# Patient Record
Sex: Male | Born: 1980
Health system: Southern US, Community
[De-identification: ages and names within clinical notes are randomized; demographics above are authoritative.]

## PROBLEM LIST (undated history)

## (undated) DIAGNOSIS — K922 Gastrointestinal hemorrhage, unspecified: Secondary | ICD-10-CM

## (undated) DIAGNOSIS — Z9289 Personal history of other medical treatment: Secondary | ICD-10-CM

## (undated) DIAGNOSIS — I219 Acute myocardial infarction, unspecified: Secondary | ICD-10-CM

## (undated) DIAGNOSIS — S065X9A Traumatic subdural hemorrhage with loss of consciousness of unspecified duration, initial encounter: Secondary | ICD-10-CM

## (undated) DIAGNOSIS — I2510 Atherosclerotic heart disease of native coronary artery without angina pectoris: Secondary | ICD-10-CM

## (undated) DIAGNOSIS — R519 Headache, unspecified: Secondary | ICD-10-CM

## (undated) DIAGNOSIS — E785 Hyperlipidemia, unspecified: Secondary | ICD-10-CM

## (undated) DIAGNOSIS — I10 Essential (primary) hypertension: Secondary | ICD-10-CM

## (undated) DIAGNOSIS — Z95810 Presence of automatic (implantable) cardiac defibrillator: Secondary | ICD-10-CM

## (undated) DIAGNOSIS — N200 Calculus of kidney: Secondary | ICD-10-CM

## (undated) DIAGNOSIS — I509 Heart failure, unspecified: Secondary | ICD-10-CM

## (undated) HISTORY — DX: Hyperlipidemia, unspecified: E78.5

## (undated) HISTORY — DX: Traumatic subdural hemorrhage with loss of consciousness of unspecified duration, initial encounter: S06.5X9A

## (undated) HISTORY — PX: CARDIAC CATHETERIZATION: SHX172

## (undated) HISTORY — DX: Gastrointestinal hemorrhage, unspecified: K92.2

## (undated) HISTORY — DX: Essential (primary) hypertension: I10

## (undated) HISTORY — PX: CARDIAC SURGERY: SHX584

## (undated) HISTORY — DX: Calculus of kidney: N20.0

## (undated) HISTORY — DX: Atherosclerotic heart disease of native coronary artery without angina pectoris: I25.10

---

## 2001-03-10 ENCOUNTER — Emergency Department (HOSPITAL_COMMUNITY): Admission: EM | Admit: 2001-03-10 | Discharge: 2001-03-10 | Payer: Self-pay | Admitting: Emergency Medicine

## 2004-07-05 ENCOUNTER — Emergency Department (HOSPITAL_COMMUNITY): Admission: EM | Admit: 2004-07-05 | Discharge: 2004-07-05 | Payer: Self-pay | Admitting: Emergency Medicine

## 2004-08-25 ENCOUNTER — Ambulatory Visit: Payer: Self-pay | Admitting: Family Medicine

## 2005-05-09 ENCOUNTER — Ambulatory Visit: Payer: Self-pay | Admitting: Family Medicine

## 2006-01-10 ENCOUNTER — Emergency Department (HOSPITAL_COMMUNITY): Admission: EM | Admit: 2006-01-10 | Discharge: 2006-01-11 | Payer: Self-pay | Admitting: Emergency Medicine

## 2009-09-09 ENCOUNTER — Emergency Department (HOSPITAL_COMMUNITY): Admission: EM | Admit: 2009-09-09 | Discharge: 2009-09-09 | Payer: Self-pay | Admitting: Family Medicine

## 2010-03-30 ENCOUNTER — Inpatient Hospital Stay (HOSPITAL_COMMUNITY): Admission: EM | Admit: 2010-03-30 | Discharge: 2010-03-31 | Payer: Self-pay | Admitting: Emergency Medicine

## 2010-03-30 ENCOUNTER — Encounter: Payer: Self-pay | Admitting: Cardiology

## 2010-03-30 ENCOUNTER — Ambulatory Visit: Payer: Self-pay | Admitting: Cardiology

## 2010-03-30 HISTORY — PX: OTHER SURGICAL HISTORY: SHX169

## 2010-04-07 ENCOUNTER — Ambulatory Visit: Payer: Self-pay | Admitting: Cardiology

## 2010-04-07 DIAGNOSIS — I2510 Atherosclerotic heart disease of native coronary artery without angina pectoris: Secondary | ICD-10-CM | POA: Insufficient documentation

## 2010-04-07 LAB — CONVERTED CEMR LAB
BUN: 10 mg/dL (ref 6–23)
Calcium: 9.7 mg/dL (ref 8.4–10.5)
Chloride: 105 meq/L (ref 96–112)

## 2010-04-10 ENCOUNTER — Ambulatory Visit: Payer: Self-pay | Admitting: Cardiology

## 2010-04-10 ENCOUNTER — Telehealth (INDEPENDENT_AMBULATORY_CARE_PROVIDER_SITE_OTHER): Payer: Self-pay | Admitting: *Deleted

## 2010-04-11 LAB — CONVERTED CEMR LAB
BUN: 12 mg/dL (ref 6–23)
CO2: 29 meq/L (ref 19–32)
Calcium: 9.6 mg/dL (ref 8.4–10.5)
Creatinine, Ser: 0.8 mg/dL (ref 0.4–1.5)
Glucose, Bld: 91 mg/dL (ref 70–99)

## 2010-04-13 ENCOUNTER — Ambulatory Visit: Payer: Self-pay | Admitting: Cardiology

## 2010-04-14 ENCOUNTER — Telehealth: Payer: Self-pay | Admitting: Cardiology

## 2010-04-19 ENCOUNTER — Telehealth: Payer: Self-pay | Admitting: Cardiology

## 2010-04-28 ENCOUNTER — Telehealth: Payer: Self-pay | Admitting: Cardiology

## 2010-05-02 ENCOUNTER — Telehealth (INDEPENDENT_AMBULATORY_CARE_PROVIDER_SITE_OTHER): Payer: Self-pay | Admitting: *Deleted

## 2010-05-09 ENCOUNTER — Telehealth: Payer: Self-pay | Admitting: Cardiology

## 2010-05-11 ENCOUNTER — Telehealth: Payer: Self-pay | Admitting: Cardiology

## 2010-05-15 DIAGNOSIS — S065X9A Traumatic subdural hemorrhage with loss of consciousness of unspecified duration, initial encounter: Secondary | ICD-10-CM

## 2010-05-15 DIAGNOSIS — S065XAA Traumatic subdural hemorrhage with loss of consciousness status unknown, initial encounter: Secondary | ICD-10-CM

## 2010-05-15 HISTORY — DX: Traumatic subdural hemorrhage with loss of consciousness status unknown, initial encounter: S06.5XAA

## 2010-05-15 HISTORY — DX: Traumatic subdural hemorrhage with loss of consciousness of unspecified duration, initial encounter: S06.5X9A

## 2010-05-22 ENCOUNTER — Ambulatory Visit: Payer: Self-pay | Admitting: Cardiology

## 2010-05-24 LAB — CONVERTED CEMR LAB
ALT: 34 units/L (ref 0–53)
AST: 21 units/L (ref 0–37)
Alkaline Phosphatase: 59 units/L (ref 39–117)
Bilirubin, Direct: 0.1 mg/dL (ref 0.0–0.3)
Total Bilirubin: 0.3 mg/dL (ref 0.3–1.2)

## 2010-06-15 DIAGNOSIS — K922 Gastrointestinal hemorrhage, unspecified: Secondary | ICD-10-CM

## 2010-06-15 HISTORY — DX: Gastrointestinal hemorrhage, unspecified: K92.2

## 2010-06-26 ENCOUNTER — Ambulatory Visit: Payer: Self-pay | Admitting: Cardiovascular Disease

## 2010-06-26 ENCOUNTER — Encounter: Payer: Self-pay | Admitting: Cardiology

## 2010-06-26 ENCOUNTER — Ambulatory Visit (HOSPITAL_COMMUNITY): Admission: RE | Admit: 2010-06-26 | Discharge: 2010-06-26 | Payer: Self-pay | Admitting: Cardiovascular Disease

## 2010-06-26 ENCOUNTER — Ambulatory Visit: Payer: Self-pay | Admitting: Cardiology

## 2010-06-29 ENCOUNTER — Telehealth: Payer: Self-pay | Admitting: Cardiology

## 2010-07-08 ENCOUNTER — Encounter: Payer: Self-pay | Admitting: Cardiology

## 2010-07-13 ENCOUNTER — Ambulatory Visit: Payer: Self-pay | Admitting: Cardiology

## 2010-07-13 LAB — CONVERTED CEMR LAB
Basophils Absolute: 0.1 10*3/uL (ref 0.0–0.1)
Eosinophils Absolute: 0.4 10*3/uL (ref 0.0–0.7)
Eosinophils Relative: 4.6 % (ref 0.0–5.0)
HCT: 39.5 % (ref 39.0–52.0)
Hemoglobin: 13.7 g/dL (ref 13.0–17.0)
MCHC: 34.6 g/dL (ref 30.0–36.0)
Neutro Abs: 5 10*3/uL (ref 1.4–7.7)
Platelets: 399 10*3/uL (ref 150.0–400.0)
RBC: 4.45 M/uL (ref 4.22–5.81)
RDW: 15 % — ABNORMAL HIGH (ref 11.5–14.6)
WBC: 8.4 10*3/uL (ref 4.5–10.5)

## 2010-08-14 ENCOUNTER — Encounter: Payer: Self-pay | Admitting: Cardiology

## 2010-08-17 ENCOUNTER — Telehealth: Payer: Self-pay | Admitting: Cardiology

## 2010-08-23 ENCOUNTER — Telehealth: Payer: Self-pay | Admitting: Cardiology

## 2010-08-24 ENCOUNTER — Encounter: Payer: Self-pay | Admitting: Cardiology

## 2010-09-14 ENCOUNTER — Telehealth: Payer: Self-pay | Admitting: Cardiology

## 2010-11-14 NOTE — Progress Notes (Signed)
Summary: Effient assistance program  Phone Note Outgoing Call   Call placed by: Katina Dung, RN, BSN,  April 19, 2010 4:51 PM Call placed to: Patient Summary of Call: Effient pt assistance program  Follow-up for Phone Call        I received income information on pt faxed from [employer] (pt states his mother works at [employer] [place] and he thinks she faxed the information to our office instead of [employer])--I called [employer] to check the status of the pt's application for Effient--I was told by Jillyn Hidden at [employer] that they still needed pt's income information--I was told to fax the income  information to [phone] attn: Issac-I talked with pt by telephone and got his permission to fax the income information to [employer]--I have faxed the pt's income information to [phone] attn:Issac
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
## 2010-11-14 NOTE — Letter (Signed)
Summary: [place] Medicaid & Health Choice  [place] Medicaid & Health Choice   Imported By: Marylou Mccoy 09/12/2010 14:55:54  _____________________________________________________________________  External Attachment:    Type:   Image     Comment:   External Document

## 2010-11-14 NOTE — Progress Notes (Signed)
Summary: patient assistance form  Phone Note From Other Clinic   Caller: Marcello Moores Summary of Call: Per Marcello Moores from [facility] patient assistance concering pt effient [phone] refer # [phone] Initial call taken by: Edman Circle,  April 14, 2010 12:16 PM  Follow-up for Phone Call        Spoke with a rep. from [facility] patient assistance program for Effient. Pt. needs to fax a pruff of income to them. I called pt . Patient states he spoke the  [place] rep. a few minutes earlier. Information needed was faxed to them  by  pt. Follow-up by: Ollen Gross, RN, BSN,  April 14, 2010 12:46 PM

## 2010-11-14 NOTE — Letter (Signed)
Summary: Cardiac Rehab Program  Cardiac Rehab Program   Imported By: Marylou Mccoy 09/15/2010 12:47:38  _____________________________________________________________________  External Attachment:    Type:   Image     Comment:   External Document

## 2010-11-14 NOTE — Progress Notes (Signed)
Summary: Effient 10mg  #30 x4  Phone Note Outgoing Call   Call placed by: Katina Dung, RN, BSN,  September 14, 2010 2:14 PM Call placed to: Patient Summary of Call: Effient   Follow-up for Phone Call        recieved Effient from [facility]--Effient 10mg  #30 tabs x 4 bottles--LMTCB for wife --medication left at front desk for pt pickup--I talked with pt --he is aware Effient at front desk for him to pickup [employer]

## 2010-11-14 NOTE — Miscellaneous (Signed)
Summary: [employer] Release Form   Imported By: Roderic Ovens 05/25/2010 15:44:34  _____________________________________________________________________  External Attachment:    Type:   Image     Comment:   External Document
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
## 2010-11-14 NOTE — Progress Notes (Signed)
  Pt's wife Larita Fife) brought in paper from [employer], I have sent to [facility] for processing. Cala Bradford Mesiemore  May 02, 2010 10:32 AM     Appended Document:  Luster Landsberg Optometrist) is going to complete the [employer] papers and send back with Almira Coaster, I called Mr.Krog and let him know the paper may not be ready until tomorrow. He ok'd will call and let hom know when ready.  Appended Document:  Called Pt @ 9:45 am today to let him know Paperwork is ready for Pick-up.( [employer] papers,[employer]) he ok'd   Appended Document:  Pt picked up paperwork..Km  Appended Document:  Pt wife called back with Master card # to pay fee for [employer].sending paper over to [facility])

## 2010-11-14 NOTE — Progress Notes (Signed)
Summary: pt reurning call  Phone Note Call from Patient Call back at [phone]   Caller: Spouse/Lynn Summary of Call: Pt returning call Initial call taken by: Judie Grieve,  June 29, 2010 2:30 PM  Follow-up for Phone Call        talked with wife Larita Fife about echo results

## 2010-11-14 NOTE — Progress Notes (Signed)
Summary: rtn to work letter  Phone Note Call from Patient   Caller: Spouse Reason for Call: Talk to Nurse Summary of Call: pt needs letter from [place] stating pt was released back to work on 05-01-10 with no retrictions-att:dr [employer] [employer]  fax [phone] Initial call taken by: Glynda Jaeger,  May 11, 2010 9:38 AM  Follow-up for Phone Call        Faxed note as requested. Follow-up by: J REISS RN     Appended Document: rtn to work letter Let's get this to him.

## 2010-11-14 NOTE — Letter (Signed)
Summary: Generic Letter  Architectural technologist, Main Office  [address]   [place], [place] [address]   Phone: [phone]  Fax: [phone]        April 13, 2010 MRN: [id number]    [name] [address] [place], [place]  [address]    To Whom It May Concern:  [name] can return to work July 18,2011 without restrictions.         Sincerely,     Nel Stoneking,MD  This letter has been electronically signed by your physician.  Appended Document: Generic Letter Faxed note to [employer] to  [phone].

## 2010-11-14 NOTE — Progress Notes (Signed)
Summary: SHort term disability form  Phone Note Other Incoming      Patient completed walk in patient form. Received from front desk. Forwarded to [employer] for processing.

## 2010-11-14 NOTE — Letter (Signed)
Summary: [facility] Patient Asisstance Program  [facility] Patient Asisstance [facility]   Imported By: Marylou Mccoy 09/12/2010 14:59:39  _____________________________________________________________________  External Attachment:    Type:   Image     Comment:   External Document

## 2010-11-14 NOTE — Progress Notes (Signed)
Summary: effient   Phone Note Outgoing Call   Call placed by: Meredith Staggers, RN,  April 28, 2010 4:22 PM Call placed to: Patient Summary of Call: received pts Effient from [employer], called pt and left mess on ID VM that it had arrived and would be at front desk for pt to p/u

## 2010-11-14 NOTE — Progress Notes (Signed)
Summary: CHECKING ON PAPER WORK  Phone Note Call from Patient Call back at Home Phone [phone] Call back at [phone]   Caller: Spouse/ lYNN Summary of Call: PT WIFE CHECKING PAPER WORK Initial call taken by: Judie Grieve,  May 09, 2010 9:33 AM  Follow-up for Phone Call        talked with [facility] signed by Dr Shirlee Latch 05/03/10--forwarded wife to medical records to discuss picking up a copy of the paperwork

## 2010-11-14 NOTE — Letter (Signed)
Summary: [facility]   [facility]   Imported By: Roderic Ovens 07/25/2010 09:07:53  _____________________________________________________________________  External Attachment:    Type:   Image     Comment:   External Document

## 2010-11-14 NOTE — Assessment & Plan Note (Signed)
Summary: eph/jss   CC:  eph/pt had MI.  Pt and spouse report that his energy levels have been very low lately and he gives out easily.  Marland Kitchen  History of Present Illness: 30 yo with history of recent inferior MI presents for followup.  Patient went to the ER in 6/11 with chest pain.  He was noted to have inferior MI and underwent cath showing totally occluded RCA with long 70% LAD stenosis.  Patient had PCI with 2 PROMUS DES, one to the LAD and the other to the RCA.  Since discharge, he has been doing reasonably well with no recurrent chest pain.  He is walking for exercise.  He does get short of breath after walking about 10-15 minutes.  He does not want to do cardiac rehab.  No orthopnea/PND.  He was bradycardic in the hospital and was not started on a beta blocker.  HR today, however, is in the 70s.  He plans to get married in a week and wants to go back to work in about 2 weeks after his honeymoon.  He is still smoking about 1 ppd but is cutting back (2 ppd prior).   ECG: NSR, old inferior MI  Labs (6/11): K 5.2, creatinine 0.8, LDL 98, HDL 32  Current Medications (verified): 1)  Nitrostat 0.4 Mg Subl (Nitroglycerin) .... Uad 2)  Buffered Aspirin 325 Mg Tabs (Aspirin Buf(Cacarb-Mgcarb-Mgo)) .... Take One Tablet Once Daily 3)  Effient 10 Mg Tabs (Prasugrel Hcl) .... Take One Tablet Once Daily 4)  Lisinopril 5 Mg Tabs (Lisinopril) .... Take 1/2 Tablet Once A Day 5)  Crestor 40 Mg Tabs (Rosuvastatin Calcium) .... Take One Tablet Once Daily  Allergies (verified): 1)  ! Pcn  Past History:  Past Medical History: 1. 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 LHC  with EF 55% and basal to mid inferior hypokinesis.  Totally occluded RCA with weak left to right collaterals.  50% ostial OM1.  Long 70% proximal LAD.  Patient had PROMUS DES to LAD and RCA.  2. Active smoker.   Family History: Noncontributory for early coronary artery disease or myocardial infarction.  His father does have a pacemaker.   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 Now about 1 ppd.  Lives in [place].  Alcohol Use - yes-occasional No illegal drugs.   Review of Systems       All systems reviewed and negative except as per HPI.   Vital Signs:  Patient profile:   30 year old male Height:      69 inches Weight:      231 pounds BMI:     34.24 Pulse rate:   74 / minute Pulse rhythm:   regular BP sitting:   128 / 74  (left arm) Cuff size:   large  Vitals Entered By: Judithe Modest CMA (April 13, 2010 1:43 PM)  Physical Exam  General:  Well developed, well nourished, in no acute distress. Head:  normocephalic and atraumatic Nose:  no deformity, discharge, inflammation, or lesions Mouth:  Teeth, gums and palate normal. Oral mucosa normal. Neck:  Neck supple, no JVD. No masses, thyromegaly or abnormal cervical nodes. Lungs:  Clear bilaterally to auscultation and percussion. Heart:  Non-displaced PMI, chest non-tender; regular rate and rhythm, S1, S2 without murmurs, rubs or gallops. Carotid upstroke normal, no bruit. Pedals normal pulses. No edema, no varicosities. Abdomen:  Bowel sounds positive; abdomen soft and non-tender without masses, organomegaly, or hernias noted. No  hepatosplenomegaly. Msk:  Back normal, normal gait. Muscle strength and tone normal. Extremities:  No clubbing or cyanosis. Neurologic:  Alert and oriented x 3. Skin:  Intact without lesions or rashes. Psych:  Normal affect.   Impression & Recommendations:  Problem # 1:  CORONARY ATHEROSCLEROSIS NATIVE CORONARY ARTERY (ICD-414.01) Stable s/p inferior MI.  Still with some exertional dyspnea but no chest pain.  His early age with MI is difficult to explain.  He does smoke but does not have a premature family history of CAD and cholesterol was not high. Drug screen was negative.  Continue ASA, Effient, Crestor, lisinopril.  - Continue exercise.  - OK to return to work in  2 weeks.  - WIth HR in 70s, will start low dose Coreg (3.125 mg two times a day).  - Lipids/LFTs in 6 weeks.  - Echo in 2 months (EF was normal on LV-gram).   Problem # 2:  SMOKING Patient was strongly encouraged to quit.  He is cutting back and wants to do it on his own without pharmacological aid.    Other Orders: EKG w/ Interpretation (93000) Echocardiogram (Echo)  Patient Instructions: 1)  Your physician has recommended you make the following change in your medication:  2)  Start Coreg(carvedilol) 3.125mg  twice a  day 3)  Your physician recommends that you return for a FASTING lipid profile/liver profile in 6 weeks--414.01 4)  Your physician has requested that you have an echocardiogram.  Echocardiography is a painless test that uses sound waves to create images of your heart. It provides your doctor with information about the size and shape of your heart and how well your heart's chambers and valves are working.  This procedure takes approximately one hour. There are no restrictions for this procedure. IN 2 MONTHS 5)  Your physician recommends that you schedule a follow-up appointment in: 2 months with  Dr Shirlee Latch after the echo has been done in 2 months. Prescriptions: COREG 3.125 MG TABS (CARVEDILOL) one twice a day  #60 x 11   Entered by:   Katina Dung, RN, BSN   Authorized by:   Marca Ancona, MD   Signed by:   Katina Dung, RN, BSN on 04/13/2010   Method used:   Electronically to        [facility]  [phone]* (retail)       [address]       [place], [place]  [address]       Ph: [phone] or [phone]       Fax: [phone]   RxID:   [id number]

## 2010-11-14 NOTE — Progress Notes (Signed)
Summary: restricition in details for work**rtn call w/fax**  Phone Note Call from Patient Call back at Home Phone [phone]   Caller: Norma Fredrickson [id number] Reason for Call: Talk to Nurse Summary of Call: pt need a letter stating his restriction in details  for work. pt went to work on 7/18.  Initial call taken by: Lorne Skeens,  August 17, 2010 9:58 AM  Follow-up for Phone Call        spouse to get fax number for pt work and will refax previous letter.   letter faxed. Whitney Maeola Sarah RN  August 17, 2010 3:21 PM  Follow-up by: Claris Gladden RN,  August 17, 2010 10:05 AM  Additional Follow-up for Phone Call Additional follow up Details #1::        rtn call fax#[phone] Attn: [facility] & Guinevere Scarlet  August 17, 2010 2:28 PM

## 2010-11-30 ENCOUNTER — Inpatient Hospital Stay (HOSPITAL_COMMUNITY)
Admission: EM | Admit: 2010-11-30 | Discharge: 2010-12-03 | DRG: 551 | Disposition: A | Discharge status: Home / Self Care | Payer: BC Managed Care – PPO | Attending: Internal Medicine | Admitting: Internal Medicine

## 2010-11-30 ENCOUNTER — Encounter: Payer: Self-pay | Admitting: Cardiology

## 2010-11-30 DIAGNOSIS — K228 Other specified diseases of esophagus: Secondary | ICD-10-CM | POA: Diagnosis present

## 2010-11-30 DIAGNOSIS — K2289 Other specified disease of esophagus: Secondary | ICD-10-CM | POA: Diagnosis present

## 2010-11-30 DIAGNOSIS — K208 Other esophagitis without bleeding: Principal | ICD-10-CM | POA: Diagnosis present

## 2010-11-30 DIAGNOSIS — I252 Old myocardial infarction: Secondary | ICD-10-CM

## 2010-11-30 DIAGNOSIS — Z7982 Long term (current) use of aspirin: Secondary | ICD-10-CM

## 2010-11-30 DIAGNOSIS — F17200 Nicotine dependence, unspecified, uncomplicated: Secondary | ICD-10-CM | POA: Diagnosis present

## 2010-11-30 DIAGNOSIS — D62 Acute posthemorrhagic anemia: Secondary | ICD-10-CM | POA: Diagnosis present

## 2010-11-30 DIAGNOSIS — I2510 Atherosclerotic heart disease of native coronary artery without angina pectoris: Secondary | ICD-10-CM | POA: Diagnosis present

## 2010-11-30 DIAGNOSIS — E875 Hyperkalemia: Secondary | ICD-10-CM | POA: Diagnosis present

## 2010-11-30 DIAGNOSIS — Z7902 Long term (current) use of antithrombotics/antiplatelets: Secondary | ICD-10-CM

## 2010-11-30 DIAGNOSIS — K219 Gastro-esophageal reflux disease without esophagitis: Secondary | ICD-10-CM | POA: Diagnosis present

## 2010-11-30 DIAGNOSIS — Z87820 Personal history of traumatic brain injury: Secondary | ICD-10-CM

## 2010-11-30 DIAGNOSIS — Z880 Allergy status to penicillin: Secondary | ICD-10-CM

## 2010-11-30 DIAGNOSIS — Z9861 Coronary angioplasty status: Secondary | ICD-10-CM

## 2010-11-30 LAB — COMPREHENSIVE METABOLIC PANEL
ALT: 21 U/L (ref 0–53)
Albumin: 3.8 g/dL (ref 3.5–5.2)
Alkaline Phosphatase: 47 U/L (ref 39–117)
CO2: 17 mEq/L — ABNORMAL LOW (ref 19–32)
Calcium: 8.8 mg/dL (ref 8.4–10.5)
GFR calc Af Amer: >60 mL/min (ref >60)
GFR calc non Af Amer: >60 mL/min (ref >60)
Total Protein: 6.8 g/dL (ref 6.0–8.3)

## 2010-11-30 LAB — CBC
HCT: 39.5 % (ref 39.0–52.0)
HCT: 35.2 % — ABNORMAL LOW (ref 39.0–52.0)
Hemoglobin: 14 g/dL (ref 13.0–17.0)
MCH: 30.4 pg (ref 26.0–34.0)
MCH: 29.8 pg (ref 26.0–34.0)
MCHC: 35.4 g/dL (ref 30.0–36.0)
MCHC: 34.7 g/dL (ref 30.0–36.0)
MCV: 86.1 fL (ref 78.0–100.0)
Platelets: 408 10*3/uL — ABNORMAL HIGH (ref 150–400)
Platelets: 336 10*3/uL (ref 150–400)
RDW: 13.2 % (ref 11.5–15.5)

## 2010-11-30 LAB — TROPONIN I: Troponin I: 0.05 ng/mL (ref 0.00–0.06)

## 2010-11-30 LAB — BASIC METABOLIC PANEL
CO2: 23 mEq/L (ref 19–32)
Calcium: 8.5 mg/dL (ref 8.4–10.5)
Chloride: 106 mEq/L (ref 96–112)
GFR calc Af Amer: >60 mL/min (ref >60)
Potassium: 4.9 mEq/L (ref 3.5–5.1)
Sodium: 136 mEq/L (ref 135–145)

## 2010-11-30 LAB — OCCULT BLOOD, POC DEVICE: Fecal Occult Bld: POSITIVE

## 2010-11-30 LAB — DIFFERENTIAL
Eosinophils Relative: 1 % (ref 0–5)
Monocytes Absolute: 0.9 10*3/uL (ref 0.1–1.0)
Monocytes Relative: 6 % (ref 3–12)
Neutro Abs: 11 10*3/uL — ABNORMAL HIGH (ref 1.7–7.7)

## 2010-11-30 LAB — POCT I-STAT, CHEM 8
BUN: 48 mg/dL — ABNORMAL HIGH (ref 6–23)
Chloride: 110 mEq/L (ref 96–112)

## 2010-11-30 LAB — CK TOTAL AND CKMB (NOT AT ARMC): Relative Index: 1.1 (ref 0.0–2.5)

## 2010-11-30 LAB — ABO/RH: ABO/RH(D): O POS

## 2010-12-01 LAB — CARDIAC PANEL(CRET KIN+CKTOT+MB+TROPI)
CK, MB: 1.7 ng/mL (ref 0.3–4.0)
CK, MB: 1.5 ng/mL (ref 0.3–4.0)
Relative Index: 0.9 (ref 0.0–2.5)
Relative Index: 1 (ref 0.0–2.5)
Total CK: 153 U/L (ref 7–232)

## 2010-12-01 LAB — BASIC METABOLIC PANEL
BUN: 20 mg/dL (ref 6–23)
CO2: 25 mEq/L (ref 19–32)
GFR calc Af Amer: >60 mL/min (ref >60)
Glucose, Bld: 93 mg/dL (ref 70–99)
Potassium: 4.4 mEq/L (ref 3.5–5.1)
Sodium: 138 mEq/L (ref 135–145)

## 2010-12-01 LAB — CBC
MCH: 29.4 pg (ref 26.0–34.0)
RDW: 13.4 % (ref 11.5–15.5)
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
## 2010-12-02 LAB — CBC
MCH: 30.9 pg (ref 26.0–34.0)
Platelets: 268 10*3/uL (ref 150–400)
RBC: 2.75 MIL/uL — ABNORMAL LOW (ref 4.22–5.81)
RDW: 13.2 % (ref 11.5–15.5)

## 2010-12-03 LAB — CROSSMATCH
Unit division: 0
Unit division: 0

## 2010-12-03 LAB — CBC
HCT: 29.2 % — ABNORMAL LOW (ref 39.0–52.0)
MCHC: 34.2 g/dL (ref 30.0–36.0)
Platelets: 242 10*3/uL (ref 150–400)
RDW: 13.7 % (ref 11.5–15.5)
WBC: 8 10*3/uL (ref 4.0–10.5)
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
## 2010-12-25 ENCOUNTER — Ambulatory Visit: Payer: BC Managed Care – PPO | Admitting: Cardiology

## 2010-12-26 NOTE — Consult Note (Signed)
Summary: [facility] Consultation Report  [facility] Consultation Report   Imported By: Earl Many 12/18/2010 17:17:49  _____________________________________________________________________  External Attachment:    Type:   Image     Comment:   External Document

## 2010-12-31 LAB — RAPID URINE DRUG SCREEN, HOSP PERFORMED
Amphetamines: NOT DETECTED
Cocaine: NOT DETECTED
Opiates: POSITIVE — AB
Tetrahydrocannabinol: NOT DETECTED

## 2010-12-31 LAB — CARDIAC PANEL(CRET KIN+CKTOT+MB+TROPI)
CK, MB: 14.4 ng/mL (ref 0.3–4.0)
CK, MB: 22.4 ng/mL (ref 0.3–4.0)
CK, MB: 30.8 ng/mL (ref 0.3–4.0)
Relative Index: 6.9 — ABNORMAL HIGH (ref 0.0–2.5)
Relative Index: 7.9 — ABNORMAL HIGH (ref 0.0–2.5)
Total CK: 243 U/L — ABNORMAL HIGH (ref 7–232)
Total CK: 324 U/L — ABNORMAL HIGH (ref 7–232)
Troponin I: 3.46 ng/mL (ref 0.00–0.06)

## 2010-12-31 LAB — POCT CARDIAC MARKERS: Myoglobin, poc: 287 ng/mL (ref 12–200)

## 2010-12-31 LAB — DIFFERENTIAL
Eosinophils Absolute: 0.3 10*3/uL (ref 0.0–0.7)
Eosinophils Relative: 3 % (ref 0–5)
Lymphocytes Relative: 28 % (ref 12–46)
Lymphs Abs: 2.7 10*3/uL (ref 0.7–4.0)
Monocytes Relative: 8 % (ref 3–12)

## 2010-12-31 LAB — CBC
HCT: 38.3 % — ABNORMAL LOW (ref 39.0–52.0)
HCT: 42.5 % (ref 39.0–52.0)
Hemoglobin: 13.2 g/dL (ref 13.0–17.0)
Hemoglobin: 14.6 g/dL (ref 13.0–17.0)
MCV: 90.1 fL (ref 78.0–100.0)
Platelets: 254 10*3/uL (ref 150–400)
RBC: 4.26 MIL/uL (ref 4.22–5.81)
RDW: 13.3 % (ref 11.5–15.5)
WBC: 13.9 10*3/uL — ABNORMAL HIGH (ref 4.0–10.5)
WBC: 9.7 10*3/uL (ref 4.0–10.5)

## 2010-12-31 LAB — BASIC METABOLIC PANEL
Calcium: 8.3 mg/dL — ABNORMAL LOW (ref 8.4–10.5)
Calcium: 8.5 mg/dL (ref 8.4–10.5)
GFR calc Af Amer: >60 mL/min (ref >60)
GFR calc non Af Amer: >60 mL/min (ref >60)
GFR calc non Af Amer: >60 mL/min (ref >60)
Glucose, Bld: 121 mg/dL — ABNORMAL HIGH (ref 70–99)
Potassium: 3.4 mEq/L — ABNORMAL LOW (ref 3.5–5.1)
Potassium: 3.8 mEq/L (ref 3.5–5.1)
Sodium: 136 mEq/L (ref 135–145)
Sodium: 137 mEq/L (ref 135–145)

## 2010-12-31 LAB — LIPID PANEL
HDL: 32 mg/dL — ABNORMAL LOW (ref >39)
LDL Cholesterol: 98 mg/dL (ref 0–99)
Total CHOL/HDL Ratio: 5.1 RATIO
VLDL: 33 mg/dL (ref 0–40)

## 2010-12-31 LAB — CK TOTAL AND CKMB (NOT AT ARMC)
CK, MB: 15.4 ng/mL (ref 0.3–4.0)
Relative Index: 6.6 — ABNORMAL HIGH (ref 0.0–2.5)

## 2010-12-31 LAB — TROPONIN I: Troponin I: 0.56 ng/mL (ref 0.00–0.06)

## 2010-12-31 LAB — C-REACTIVE PROTEIN: CRP: 0.4 mg/dL — ABNORMAL LOW (ref <0.6)
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
## 2011-01-30 ENCOUNTER — Encounter: Payer: Self-pay | Admitting: Cardiology

## 2011-03-30 ENCOUNTER — Telehealth: Payer: Self-pay | Admitting: Cardiology

## 2011-03-30 NOTE — Telephone Encounter (Signed)
Per pt wife calling would like to be set up for stress test.

## 2011-03-30 NOTE — Telephone Encounter (Signed)
Have him come to office.  We will see if he needs a stress test and arrange.
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
## 2011-04-02 NOTE — Telephone Encounter (Signed)
LMTCB for pt's wife. 

## 2011-04-05 NOTE — Telephone Encounter (Signed)
I talked with pt. I have given pt an appt with Dr Shirlee Latch 05/01/11. Pt states his DOT physical is scheduled for 05/11/11.

## 2011-04-26 ENCOUNTER — Encounter: Payer: Self-pay | Admitting: Cardiology

## 2011-05-01 ENCOUNTER — Ambulatory Visit (INDEPENDENT_AMBULATORY_CARE_PROVIDER_SITE_OTHER): Payer: Self-pay | Admitting: Cardiology

## 2011-05-01 ENCOUNTER — Encounter: Payer: Self-pay | Admitting: Cardiology

## 2011-05-01 VITALS — BP 131/91 | HR 75 | Resp 16 | Ht 68.0 in | Wt 249.0 lb

## 2011-05-01 DIAGNOSIS — E785 Hyperlipidemia, unspecified: Secondary | ICD-10-CM

## 2011-05-01 DIAGNOSIS — K209 Esophagitis, unspecified without bleeding: Secondary | ICD-10-CM

## 2011-05-01 DIAGNOSIS — I2510 Atherosclerotic heart disease of native coronary artery without angina pectoris: Secondary | ICD-10-CM

## 2011-05-01 MED ORDER — CARVEDILOL 3.125 MG PO TABS: 3.1250 mg | ORAL_TABLET | Freq: Two times a day (BID) | ORAL | Status: DC

## 2011-05-01 MED ORDER — ATORVASTATIN CALCIUM 80 MG PO TABS: 80.0000 mg | ORAL_TABLET | Freq: Every day | ORAL | Status: DC
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
## 2011-05-01 NOTE — Patient Instructions (Signed)
Schedule an appointment for a treadmill stress test this week.  You do not need to refill Effient when you run out of your current prescription.  Start Aspirin 162mg  daily--this will be two 81mg  Aspirin daily.  Start Atorvastatin 80mg  daily.  Start Omeprazole 20mg  daily--you do not need a prescription for this.  Start Coreg(carvedilol) 3.125mg  twice a day.  Schedule an appointment for fasting lab in 2 months--lipid profile/liver profile/BMP 414.01  Schedule an appointment with a primary care doctor at [facility].  Your physician wants you to follow-up in: 6 months with Dr Shirlee Latch. (January 2013). You will receive a reminder letter in the mail two months in advance. If you don't receive a letter, please call our office to schedule the follow-up appointment.

## 2011-05-02 ENCOUNTER — Ambulatory Visit (INDEPENDENT_AMBULATORY_CARE_PROVIDER_SITE_OTHER): Payer: Self-pay | Admitting: Physician Assistant

## 2011-05-02 ENCOUNTER — Encounter (INDEPENDENT_AMBULATORY_CARE_PROVIDER_SITE_OTHER): Payer: Self-pay | Admitting: Physician Assistant

## 2011-05-02 DIAGNOSIS — E785 Hyperlipidemia, unspecified: Secondary | ICD-10-CM | POA: Insufficient documentation

## 2011-05-02 DIAGNOSIS — K209 Esophagitis, unspecified without bleeding: Secondary | ICD-10-CM | POA: Insufficient documentation

## 2011-05-02 DIAGNOSIS — I2510 Atherosclerotic heart disease of native coronary artery without angina pectoris: Secondary | ICD-10-CM

## 2011-05-02 NOTE — Assessment & Plan Note (Signed)
No recurrent GI bleeding since 2/12.  Esophagitis on EGD at that time.   As I am going to put him back on ASA at this point, I recommended that he start over the counter omeprazole.

## 2011-05-02 NOTE — Progress Notes (Signed)
Exercise Treadmill Test  Pre-Exercise Testing Evaluation Rhythm: normal sinus  Rate: 89   PR:  .16 QRS:  .09  QT:  .35 QTc: .43           Test  Exercise Tolerance Test Ordering MD: Marca Ancona, MD  Interpreting MD:  Tereso Newcomer, PA-C  Unique Test No: 1  Treadmill:  1  Indication for ETT: chest pain - rule out ischemia  Contraindication to ETT: No   Stress Modality: exercise - treadmill  Cardiac Imaging Performed: non   Protocol: standard Bruce - submaximal  Max BP:  165/42  Max MPHR (bpm):  190 85% MPR (bpm):  161  MPHR obtained (bpm):  179 % MPHR obtained:  94  Reached 85% MPHR (min:sec):  5:54 Total Exercise Time (min-sec):  6:56  Workload in METS:  11.3 Borg Scale: 17  Reason ETT Terminated:  patient's desire to stop    ST Segment Analysis At Rest: non-specific ST segment slurring With Exercise: no evidence of significant ST depression  Other Information Arrhythmia:  No Angina during ETT:  absent (0) Quality of ETT:  diagnostic  ETT Interpretation:  normal - no evidence of ischemia by ST analysis  Comments: Fair exercise tolerance. Normal BP response to exercise. No chest pain. No ST-T changes to suggest ischemia.  Recommendations: Follow up with Dr. Shirlee Latch as directed.

## 2011-05-02 NOTE — Assessment & Plan Note (Signed)
As above, starting atorvastatin 80 mg daily with lipids/LFTs in 2 months.

## 2011-05-02 NOTE — Assessment & Plan Note (Signed)
No ischemic symptoms, ECG normal.  Cost is a barrier to getting his cardiac meds.  He has been on Effient for over a year now.  I will let him stop Effient after he has run out of his current bottle.  He will start on ASA 162 mg daily after that (enteric-coated).  He will continue lisinopril and restart Coreg 3.125 mg bid. He needs to restart on a statin.  I will use atorvastatin 80 mg daily as Crestor was too expensive.  Finally, patient needs an ETT for DOT physical, which I will arrange today.

## 2011-05-07 ENCOUNTER — Telehealth: Payer: Self-pay | Admitting: Cardiology

## 2011-05-07 NOTE — Telephone Encounter (Signed)
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

## 2011-05-07 NOTE — Telephone Encounter (Signed)
Pt's wife would like to know if the Exercise tolerance test results is ready to peak up. Wife states pt was told it will be ready today. A copy of stress test placed at the front desk, wife aware.

## 2011-10-03 IMAGING — CR DG CHEST 1V PORT
1 series · 1 of 1 positions shown · non-contrast
Comparison: None.

CLINICAL DATA: Chest pain.

PORTABLE CHEST - 1 VIEW

[AP]
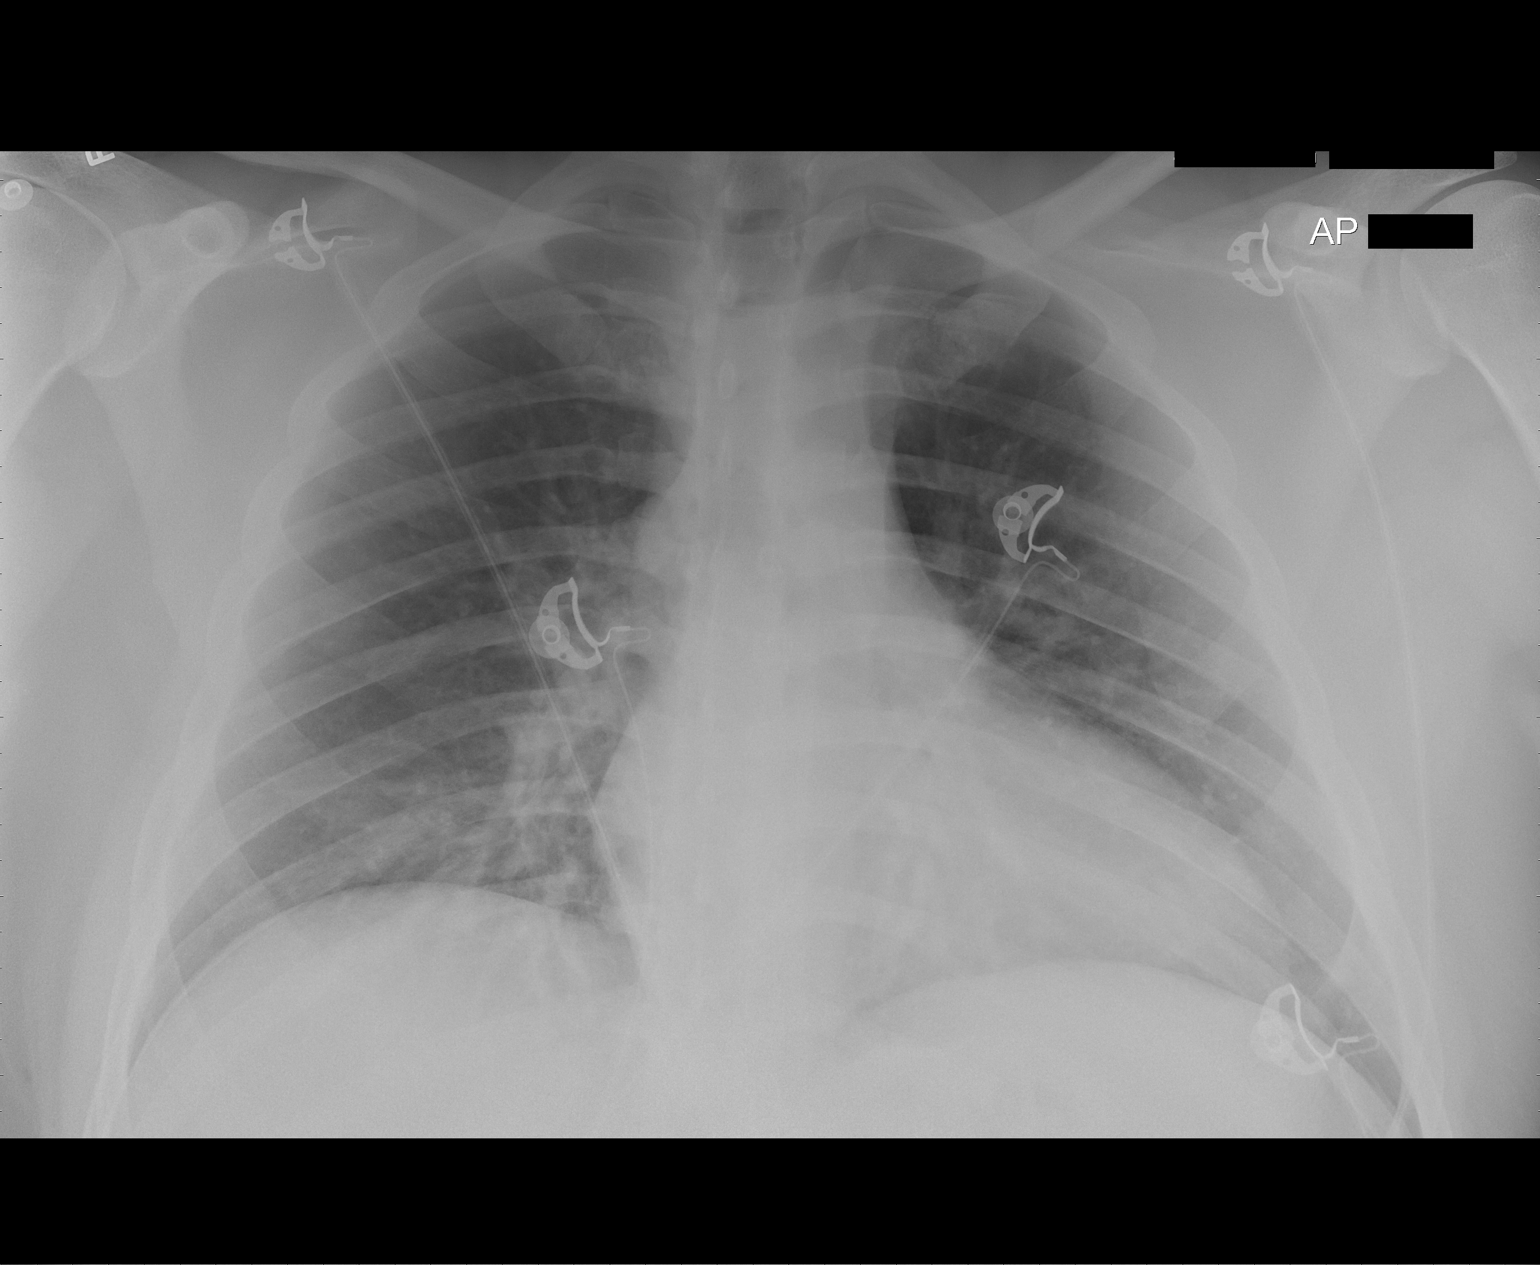

[1 of 1 positions shown; findings below may reference images not displayed]

FINDINGS: Trachea is midline.  Heart size is accentuated by AP
technique and somewhat low lung volumes.  Mild bibasilar
atelectasis.  No pleural fluid.
IMPRESSION: Mild bibasilar atelectasis.

## 2012-01-16 NOTE — Progress Notes (Signed)
This encounter was created in error - please disregard.

## 2012-04-08 ENCOUNTER — Emergency Department (INDEPENDENT_AMBULATORY_CARE_PROVIDER_SITE_OTHER)
Admission: EM | Admit: 2012-04-08 | Discharge: 2012-04-08 | Disposition: A | Discharge status: Home / Self Care | Payer: Self-pay | Source: Home / Self Care | Attending: Family Medicine | Admitting: Family Medicine

## 2012-04-08 ENCOUNTER — Encounter (HOSPITAL_COMMUNITY): Payer: Self-pay | Admitting: Emergency Medicine

## 2012-04-08 DIAGNOSIS — K047 Periapical abscess without sinus: Secondary | ICD-10-CM

## 2012-04-08 HISTORY — DX: Personal history of other medical treatment: Z92.89

## 2012-04-08 MED ORDER — IBUPROFEN 600 MG PO TABS: 600.0000 mg | ORAL_TABLET | Freq: Three times a day (TID) | ORAL | Status: DC

## 2012-04-08 MED ORDER — CLINDAMYCIN HCL 300 MG PO CAPS: 300.0000 mg | ORAL_CAPSULE | Freq: Four times a day (QID) | ORAL | Status: DC

## 2012-04-08 MED ORDER — HYDROCODONE-ACETAMINOPHEN 5-500 MG PO TABS: 1.0000 | ORAL_TABLET | Freq: Four times a day (QID) | ORAL | Status: AC | PRN

## 2012-04-08 NOTE — ED Provider Notes (Signed)
History     CSN: [id number]  Arrival date & time 04/08/12  1737   First MD Initiated Contact with Patient 04/08/12 1743      No chief complaint on file.   (Consider location/radiation/quality/duration/timing/severity/associated sxs/prior treatment) HPI Comments: 31 year old male current smoker with history of coronary artery disease. Here complaining of dental pain in right upper jaw for 2 days associated with face swelling over the right upper jaw that started today. No spontaneous drainage. Only medication he's taking currently is 81 mg aspirin daily. No fever or chills no nausea vomiting or diarrhea. Patient is allergic to penicillin.   Past Medical History  Diagnosis Date  . CAD (coronary artery disease)     inferiot MI; LHC with EF 55% and basal to mid inferior hypokinesis. totally occluded RCA w/weak L-R collaterals. 50% ostial OM1. long 70% proximal LAD. Pt had promus DES to LAD and RCA. echo 9/11: EF 55%, no regional wall motion abnormalities   . 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 GI bleed 9/11    secondary to mallory-weiss tear with endoscopic clipping     No past surgical history on file.  Family History  Problem Relation Age of Onset  . Coronary artery disease      noncontributory for early CAD and MI    History  Substance Use Topics  . Smoking status: Former Games developer  . Smokeless tobacco: Not on file   Comment: quit 9/11  . Alcohol Use: Yes     occasional       Review of Systems  Constitutional: Negative for fever and chills.  HENT: Positive for facial swelling and dental problem. Negative for congestion, sore throat, rhinorrhea, trouble swallowing, neck pain and neck stiffness.   Respiratory: Negative for shortness of breath.   Cardiovascular: Negative for chest pain, palpitations and leg swelling.  Gastrointestinal: Negative for nausea, vomiting and diarrhea.  Skin: Negative for rash.  Neurological:  Negative for dizziness and headaches.    Allergies  Penicillins  Home Medications   Current Outpatient Rx  Name Route Sig Dispense Refill  . ASPIRIN EC 81 MG PO TBEC  Take 162mg  daily--this will be two 81mg  tablets daily    . ATORVASTATIN CALCIUM 80 MG PO TABS Oral Take 1 tablet (80 mg total) by mouth daily. 30 tablet 3  . CARVEDILOL 3.125 MG PO TABS Oral Take 1 tablet (3.125 mg total) by mouth 2 (two) times daily. 60 tablet 6  . CLINDAMYCIN HCL 300 MG PO CAPS Oral Take 1 capsule (300 mg total) by mouth 4 (four) times daily. 28 capsule 0  . HYDROCODONE-ACETAMINOPHEN 5-500 MG PO TABS Oral Take 1 tablet by mouth every 6 (six) hours as needed for pain. 15 tablet 0  . IBUPROFEN 600 MG PO TABS Oral Take 1 tablet (600 mg total) by mouth 3 (three) times daily. 15 tablet 0  . LISINOPRIL 5 MG PO TABS Oral Take 2.5 mg by mouth daily.      Marland Kitchen NITROGLYCERIN 0.4 MG SL SUBL Sublingual Place 0.4 mg under the tongue every 5 (five) minutes as needed.      Marland Kitchen OMEPRAZOLE MAGNESIUM 20 MG PO TBEC  Take one daily    . ROSUVASTATIN CALCIUM 40 MG PO TABS Oral Take 40 mg by mouth daily.        BP 179/94  Pulse 70  Temp 99.2 F (37.3 C) (Oral)  Resp 18  SpO2 100%  Physical Exam  Nursing  note and vitals reviewed. Constitutional: He is oriented to person, place, and time. He appears well-developed and well-nourished. No distress.  HENT:  Head: Normocephalic and atraumatic.  Right Ear: External ear normal.  Left Ear: External ear normal.  Nose: Nose normal.  Mouth/Throat: No oropharyngeal exudate.       Poor dentition. With periodontal swelling, erythema and extensive dental plaque. There is a fractured molar in right upper maxilla with periapical swelling and exudates. Very tender with palpation of adjacent gum. There is associated mild to moderate focal face swelling above the affected tooth. No skin erythema. Affected face area is mildly tender to palpation.  Eyes: Conjunctivae and EOM are normal.  Pupils are equal, round, and reactive to light. Right eye exhibits no discharge. Left eye exhibits no discharge. No scleral icterus.  Neck: Normal range of motion. Neck supple.       No neck tenderness.  Cardiovascular: Normal rate, regular rhythm and normal heart sounds.  Exam reveals no gallop and no friction rub.   No murmur heard. Pulmonary/Chest: Effort normal and breath sounds normal. No respiratory distress. He has no wheezes. He has no rales.  Lymphadenopathy:    He has no cervical adenopathy.  Neurological: He is alert and oriented to person, place, and time.    ED Course  Procedures (including critical care time)  Labs Reviewed - No data to display No results found.   1. Dental abscess       MDM  Right side face swelling that started today related to dental abscess. Prescribed clindamycin, ibuprofen and Vicodin. Patient asked to see dentist tomorrow or go to the emergency department if persistence or worsening swelling despite following treatment.        Sharin Grave, MD 04/09/12 1342

## 2012-04-08 NOTE — ED Notes (Signed)
Initially triaged by dee, cma student.  Reviewed information

## 2012-04-08 NOTE — Discharge Instructions (Signed)
Is a my impression is that your face swelling is related to a dental abscess in your right upper jaw. Take the prescribed medications as instructed, starting today. Followup tomorrow with a dentist or go to the emergency department if persistent or worsening swelling or pain. Call number above if you need to find a dentist.

## 2012-04-08 NOTE — ED Notes (Signed)
Rt jaw swollen. Doctor told patient that it may be due to sinus, or tooth abscess.  Patient says that pain rage is 7. Pt says that he been feeling stomach sick from last past days. Patient takes excedrin to help relieve pain.

## 2012-04-09 ENCOUNTER — Emergency Department (HOSPITAL_BASED_OUTPATIENT_CLINIC_OR_DEPARTMENT_OTHER): Payer: Self-pay

## 2012-04-09 ENCOUNTER — Encounter (HOSPITAL_BASED_OUTPATIENT_CLINIC_OR_DEPARTMENT_OTHER): Payer: Self-pay | Admitting: Family Medicine

## 2012-04-09 ENCOUNTER — Emergency Department (HOSPITAL_BASED_OUTPATIENT_CLINIC_OR_DEPARTMENT_OTHER)
Admission: EM | Admit: 2012-04-09 | Discharge: 2012-04-09 | Disposition: A | Discharge status: Home / Self Care | Payer: Self-pay | Attending: Emergency Medicine | Admitting: Emergency Medicine

## 2012-04-09 DIAGNOSIS — K047 Periapical abscess without sinus: Secondary | ICD-10-CM | POA: Insufficient documentation

## 2012-04-09 DIAGNOSIS — I2510 Atherosclerotic heart disease of native coronary artery without angina pectoris: Secondary | ICD-10-CM | POA: Insufficient documentation

## 2012-04-09 DIAGNOSIS — L03211 Cellulitis of face: Secondary | ICD-10-CM

## 2012-04-09 DIAGNOSIS — Z87891 Personal history of nicotine dependence: Secondary | ICD-10-CM | POA: Insufficient documentation

## 2012-04-09 DIAGNOSIS — K029 Dental caries, unspecified: Secondary | ICD-10-CM

## 2012-04-09 DIAGNOSIS — L0201 Cutaneous abscess of face: Secondary | ICD-10-CM | POA: Insufficient documentation

## 2012-04-09 LAB — CBC WITH DIFFERENTIAL/PLATELET
Basophils Absolute: 0.1 10*3/uL (ref 0.0–0.1)
Basophils Relative: 1 % (ref 0–1)
HCT: 42.1 % (ref 39.0–52.0)
Hemoglobin: 14.6 g/dL (ref 13.0–17.0)
Lymphocytes Relative: 20 % (ref 12–46)
Monocytes Absolute: 1 10*3/uL (ref 0.1–1.0)
Monocytes Relative: 9 % (ref 3–12)
Neutro Abs: 7.1 10*3/uL (ref 1.7–7.7)
Neutrophils Relative %: 66 % (ref 43–77)
RDW: 13.9 % (ref 11.5–15.5)
WBC: 10.7 10*3/uL — ABNORMAL HIGH (ref 4.0–10.5)

## 2012-04-09 LAB — BASIC METABOLIC PANEL
CO2: 27 mEq/L (ref 19–32)
Chloride: 103 mEq/L (ref 96–112)
Creatinine, Ser: 0.9 mg/dL (ref 0.50–1.35)
GFR calc Af Amer: >90 mL/min (ref >90)
Potassium: 3.4 mEq/L — ABNORMAL LOW (ref 3.5–5.1)

## 2012-04-09 MED ORDER — IOHEXOL 300 MG/ML  SOLN
75.0000 mL | Freq: Once | INTRAMUSCULAR | Status: AC | PRN
  Administered 2012-04-09: 75 mL via INTRAVENOUS

## 2012-04-09 MED ORDER — OXYCODONE-ACETAMINOPHEN 5-325 MG PO TABS
2.0000 | ORAL_TABLET | Freq: Once | ORAL | Status: AC
  Administered 2012-04-09: 2 via ORAL
  Filled 2012-04-09: qty 2

## 2012-04-09 MED ORDER — MORPHINE SULFATE 4 MG/ML IJ SOLN
4.0000 mg | Freq: Once | INTRAMUSCULAR | Status: AC
  Administered 2012-04-09: 4 mg via INTRAVENOUS
  Filled 2012-04-09: qty 1

## 2012-04-09 MED ORDER — SODIUM CHLORIDE 0.9 % IV BOLUS (SEPSIS)
1000.0000 mL | Freq: Once | INTRAVENOUS | Status: AC
  Administered 2012-04-09: 1000 mL via INTRAVENOUS

## 2012-04-09 MED ORDER — METRONIDAZOLE 500 MG PO TABS: 500.0000 mg | ORAL_TABLET | Freq: Three times a day (TID) | ORAL | Status: AC

## 2012-04-09 MED ORDER — METRONIDAZOLE 500 MG PO TABS
500.0000 mg | ORAL_TABLET | Freq: Once | ORAL | Status: AC
  Administered 2012-04-09: 500 mg via ORAL
  Filled 2012-04-09: qty 1

## 2012-04-09 NOTE — Discharge Instructions (Signed)

## 2012-04-09 NOTE — ED Provider Notes (Signed)
History     CSN: [id number]  Arrival date & time 04/09/12  1200   First MD Initiated Contact with Patient 04/09/12 1237      Chief Complaint  Patient presents with  . Facial Swelling    (Consider location/radiation/quality/duration/timing/severity/associated sxs/prior treatment) HPI  Patient is a 31 year old male who presents today complaining of right-sided facial swelling. Patient was seen yesterday at urgent care for dental abscess and was started on clindamycin. Patient does have swelling over the right cheek which is overlying his area of dental caries. He has taken 3 doses of clindamycin since discharge. Patient reports his pain as an 8/10 and characterizes it as throbbing. He was given a prescription for Vicodin which has helped somewhat. Patient denies fevers, nausea, or vomiting. He is having no difficulty swallowing or breathing. Patient has swelling that is affecting his right lower eyelid that he denies any pain with extraocular movements or visual changes. Patient has not contacted a dentist he was referred to since discharge yesterday. He denies any history of diabetes or other immunocompromise. Pain is worse with eating and drinking. There are no other associated or modifying factors. Past Medical History  Diagnosis Date  . CAD (coronary artery disease)     inferiot MI; LHC with EF 55% and basal to mid inferior hypokinesis. totally occluded RCA w/weak L-R collaterals. 50% ostial OM1. long 70% proximal LAD. Pt had promus DES to LAD and RCA. echo 9/11: EF 55%, no regional wall motion abnormalities   . 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 GI bleed 9/11    secondary to mallory-weiss tear with endoscopic clipping   . Transfusion history 2011/2012    Past Surgical History  Procedure Date  . Cardiac surgery   . Patients states had two stent surgery to his heart 03/30/2010    Family History  Problem Relation Age of Onset  .  Coronary artery disease      noncontributory for early CAD and MI  . Rheum arthritis Father   . Cancer Mother   . Rheum arthritis Mother   . Diabetes Mother   . Hyperlipidemia Mother   . Hypertension Mother   . Stroke Other     History  Substance Use Topics  . Smoking status: Former Smoker -- 1.5 packs/day  . Smokeless tobacco: Not on file   Comment: quit 9/11  . Alcohol Use: Yes     occasional       Review of Systems  Constitutional: Negative.   HENT: Positive for facial swelling and dental problem.   Eyes: Negative.   Respiratory: Negative.   Cardiovascular: Negative.   Gastrointestinal: Negative.   Genitourinary: Negative.   Musculoskeletal: Negative.   Skin: Negative.   Neurological: Negative.   Hematological: Negative.   Psychiatric/Behavioral: Negative.   All other systems reviewed and are negative.    Allergies  Penicillins  Home Medications   Current Outpatient Rx  Name Route Sig Dispense Refill  . ASPIRIN EC 81 MG PO TBEC  Take 162mg  daily--this will be two 81mg  tablets daily    . CLINDAMYCIN HCL 300 MG PO CAPS Oral Take 1 capsule (300 mg total) by mouth 4 (four) times daily. 28 capsule 0  . HYDROCODONE-ACETAMINOPHEN 5-500 MG PO TABS Oral Take 1 tablet by mouth every 6 (six) hours as needed for pain. 15 tablet 0  . METRONIDAZOLE 500 MG PO TABS Oral Take 1 tablet (500 mg total) by mouth 3 (three) times  daily. 21 tablet 0    BP 164/97  Pulse 77  Temp 98 F (36.7 C) (Oral)  Resp 18  Ht 5\' 9"  (1.753 m)  Wt 257 lb (116.574 kg)  BMI 37.95 kg/m2  SpO2 98%  Physical Exam  Nursing note and vitals reviewed. GEN: Well-developed, well-nourished male in no distress HEENT: Atraumatic. Patient with swelling over the right cheek with partial lamination of the right nasolabial fold. This is not indurated or fluctuant. Oropharynx is remarkable for several dental caries of the teeth of the right upper quadrant of the mouth. There is no buccal space or sublingual  swelling noted. EYES: PERRLA BL, no scleral icterus. No visual deficits, no pain with extraocular movements. Extraocular movements intact. Patient with minimal swelling of the right lower eyelid. NECK: Trachea midline, no meningismus Neuro: cranial nerves grossly 2-12 intact, no abnormalities of strength or sensation, A and O x 3 MSK: Patient moves all 4 extremities symmetrically, no deformity, edema, or injury noted Skin: No rashes petechiae, purpura, or jaundice Psych: no abnormality of mood   ED Course  Procedures (including critical care time)  Labs Reviewed  CBC WITH DIFFERENTIAL - Abnormal; Notable for the following:    WBC 10.7 (*)     All other components within normal limits  BASIC METABOLIC PANEL - Abnormal; Notable for the following:    Potassium 3.4 (*)     Glucose, Bld 101 (*)     All other components within normal limits   Ct Maxillofacial W/cm  04/09/2012  *RADIOLOGY REPORT*  Clinical Data: Right facial swelling and pain  CT MAXILLOFACIAL WITH CONTRAST  Technique:  Multidetector CT imaging of the maxillofacial structures was performed with intravenous contrast. Multiplanar CT image reconstructions were also generated.  Contrast: 75mL OMNIPAQUE IOHEXOL 300 MG/ML  SOLN  Comparison: None.  Findings: Extensive dental infection is present.  Numerous caries are present involving the left upper lower molars and the right upper molars.  Periapical lucency and infection is noted around the right upper molars and right upper second premolar.  There is cortical breakthrough of the lateral maxilla involving the  right upper second premolar.  There is adjacent soft tissue swelling in this area compatible with cellulitis.  No well-defined soft tissue abscess is seen although there is focal swelling in this area which could be early stages of an abscess.  Chronic sinusitis with mucosal edema in the maxillary sinuses bilaterally.  The orbit appears normal. 12 mm submandibular node on the right  is likely related to infection.  IMPRESSION: Extensive dental infection.  Periapical lucency with lateral maxillary cortical erosion and adjacent soft tissue swelling right upper second premolar.  Cellulitis is present without a well- defined abscess at this time.  Original Report Authenticated By: Camelia Phenes, M.D.     1. Dental caries   2. Facial cellulitis       MDM  Patient was evaluated by myself. Based upon evaluation I did perform a CBC, BMP, and CT of the face as patient had already taken 3 doses of antibiotics without significant improvement of the swelling. This did confirm that the patient had only a cellulitis with dental source and no evidence of abscess. Patient had taken 3 doses of clindamycin. This had been prescribed as patient is allergic to penicillin. Consult of pharmacy regarding the patient's antibiotic choice as he was continuing to improve. They recommended metronidazole. Patient was given a dose of 500 mg by mouth here. While being cared for he received a  liter of normal saline IV bolus as well as morphine 4 mg IV. I spoke with Dr. Lacretia Leigh office who confirms that the patient would not have to pay anything to be seen. While in the emergency department he called to set up his followup appointment for 10:45 tomorrow. With a combination of antibiotic change as well as schedule dental intervention patient's symptoms should resolve. He did not have any symptoms of orbital cellulitis or periorbital cellulitis on my evaluation. There is no abscess that would require acute drainage today. Patient was discharged in good condition.        Cyndra Numbers, MD 04/09/12 1540

## 2012-04-09 NOTE — ED Notes (Signed)
Patient states he was seen at Urgent Care yesterday for this issue; more pain and swelling noted today per patient.

## 2012-04-09 NOTE — ED Notes (Signed)
Pt sts he had tooth pain 2 days ago, pain resolved but swelling started to right side of face yesterday. Pt sts he was seen at Urgent Care last night and given prescription for pain, antibiotic and ibuprofen. Pt taking meds but swelling worse this morning.

## 2013-10-05 ENCOUNTER — Encounter: Payer: Self-pay | Admitting: Cardiology

## 2014-04-23 ENCOUNTER — Ambulatory Visit (INDEPENDENT_AMBULATORY_CARE_PROVIDER_SITE_OTHER): Payer: BC Managed Care – PPO | Admitting: Nurse Practitioner

## 2014-04-23 ENCOUNTER — Encounter: Payer: Self-pay | Admitting: Nurse Practitioner

## 2014-04-23 VITALS — BP 120/90 | HR 62 | Ht 69.0 in | Wt 245.8 lb

## 2014-04-23 DIAGNOSIS — E785 Hyperlipidemia, unspecified: Secondary | ICD-10-CM

## 2014-04-23 DIAGNOSIS — I259 Chronic ischemic heart disease, unspecified: Secondary | ICD-10-CM

## 2014-04-23 DIAGNOSIS — F17200 Nicotine dependence, unspecified, uncomplicated: Secondary | ICD-10-CM

## 2014-04-23 DIAGNOSIS — Z720 Tobacco use: Secondary | ICD-10-CM

## 2014-04-23 LAB — CBC
HCT: 49.7 % (ref 39.0–52.0)
Hemoglobin: 16.7 g/dL (ref 13.0–17.0)
MCHC: 33.6 g/dL (ref 30.0–36.0)
MCV: 89.7 fl (ref 78.0–100.0)
Platelets: 329 10*3/uL (ref 150.0–400.0)
RBC: 5.54 Mil/uL (ref 4.22–5.81)
RDW: 13.5 % (ref 11.5–15.5)
WBC: 9.3 10*3/uL (ref 4.0–10.5)

## 2014-04-23 LAB — LIPID PANEL
Cholesterol: 309 mg/dL — ABNORMAL HIGH (ref 0–200)
HDL: 33.4 mg/dL — ABNORMAL LOW (ref >39.00)
LDL Cholesterol: 184 mg/dL — ABNORMAL HIGH (ref 0–99)
NonHDL: 275.6
Total CHOL/HDL Ratio: 9
Triglycerides: 457 mg/dL — ABNORMAL HIGH (ref 0.0–149.0)
VLDL: 91.4 mg/dL — ABNORMAL HIGH (ref 0.0–40.0)

## 2014-04-23 LAB — BASIC METABOLIC PANEL
BUN: 11 mg/dL (ref 6–23)
CO2: 26 mEq/L (ref 19–32)
Calcium: 9.7 mg/dL (ref 8.4–10.5)
Chloride: 104 mEq/L (ref 96–112)
Creatinine, Ser: 1 mg/dL (ref 0.4–1.5)
GFR: 92.11 mL/min (ref >60.00)
Glucose, Bld: 93 mg/dL (ref 70–99)
Potassium: 4.3 mEq/L (ref 3.5–5.1)
Sodium: 139 mEq/L (ref 135–145)

## 2014-04-23 LAB — HEPATIC FUNCTION PANEL
ALT: 34 U/L (ref 0–53)
AST: 28 U/L (ref 0–37)
Albumin: 4.1 g/dL (ref 3.5–5.2)
Alkaline Phosphatase: 64 U/L (ref 39–117)
Bilirubin, Direct: 0.1 mg/dL (ref 0.0–0.3)
Total Bilirubin: 0.4 mg/dL (ref 0.2–1.2)
Total Protein: 7.9 g/dL (ref 6.0–8.3)

## 2014-04-23 MED ORDER — NITROGLYCERIN 0.4 MG SL SUBL: 0.4000 mg | SUBLINGUAL_TABLET | SUBLINGUAL | Status: DC | PRN

## 2014-04-23 NOTE — Patient Instructions (Signed)
We need to check lab today  We will arrange for a treadmill test - 1st available please  Monitor your blood pressure at home  Call the [facility] office at [phone] if you have any questions, problems or concerns.

## 2014-04-23 NOTE — Progress Notes (Signed)
[name] Date of Birth: Jul 07, 1981 Medical Record #[id number]  History of Present Illness: [name] is seen back today for a follow up visit. Seen for [name]. Needs DOT clearance. He has known CAD with  Inferior MI (6/11). LHC with EF 55% and basal to mid inferior hypokinesis. Totally occluded RCA with weak left to right collaterals. 50% ostial OM1. Long 70% proximal LAD. Patient had PROMUS DES to LAD and RCA. Echo (9/11): EF 55%, no regional wall motion abnormalities. Other issues include traumatic subdural hematoma (8/11) with secondary tonic clonic seizure (9/11), GI bleed secondary to Mallory-Weiss tear with endoscopic clipping (9/11) and GI bleed secondary to esophagitis (2/12).   Last seen by [name] in 2012. GXT by Tereso Newcomer PA in 2012. Has not been here since.  Comes in today. Here with his wife. He is doing ok. Not on statin therapy. No recent labs. Back smoking. No chest pain. Active with his job but no regular exercise. BP ok at home when he checks it. He is fasting today. Not on any cholesterol medicine - says he does not have a cholesterol problem.   Current Outpatient Prescriptions  Medication Sig Dispense Refill  . aspirin EC 81 MG tablet Take 162mg  daily--this will be two 81mg  tablets daily       No current facility-administered medications for this visit.    Allergies  Allergen Reactions  . Penicillins Anaphylaxis and Rash    Past Medical History  Diagnosis Date  . CAD (coronary artery disease)     inferiot MI; LHC with EF 55% and basal to mid inferior hypokinesis. totally occluded RCA w/weak L-R collaterals. 50% ostial OM1. long 70% proximal LAD. Pt had promus DES to LAD and RCA. echo 9/11: EF 55%, no regional wall motion abnormalities   . 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 GI bleed 9/11    secondary to mallory-weiss tear with endoscopic clipping   . Transfusion history 2011/2012    Past Surgical  History  Procedure Laterality Date  . Cardiac surgery    . Patients states had two stent surgery to his heart  03/30/2010    History  Smoking status  . Former Smoker -- 1.50 packs/day  Smokeless tobacco  . Not on file    Comment: quit 9/11    History  Alcohol Use  . Yes    Comment: occasional     Family History  Problem Relation Age of Onset  . Coronary artery disease      noncontributory for early CAD and MI  . Rheum arthritis Father   . Cancer Mother   . Rheum arthritis Mother   . Diabetes Mother   . Hyperlipidemia Mother   . Hypertension Mother   . Stroke Other     Review of Systems: The review of systems is per the HPI.  All other systems were reviewed and are negative.  Physical Exam: BP 120/90  Pulse 62  Ht 5\' 9"  (1.753 m)  Wt 245 lb 12.8 oz (111.494 kg)  BMI 36.28 kg/m2 BP was 130/100 by me.  Patient is alert and in no acute distress. Does not seem really interested in being here. He is obese. Skin is warm and dry. Color is normal.  HEENT is unremarkable. Normocephalic/atraumatic. PERRL. Sclera are nonicteric. Neck is supple. No masses. No JVD. Lungs are clear. Cardiac exam shows a regular rate and rhythm. Abdomen is soft. Extremities are without edema. Gait  and ROM are intact. No gross neurologic deficits noted.  Wt Readings from Last 3 Encounters:  04/23/14 245 lb 12.8 oz (111.494 kg)  04/09/12 257 lb (116.574 kg)  05/01/11 249 lb (112.946 kg)    LABORATORY DATA/PROCEDURES: EKG with sinus, old inferior MI.   Lab Results  Component Value Date   WBC 10.7* 04/09/2012   HGB 14.6 04/09/2012   HCT 42.1 04/09/2012   PLT 280 04/09/2012   GLUCOSE 101* 04/09/2012   CHOL 145 05/22/2010   TRIG 151.0* 05/22/2010   HDL 40.90 05/22/2010   LDLCALC 74 05/22/2010   ALT 21 11/30/2010   AST 37 11/30/2010   NA 140 04/09/2012   K 3.4* 04/09/2012   CL 103 04/09/2012   CREATININE 0.90 04/09/2012   BUN 8 04/09/2012   CO2 27 04/09/2012   INR 0.89 11/30/2010    BNP (last 3  results) No results found for this basename: PROBNP,  in the last 8760 hours   Assessment / Plan: 1. CAD - prior inferior MI with PCI - no recurrent symptoms. Back smoking. Not on lipid lowering medicine. Poor insight in general. Does not seem motivated to make his health a priority at this time of his life. Will arrange for GXT.   2. Elevated BP - needs to monitor  3. HLD - checking labs today. Would most likely need statin therapy.   4. Tobacco abuse - back smoking - not interested in stopping at this time.  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 Needs to see Dr. Shirlee LatchMcLean in one year.   Patient is agreeable to this plan and will call if any problems develop in the interim.   Rosalio MacadamiaLori C. Cherica Heiden, RN, ANP-C [facility] [address] [place], [place]  [id number] [phone]

## 2014-04-26 ENCOUNTER — Encounter: Payer: Self-pay | Admitting: *Deleted

## 2014-04-27 ENCOUNTER — Telehealth (HOSPITAL_COMMUNITY): Payer: Self-pay

## 2014-04-27 NOTE — Telephone Encounter (Signed)
Encounter complete. 

## 2014-04-28 ENCOUNTER — Telehealth (HOSPITAL_COMMUNITY): Payer: Self-pay

## 2014-04-28 NOTE — Telephone Encounter (Signed)
Encounter complete. 

## 2014-04-29 ENCOUNTER — Telehealth (HOSPITAL_COMMUNITY): Payer: Self-pay

## 2014-04-29 ENCOUNTER — Ambulatory Visit (HOSPITAL_COMMUNITY)
Admission: RE | Admit: 2014-04-29 | Discharge: 2014-04-29 | Disposition: A | Discharge status: Home / Self Care | Payer: BC Managed Care – PPO | Source: Ambulatory Visit | Attending: Cardiology | Admitting: Cardiology

## 2014-04-29 DIAGNOSIS — F17200 Nicotine dependence, unspecified, uncomplicated: Secondary | ICD-10-CM | POA: Insufficient documentation

## 2014-04-29 DIAGNOSIS — Z720 Tobacco use: Secondary | ICD-10-CM

## 2014-04-29 DIAGNOSIS — I259 Chronic ischemic heart disease, unspecified: Secondary | ICD-10-CM

## 2014-04-29 DIAGNOSIS — E785 Hyperlipidemia, unspecified: Secondary | ICD-10-CM | POA: Insufficient documentation

## 2014-04-29 NOTE — Telephone Encounter (Signed)
Encounter complete. 

## 2014-04-29 NOTE — Procedures (Signed)
Exercise Treadmill Test  Test  Exercise Tolerance Test Ordering MD: Norma FredricksonLori Gerhardt, NP    Unique Test No: 1  Treadmill:  1  Indication for ETT: stent patency, DOT physical  Contraindication to ETT: No   Stress Modality: exercise - treadmill  Cardiac Imaging Performed: non   Protocol: standard Bruce - maximal  Max BP:  190/60  Max MPHR (bpm):  87 85% MPR (bpm): 159  MPHR obtained (bpm):  166 % MPHR obtained:  88  Reached 85% MPHR (min:sec):  10:15 Total Exercise Time (min-sec):  11  Workload in METS: 13.4 Borg Scale: 15  Reason ETT Terminated:  dyspnea    ST Segment Analysis At Rest: NSR, no ST changes. With Exercise: no evidence of significant ST depression  Other Information Arrhythmia:  No Angina during ETT:  absent (0) Quality of ETT:  diagnostic  ETT Interpretation:  normal - no evidence of ischemia by ST analysis  Comments: ETT with good exercise tolerance; no chest pain; normal BP response; no ST changes; negative adequate ETT.  Olga MillersBrian Trask Vosler

## 2014-04-30 ENCOUNTER — Other Ambulatory Visit: Payer: Self-pay | Admitting: *Deleted

## 2014-04-30 DIAGNOSIS — E785 Hyperlipidemia, unspecified: Secondary | ICD-10-CM

## 2014-04-30 MED ORDER — ATORVASTATIN CALCIUM 20 MG PO TABS: 20.0000 mg | ORAL_TABLET | Freq: Every day | ORAL | Status: DC

## 2014-05-10 ENCOUNTER — Telehealth: Payer: Self-pay | Admitting: *Deleted

## 2014-05-10 NOTE — Telephone Encounter (Signed)
Walk in patient requesting copies of his July 10th lab work results and July 16th procedure results. Routed request to Medical Records/Dena. She will assist patient with obtaining "release" and provide the results to patient. The patient has already been notified of all results previously too.

## 2014-06-11 ENCOUNTER — Ambulatory Visit: Payer: BC Managed Care – PPO

## 2014-06-11 ENCOUNTER — Other Ambulatory Visit (INDEPENDENT_AMBULATORY_CARE_PROVIDER_SITE_OTHER): Payer: BC Managed Care – PPO

## 2014-06-11 DIAGNOSIS — E785 Hyperlipidemia, unspecified: Secondary | ICD-10-CM

## 2014-06-11 DIAGNOSIS — I259 Chronic ischemic heart disease, unspecified: Secondary | ICD-10-CM

## 2014-06-11 DIAGNOSIS — F17200 Nicotine dependence, unspecified, uncomplicated: Secondary | ICD-10-CM

## 2014-06-11 LAB — LIPID PANEL
Cholesterol: 173 mg/dL (ref 0–200)
HDL: 34.1 mg/dL — ABNORMAL LOW (ref >39.00)
NonHDL: 138.9
Total CHOL/HDL Ratio: 5
Triglycerides: 243 mg/dL — ABNORMAL HIGH (ref 0.0–149.0)
VLDL: 48.6 mg/dL — ABNORMAL HIGH (ref 0.0–40.0)

## 2014-06-11 LAB — LDL CHOLESTEROL, DIRECT: Direct LDL: 102.9 mg/dL

## 2014-06-14 LAB — HEPATIC FUNCTION PANEL
ALT: 46 U/L (ref 0–53)
AST: 34 U/L (ref 0–37)
Albumin: 3.9 g/dL (ref 3.5–5.2)
Alkaline Phosphatase: 68 U/L (ref 39–117)
Bilirubin, Direct: 0 mg/dL (ref 0.0–0.3)
Total Bilirubin: 0.5 mg/dL (ref 0.2–1.2)
Total Protein: 7.4 g/dL (ref 6.0–8.3)

## 2014-06-15 ENCOUNTER — Encounter: Payer: Self-pay | Admitting: *Deleted

## 2014-06-15 ENCOUNTER — Telehealth: Payer: Self-pay | Admitting: *Deleted

## 2014-06-15 NOTE — Telephone Encounter (Signed)
lmptcb x 3 for lab results; I will send out results letter today for pt. labs look good.

## 2015-01-14 ENCOUNTER — Other Ambulatory Visit: Payer: Self-pay | Admitting: Nurse Practitioner

## 2015-01-18 ENCOUNTER — Other Ambulatory Visit: Payer: Self-pay | Admitting: Nurse Practitioner

## 2017-06-03 ENCOUNTER — Telehealth: Payer: Self-pay | Admitting: Cardiology

## 2017-06-03 NOTE — Telephone Encounter (Signed)
Received records from [facility] for appointment on 06/24/17 with Dr Jens Som.  Records put with Dr Ludwig Clarks schedule for 06/24/17. lp

## 2017-06-12 ENCOUNTER — Encounter: Payer: Self-pay | Admitting: *Deleted

## 2017-06-14 ENCOUNTER — Other Ambulatory Visit: Payer: Self-pay | Admitting: Family Medicine

## 2017-06-14 DIAGNOSIS — I259 Chronic ischemic heart disease, unspecified: Secondary | ICD-10-CM

## 2017-06-18 ENCOUNTER — Telehealth (HOSPITAL_COMMUNITY): Payer: Self-pay

## 2017-06-18 NOTE — Progress Notes (Signed)
Requesting: Colin BrunswickMarsha White, NP Reason: CAD  HPI: 36 year old male for evaluation of coronary artery disease at request of Colin BrunswickMarsha White NP. Previously followed by Dr Shirlee LatchMclean; not seen since 04/23/14. 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 Cath revealed occluded RCA, 50 OM1, 70 proximal LAD. Pt had PCI of RCA and LAD with DES. Echo 9/11 showed normal LV function. ETT 7/12 negative. ETT repeated 06/19/17 normal. Also with h/o traumatic SDH. Patient denies dyspnea, chest pain, palpitations or syncope.  Current Outpatient Prescriptions  Medication Sig Dispense Refill  . aspirin EC 81 MG tablet Take 162mg  daily--this will be two 81mg  tablets daily    . atorvastatin (LIPITOR) 20 MG tablet TAKE 1 TABLET (20 MG TOTAL) BY MOUTH DAILY. 30 tablet 6  . nitroGLYCERIN (NITROSTAT) 0.4 MG SL tablet Place 1 tablet (0.4 mg total) under the tongue every 5 (five) minutes as needed for chest pain. 25 tablet 3   No current facility-administered medications for this visit.      Past Medical History:  Diagnosis Date  . CAD (coronary artery disease)    inferiot MI; LHC with EF 55% and basal to mid inferior hypokinesis. totally occluded RCA w/weak L-R collaterals. 50% ostial OM1. long 70% proximal LAD. Pt had promus DES to LAD and RCA. echo 9/11: EF 55%, no regional wall motion abnormalities   . GI bleed 9/11   secondary to mallory-weiss tear with endoscopic clipping   . Hyperlipidemia   . Hypertension   . Nephrolithiasis   . Transfusion history 2011/2012  . 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 CARDIAC SURGERY    . patients states had two stent surgery to his heart  03/30/2010    Social History   Social History  . Marital status: Married    Spouse name: N/A  . Number of children: 2  . Years of education: N/A   Occupational History  . Not on file.   Social History Main Topics  . Smoking status: Current Every Day  Smoker    Packs/day: 1.50  . Smokeless tobacco: Never Used     Comment: quit 9/11  . Alcohol use Yes     Comment: occasional   . Drug use: No  . Sexual activity: Not on file   Other Topics Concern  . Not on file   Social History Narrative   Mar[place]; [place].   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 Rheum arthritis Father   . Coronary artery disease Unknown        noncontributory for early CAD and MI  . Cancer Mother   . Rheum arthritis Mother   . Diabetes Mother   . Hyperlipidemia Mother   . Hypertension Mother   . Stroke Other     ROS: no fevers or chills, productive cough, hemoptysis, dysphasia, odynophagia, melena, hematochezia, dysuria, hematuria, rash, seizure activity, orthopnea, PND, pedal edema, claudication. Remaining systems are negative.  Physical Exam: Well-developed well-nourished in no acute distress.  Skin is warm and dry.  HEENT is normal.  Neck is supple.  Chest is clear to auscultation with normal expansion.  Cardiovascular exam is regular rate and rhythm.  Abdominal exam nontender or distended. No masses palpated. Extremities show no edema. neuro grossly intact  ECG- Normal sinus rhythm, no ST changes. personally reviewed  A/P  1 coronary artery disease-patient  is doing well from a symptomatic standpoint. Continue aspirin and statin. Recent treadmill negative. 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  2 hyperlipidemia-given documented coronary artery disease I will increase Lipitor to 80 mg daily. Check lipids and liver in 4 weeks.  3 hypertension-blood pressure is controlled. Continue present medications.  4 tobacco abuse-patient counseled on discontinuing.   Colin Millers, MD

## 2017-06-18 NOTE — Telephone Encounter (Signed)
Encounter complete. 

## 2017-06-19 ENCOUNTER — Ambulatory Visit (HOSPITAL_COMMUNITY)
Admission: RE | Admit: 2017-06-19 | Discharge: 2017-06-19 | Disposition: A | Discharge status: Home / Self Care | Payer: 59 | Source: Ambulatory Visit | Attending: Cardiology | Admitting: Cardiology

## 2017-06-19 DIAGNOSIS — I259 Chronic ischemic heart disease, unspecified: Secondary | ICD-10-CM | POA: Diagnosis present

## 2017-06-19 LAB — EXERCISE TOLERANCE TEST
CHL CUP MPHR: 184 {beats}/min
CHL CUP RESTING HR STRESS: 68 {beats}/min
CSEPHR: 86 %
CSEPPHR: 160 {beats}/min
Estimated workload: 13.7 METS
Exercise duration (min): 12 min
Exercise duration (sec): 1 s
RPE: 18

## 2017-06-24 ENCOUNTER — Encounter: Payer: Self-pay | Admitting: Cardiology

## 2017-06-24 ENCOUNTER — Ambulatory Visit (INDEPENDENT_AMBULATORY_CARE_PROVIDER_SITE_OTHER): Payer: 59 | Admitting: Cardiology

## 2017-06-24 VITALS — BP 132/86 | HR 78 | Ht 69.0 in | Wt 195.0 lb

## 2017-06-24 DIAGNOSIS — I2510 Atherosclerotic heart disease of native coronary artery without angina pectoris: Secondary | ICD-10-CM | POA: Diagnosis not present

## 2017-06-24 DIAGNOSIS — I10 Essential (primary) hypertension: Secondary | ICD-10-CM

## 2017-06-24 DIAGNOSIS — E7800 Pure hypercholesterolemia, unspecified: Secondary | ICD-10-CM

## 2017-06-24 MED ORDER — ATORVASTATIN CALCIUM 80 MG PO TABS: 80.0000 mg | ORAL_TABLET | Freq: Every day | ORAL | 3 refills | Status: DC

## 2017-06-24 NOTE — Patient Instructions (Signed)
Medication Instructions:   INCREASE ATORVASTATIN TO 80 MG ONCE DAILY= 4 OF THE 20 MG TABLETS ONCE DAILY  Labwork:  Your physician recommends that you return for lab work in: 4 WEEKS= DO NOT EAT PRIOR TO LAB WORK  Follow-Up:  Your physician wants you to follow-up in: ONE YEAR WITH DR Jens SomRENSHAW [place] You will receive a reminder letter in the mail two months in advance. If you don't receive a letter, please call our office to schedule the follow-up appointment.   If you need a refill on your cardiac medications before your next appointment, please call your pharmacy.

## 2017-06-28 ENCOUNTER — Telehealth: Payer: Self-pay | Admitting: Cardiology

## 2017-06-28 NOTE — Telephone Encounter (Signed)
Doubt one dose would cause this; when he feels better, would resume lipitor at 80 mg daily and follow for symptoms Olga Millers

## 2017-06-28 NOTE — Telephone Encounter (Signed)
New message      Pt c/o medication issue:  1. Name of Medication:  atorvastatin 2. How are you currently taking this medication (dosage and times per day)?  3. Are you having a reaction (difficulty breathing--STAT)?  no  4. What is your medication issue?  Pt states he went from  to  and now he feels like "crap".  Could this be from the medication increase?

## 2017-06-28 NOTE — Telephone Encounter (Signed)
SPOKE WITH PATIENT. Patient states he started 80 mg atorvastatin ( 4 x 20 mg) on Monday.  He woke up Tuesday  and felt bad -- headache aching all over and no energy. He states he has felt like this all week - patient states he has only take that one dose of 80 mg Patient wanted to know if the increase does- cause this ? and could he gradually increase dose 20 to 40 to 80 mg?  Informed patient possibility of aches ,but not usually headaches and not every just taking medication one time.   Patient aware will defer to DR [facility] AND will contact him with answer?

## 2017-07-01 NOTE — Telephone Encounter (Signed)
Spoke to patient. Instruction given to continue with 80 mg atorvastatin and monitor symptoms call office. Patient verbalized understanding.

## 2017-08-14 ENCOUNTER — Encounter: Payer: Self-pay | Admitting: *Deleted

## 2018-07-23 ENCOUNTER — Emergency Department (HOSPITAL_COMMUNITY): Payer: 59

## 2018-07-23 ENCOUNTER — Emergency Department (HOSPITAL_COMMUNITY)
Admission: EM | Admit: 2018-07-23 | Discharge: 2018-07-24 | Disposition: A | Discharge status: Home / Self Care | Payer: 59 | Attending: Emergency Medicine | Admitting: Emergency Medicine

## 2018-07-23 ENCOUNTER — Encounter (HOSPITAL_COMMUNITY): Payer: Self-pay | Admitting: Emergency Medicine

## 2018-07-23 ENCOUNTER — Other Ambulatory Visit: Payer: Self-pay

## 2018-07-23 DIAGNOSIS — Z79899 Other long term (current) drug therapy: Secondary | ICD-10-CM | POA: Diagnosis not present

## 2018-07-23 DIAGNOSIS — S60512A Abrasion of left hand, initial encounter: Secondary | ICD-10-CM | POA: Diagnosis not present

## 2018-07-23 DIAGNOSIS — S39012A Strain of muscle, fascia and tendon of lower back, initial encounter: Secondary | ICD-10-CM | POA: Diagnosis not present

## 2018-07-23 DIAGNOSIS — Y999 Unspecified external cause status: Secondary | ICD-10-CM | POA: Insufficient documentation

## 2018-07-23 DIAGNOSIS — I10 Essential (primary) hypertension: Secondary | ICD-10-CM | POA: Insufficient documentation

## 2018-07-23 DIAGNOSIS — F17210 Nicotine dependence, cigarettes, uncomplicated: Secondary | ICD-10-CM | POA: Insufficient documentation

## 2018-07-23 DIAGNOSIS — S298XXA Other specified injuries of thorax, initial encounter: Secondary | ICD-10-CM

## 2018-07-23 DIAGNOSIS — Z7982 Long term (current) use of aspirin: Secondary | ICD-10-CM | POA: Diagnosis not present

## 2018-07-23 DIAGNOSIS — M549 Dorsalgia, unspecified: Secondary | ICD-10-CM

## 2018-07-23 DIAGNOSIS — S3992XA Unspecified injury of lower back, initial encounter: Secondary | ICD-10-CM | POA: Diagnosis present

## 2018-07-23 DIAGNOSIS — Y92410 Unspecified street and highway as the place of occurrence of the external cause: Secondary | ICD-10-CM | POA: Insufficient documentation

## 2018-07-23 DIAGNOSIS — I2510 Atherosclerotic heart disease of native coronary artery without angina pectoris: Secondary | ICD-10-CM | POA: Insufficient documentation

## 2018-07-23 DIAGNOSIS — Y9389 Activity, other specified: Secondary | ICD-10-CM | POA: Diagnosis not present

## 2018-07-23 DIAGNOSIS — S300XXA Contusion of lower back and pelvis, initial encounter: Secondary | ICD-10-CM | POA: Diagnosis not present

## 2018-07-23 MED ORDER — FENTANYL CITRATE (PF) 100 MCG/2ML IJ SOLN
50.0000 ug | Freq: Once | INTRAMUSCULAR | Status: AC
  Administered 2018-07-23: 50 ug via INTRAVENOUS
  Filled 2018-07-23: qty 2
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
## 2018-07-24 ENCOUNTER — Emergency Department (HOSPITAL_COMMUNITY): Payer: 59

## 2018-07-24 LAB — I-STAT CHEM 8, ED
BUN: 12 mg/dL (ref 6–20)
Calcium, Ion: 1.14 mmol/L — ABNORMAL LOW (ref 1.15–1.40)
Chloride: 105 mmol/L (ref 98–111)
Creatinine, Ser: 0.9 mg/dL (ref 0.61–1.24)
Glucose, Bld: 99 mg/dL (ref 70–99)
HEMATOCRIT: 48 % (ref 39.0–52.0)
HEMOGLOBIN: 16.3 g/dL (ref 13.0–17.0)
POTASSIUM: 3.4 mmol/L — AB (ref 3.5–5.1)
SODIUM: 140 mmol/L (ref 135–145)
TCO2: 24 mmol/L (ref 22–32)

## 2018-07-24 MED ORDER — HYDROCODONE-ACETAMINOPHEN 5-325 MG PO TABS: 1.0000 | ORAL_TABLET | Freq: Four times a day (QID) | ORAL | 0 refills | Status: DC | PRN

## 2018-07-24 MED ORDER — KETOROLAC TROMETHAMINE 30 MG/ML IJ SOLN
30.0000 mg | Freq: Once | INTRAMUSCULAR | Status: AC
  Administered 2018-07-24: 30 mg via INTRAVENOUS
  Filled 2018-07-24: qty 1

## 2018-07-24 MED ORDER — FENTANYL CITRATE (PF) 100 MCG/2ML IJ SOLN
50.0000 ug | Freq: Once | INTRAMUSCULAR | Status: AC
  Administered 2018-07-24: 50 ug via INTRAVENOUS
  Filled 2018-07-24: qty 2

## 2018-07-24 MED ORDER — IOPAMIDOL (ISOVUE-300) INJECTION 61%
100.0000 mL | Freq: Once | INTRAVENOUS | Status: AC | PRN
  Administered 2018-07-24: 100 mL via INTRAVENOUS
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
## 2020-06-23 ENCOUNTER — Other Ambulatory Visit: Payer: Self-pay

## 2020-06-23 ENCOUNTER — Other Ambulatory Visit: Payer: Self-pay | Admitting: *Deleted

## 2020-06-23 ENCOUNTER — Inpatient Hospital Stay (HOSPITAL_COMMUNITY): Payer: Self-pay

## 2020-06-23 ENCOUNTER — Inpatient Hospital Stay (HOSPITAL_COMMUNITY)
Admission: EM | Admit: 2020-06-23 | Discharge: 2020-06-29 | DRG: 233 | Disposition: A | Discharge status: Home / Self Care | Payer: Self-pay | Attending: Cardiothoracic Surgery | Admitting: Cardiothoracic Surgery

## 2020-06-23 ENCOUNTER — Encounter (HOSPITAL_COMMUNITY): Payer: Self-pay | Admitting: Emergency Medicine

## 2020-06-23 ENCOUNTER — Encounter (HOSPITAL_COMMUNITY)
Admission: EM | Disposition: A | Discharge status: Home / Self Care | Payer: Self-pay | Source: Home / Self Care | Attending: Cardiothoracic Surgery

## 2020-06-23 ENCOUNTER — Emergency Department (HOSPITAL_COMMUNITY): Payer: Self-pay

## 2020-06-23 DIAGNOSIS — J939 Pneumothorax, unspecified: Secondary | ICD-10-CM

## 2020-06-23 DIAGNOSIS — Z87820 Personal history of traumatic brain injury: Secondary | ICD-10-CM

## 2020-06-23 DIAGNOSIS — Z951 Presence of aortocoronary bypass graft: Secondary | ICD-10-CM

## 2020-06-23 DIAGNOSIS — I340 Nonrheumatic mitral (valve) insufficiency: Secondary | ICD-10-CM

## 2020-06-23 DIAGNOSIS — Z880 Allergy status to penicillin: Secondary | ICD-10-CM

## 2020-06-23 DIAGNOSIS — Z01810 Encounter for preprocedural cardiovascular examination: Secondary | ICD-10-CM

## 2020-06-23 DIAGNOSIS — Z8249 Family history of ischemic heart disease and other diseases of the circulatory system: Secondary | ICD-10-CM

## 2020-06-23 DIAGNOSIS — Z7982 Long term (current) use of aspirin: Secondary | ICD-10-CM

## 2020-06-23 DIAGNOSIS — J90 Pleural effusion, not elsewhere classified: Secondary | ICD-10-CM

## 2020-06-23 DIAGNOSIS — I255 Ischemic cardiomyopathy: Secondary | ICD-10-CM | POA: Diagnosis present

## 2020-06-23 DIAGNOSIS — I5021 Acute systolic (congestive) heart failure: Secondary | ICD-10-CM | POA: Diagnosis present

## 2020-06-23 DIAGNOSIS — R072 Precordial pain: Secondary | ICD-10-CM

## 2020-06-23 DIAGNOSIS — Z87442 Personal history of urinary calculi: Secondary | ICD-10-CM

## 2020-06-23 DIAGNOSIS — I214 Non-ST elevation (NSTEMI) myocardial infarction: Secondary | ICD-10-CM

## 2020-06-23 DIAGNOSIS — Z20822 Contact with and (suspected) exposure to covid-19: Secondary | ICD-10-CM | POA: Diagnosis present

## 2020-06-23 DIAGNOSIS — Z79899 Other long term (current) drug therapy: Secondary | ICD-10-CM

## 2020-06-23 DIAGNOSIS — Z8719 Personal history of other diseases of the digestive system: Secondary | ICD-10-CM

## 2020-06-23 DIAGNOSIS — I2510 Atherosclerotic heart disease of native coronary artery without angina pectoris: Secondary | ICD-10-CM

## 2020-06-23 DIAGNOSIS — I252 Old myocardial infarction: Secondary | ICD-10-CM

## 2020-06-23 DIAGNOSIS — Z83438 Family history of other disorder of lipoprotein metabolism and other lipidemia: Secondary | ICD-10-CM

## 2020-06-23 DIAGNOSIS — I25110 Atherosclerotic heart disease of native coronary artery with unstable angina pectoris: Secondary | ICD-10-CM

## 2020-06-23 DIAGNOSIS — Z9889 Other specified postprocedural states: Secondary | ICD-10-CM

## 2020-06-23 DIAGNOSIS — R079 Chest pain, unspecified: Secondary | ICD-10-CM

## 2020-06-23 DIAGNOSIS — D62 Acute posthemorrhagic anemia: Secondary | ICD-10-CM | POA: Diagnosis not present

## 2020-06-23 DIAGNOSIS — Z9119 Patient's noncompliance with other medical treatment and regimen: Secondary | ICD-10-CM

## 2020-06-23 DIAGNOSIS — I493 Ventricular premature depolarization: Secondary | ICD-10-CM | POA: Diagnosis present

## 2020-06-23 DIAGNOSIS — Z9861 Coronary angioplasty status: Secondary | ICD-10-CM

## 2020-06-23 DIAGNOSIS — E785 Hyperlipidemia, unspecified: Secondary | ICD-10-CM | POA: Diagnosis present

## 2020-06-23 DIAGNOSIS — I110 Hypertensive heart disease with heart failure: Secondary | ICD-10-CM | POA: Diagnosis present

## 2020-06-23 DIAGNOSIS — F17210 Nicotine dependence, cigarettes, uncomplicated: Secondary | ICD-10-CM | POA: Diagnosis present

## 2020-06-23 HISTORY — PX: LEFT HEART CATH AND CORONARY ANGIOGRAPHY: CATH118249

## 2020-06-23 HISTORY — PX: IABP INSERTION: CATH118242

## 2020-06-23 LAB — URINALYSIS, ROUTINE W REFLEX MICROSCOPIC
Bacteria, UA: NONE SEEN
Bilirubin Urine: NEGATIVE
Glucose, UA: NEGATIVE mg/dL
Ketones, ur: NEGATIVE mg/dL
Leukocytes,Ua: NEGATIVE
Nitrite: NEGATIVE
Protein, ur: NEGATIVE mg/dL
Specific Gravity, Urine: 1.006 (ref 1.005–1.030)
pH: 6 (ref 5.0–8.0)

## 2020-06-23 LAB — ECHOCARDIOGRAM COMPLETE
Area-P 1/2: 4.4 cm2
Height: 69 in
S' Lateral: 3.6 cm
Weight: 3680 oz

## 2020-06-23 LAB — SURGICAL PCR SCREEN
MRSA, PCR: NEGATIVE
Staphylococcus aureus: NEGATIVE

## 2020-06-23 LAB — COMPREHENSIVE METABOLIC PANEL
ALT: 47 U/L — ABNORMAL HIGH (ref 0–44)
AST: 179 U/L — ABNORMAL HIGH (ref 15–41)
Albumin: 3.4 g/dL — ABNORMAL LOW (ref 3.5–5.0)
Alkaline Phosphatase: 45 U/L (ref 38–126)
Anion gap: 11 (ref 5–15)
BUN: 7 mg/dL (ref 6–20)
CO2: 21 mmol/L — ABNORMAL LOW (ref 22–32)
Calcium: 8.4 mg/dL — ABNORMAL LOW (ref 8.9–10.3)
Chloride: 104 mmol/L (ref 98–111)
Creatinine, Ser: 0.84 mg/dL (ref 0.61–1.24)
GFR calc Af Amer: >60 mL/min (ref >60)
GFR calc non Af Amer: >60 mL/min (ref >60)
Glucose, Bld: 97 mg/dL (ref 70–99)
Potassium: 4 mmol/L (ref 3.5–5.1)
Sodium: 136 mmol/L (ref 135–145)
Total Bilirubin: 1 mg/dL (ref 0.3–1.2)
Total Protein: 6.1 g/dL — ABNORMAL LOW (ref 6.5–8.1)

## 2020-06-23 LAB — CBC
HCT: 49.6 % (ref 39.0–52.0)
Hemoglobin: 16.8 g/dL (ref 13.0–17.0)
MCH: 30.5 pg (ref 26.0–34.0)
MCHC: 33.9 g/dL (ref 30.0–36.0)
MCV: 90 fL (ref 80.0–100.0)
Platelets: 352 10*3/uL (ref 150–400)
RBC: 5.51 MIL/uL (ref 4.22–5.81)
RDW: 13.5 % (ref 11.5–15.5)
WBC: 11.4 10*3/uL — ABNORMAL HIGH (ref 4.0–10.5)
nRBC: 0 % (ref 0.0–0.2)

## 2020-06-23 LAB — BASIC METABOLIC PANEL
Anion gap: 13 (ref 5–15)
BUN: 11 mg/dL (ref 6–20)
CO2: 25 mmol/L (ref 22–32)
Calcium: 9.3 mg/dL (ref 8.9–10.3)
Chloride: 97 mmol/L — ABNORMAL LOW (ref 98–111)
Creatinine, Ser: 0.98 mg/dL (ref 0.61–1.24)
GFR calc Af Amer: >60 mL/min (ref >60)
GFR calc non Af Amer: >60 mL/min (ref >60)
Glucose, Bld: 134 mg/dL — ABNORMAL HIGH (ref 70–99)
Potassium: 3.4 mmol/L — ABNORMAL LOW (ref 3.5–5.1)
Sodium: 135 mmol/L (ref 135–145)

## 2020-06-23 LAB — TYPE AND SCREEN
ABO/RH(D): O POS
Antibody Screen: NEGATIVE

## 2020-06-23 LAB — HEPARIN LEVEL (UNFRACTIONATED): Heparin Unfractionated: 0.1 IU/mL — ABNORMAL LOW (ref 0.30–0.70)

## 2020-06-23 LAB — MAGNESIUM: Magnesium: 2 mg/dL (ref 1.7–2.4)

## 2020-06-23 LAB — TROPONIN I (HIGH SENSITIVITY)
Troponin I (High Sensitivity): 771 ng/L (ref <18)
Troponin I (High Sensitivity): 2292 ng/L (ref <18)
Troponin I (High Sensitivity): 21634 ng/L (ref <18)
Troponin I (High Sensitivity): >27000 ng/L (ref <18)

## 2020-06-23 LAB — SARS CORONAVIRUS 2 BY RT PCR (HOSPITAL ORDER, PERFORMED IN ~~LOC~~ HOSPITAL LAB): SARS Coronavirus 2: NEGATIVE

## 2020-06-23 SURGERY — IABP INSERTION
Anesthesia: LOCAL

## 2020-06-23 MED ORDER — METOPROLOL TARTRATE 12.5 MG HALF TABLET
12.5000 mg | ORAL_TABLET | Freq: Once | ORAL | Status: AC
  Administered 2020-06-24: 12.5 mg via ORAL
  Filled 2020-06-23: qty 1

## 2020-06-23 MED ORDER — TRANEXAMIC ACID (OHS) BOLUS VIA INFUSION
15.0000 mg/kg | INTRAVENOUS | Status: AC
  Administered 2020-06-24: 1564.5 mg via INTRAVENOUS
  Filled 2020-06-23: qty 1565

## 2020-06-23 MED ORDER — DEXMEDETOMIDINE HCL IN NACL 400 MCG/100ML IV SOLN
0.1000 ug/kg/h | INTRAVENOUS | Status: AC
  Administered 2020-06-24: .2 ug/kg/h via INTRAVENOUS
  Filled 2020-06-23: qty 100

## 2020-06-23 MED ORDER — HEPARIN (PORCINE) IN NACL 1000-0.9 UT/500ML-% IV SOLN
INTRAVENOUS | Status: AC
  Filled 2020-06-23: qty 1000

## 2020-06-23 MED ORDER — VERAPAMIL HCL 2.5 MG/ML IV SOLN
INTRAVENOUS | Status: DC | PRN
  Administered 2020-06-23: 10 mL via INTRA_ARTERIAL

## 2020-06-23 MED ORDER — TRANEXAMIC ACID (OHS) PUMP PRIME SOLUTION
2.0000 mg/kg | INTRAVENOUS | Status: DC
  Filled 2020-06-23: qty 2.09

## 2020-06-23 MED ORDER — INSULIN REGULAR(HUMAN) IN NACL 100-0.9 UT/100ML-% IV SOLN
INTRAVENOUS | Status: AC
  Administered 2020-06-24: 1 [IU]/h via INTRAVENOUS
  Filled 2020-06-23: qty 100

## 2020-06-23 MED ORDER — TEMAZEPAM 15 MG PO CAPS
15.0000 mg | ORAL_CAPSULE | Freq: Once | ORAL | Status: AC | PRN
  Administered 2020-06-23: 15 mg via ORAL
  Filled 2020-06-23: qty 1

## 2020-06-23 MED ORDER — AMIODARONE HCL IN DEXTROSE 360-4.14 MG/200ML-% IV SOLN
60.0000 mg/h | INTRAVENOUS | Status: AC
  Administered 2020-06-23 (×2): 60 mg/h via INTRAVENOUS

## 2020-06-23 MED ORDER — HEPARIN (PORCINE) 25000 UT/250ML-% IV SOLN
1150.0000 [IU]/h | INTRAVENOUS | Status: DC
  Administered 2020-06-23: 1000 [IU]/h via INTRAVENOUS
  Filled 2020-06-23: qty 250

## 2020-06-23 MED ORDER — HEPARIN SODIUM (PORCINE) 1000 UNIT/ML IJ SOLN
INTRAMUSCULAR | Status: AC
  Filled 2020-06-23: qty 1

## 2020-06-23 MED ORDER — LEVOFLOXACIN IN D5W 500 MG/100ML IV SOLN
500.0000 mg | INTRAVENOUS | Status: DC
  Filled 2020-06-23 (×2): qty 100

## 2020-06-23 MED ORDER — SODIUM CHLORIDE 0.9 % WEIGHT BASED INFUSION: 1.0000 mL/kg/h | INTRAVENOUS | Status: DC

## 2020-06-23 MED ORDER — IOHEXOL 350 MG/ML SOLN
INTRAVENOUS | Status: DC | PRN
  Administered 2020-06-23: 125 mL

## 2020-06-23 MED ORDER — AMIODARONE HCL IN DEXTROSE 360-4.14 MG/200ML-% IV SOLN
30.0000 mg/h | INTRAVENOUS | Status: DC
  Administered 2020-06-23 – 2020-06-27 (×7): 30 mg/h via INTRAVENOUS
  Filled 2020-06-23: qty 400
  Filled 2020-06-23 (×7): qty 200

## 2020-06-23 MED ORDER — PHENYLEPHRINE HCL-NACL 20-0.9 MG/250ML-% IV SOLN
30.0000 ug/min | INTRAVENOUS | Status: DC
  Filled 2020-06-23: qty 250

## 2020-06-23 MED ORDER — LIDOCAINE HCL (PF) 1 % IJ SOLN
INTRAMUSCULAR | Status: AC
  Filled 2020-06-23: qty 30

## 2020-06-23 MED ORDER — SODIUM CHLORIDE 0.9% FLUSH: 3.0000 mL | Freq: Two times a day (BID) | INTRAVENOUS | Status: DC

## 2020-06-23 MED ORDER — POTASSIUM CHLORIDE CRYS ER 20 MEQ PO TBCR
40.0000 meq | EXTENDED_RELEASE_TABLET | Freq: Once | ORAL | Status: AC
  Administered 2020-06-23: 40 meq via ORAL
  Filled 2020-06-23: qty 2

## 2020-06-23 MED ORDER — NITROGLYCERIN IN D5W 200-5 MCG/ML-% IV SOLN
2.0000 ug/min | INTRAVENOUS | Status: AC
  Administered 2020-06-24: 5 ug/min via INTRAVENOUS
  Filled 2020-06-23: qty 250

## 2020-06-23 MED ORDER — CHLORHEXIDINE GLUCONATE CLOTH 2 % EX PADS
6.0000 | MEDICATED_PAD | Freq: Every day | CUTANEOUS | Status: DC
  Administered 2020-06-25 – 2020-06-29 (×4): 6 via TOPICAL

## 2020-06-23 MED ORDER — ROSUVASTATIN CALCIUM 20 MG PO TABS
40.0000 mg | ORAL_TABLET | Freq: Every day | ORAL | Status: DC
  Administered 2020-06-23 – 2020-06-29 (×6): 40 mg via ORAL
  Filled 2020-06-23 (×6): qty 2

## 2020-06-23 MED ORDER — CARVEDILOL 3.125 MG PO TABS: 3.1250 mg | ORAL_TABLET | Freq: Two times a day (BID) | ORAL | Status: DC

## 2020-06-23 MED ORDER — ASPIRIN 81 MG PO CHEW
324.0000 mg | CHEWABLE_TABLET | Freq: Once | ORAL | Status: AC
  Administered 2020-06-23: 324 mg via ORAL
  Filled 2020-06-23: qty 4

## 2020-06-23 MED ORDER — FUROSEMIDE 10 MG/ML IJ SOLN
40.0000 mg | Freq: Two times a day (BID) | INTRAMUSCULAR | Status: DC
  Administered 2020-06-23 (×2): 40 mg via INTRAVENOUS
  Filled 2020-06-23 (×2): qty 4

## 2020-06-23 MED ORDER — EPINEPHRINE HCL 5 MG/250ML IV SOLN IN NS
0.0000 ug/min | INTRAVENOUS | Status: DC
  Filled 2020-06-23: qty 250

## 2020-06-23 MED ORDER — FENTANYL CITRATE (PF) 100 MCG/2ML IJ SOLN
INTRAMUSCULAR | Status: AC
  Filled 2020-06-23: qty 2

## 2020-06-23 MED ORDER — HEPARIN (PORCINE) IN NACL 1000-0.9 UT/500ML-% IV SOLN
INTRAVENOUS | Status: DC | PRN
  Administered 2020-06-23: 500 mL

## 2020-06-23 MED ORDER — SODIUM CHLORIDE 0.9 % IV SOLN: 250.0000 mL | INTRAVENOUS | Status: DC | PRN

## 2020-06-23 MED ORDER — AMIODARONE LOAD VIA INFUSION
150.0000 mg | Freq: Once | INTRAVENOUS | Status: AC
  Administered 2020-06-23: 150 mg via INTRAVENOUS
  Filled 2020-06-23: qty 83.34

## 2020-06-23 MED ORDER — POTASSIUM CHLORIDE 2 MEQ/ML IV SOLN
80.0000 meq | INTRAVENOUS | Status: DC
  Filled 2020-06-23 (×2): qty 40

## 2020-06-23 MED ORDER — HEPARIN (PORCINE) 25000 UT/250ML-% IV SOLN
1200.0000 [IU]/h | INTRAVENOUS | Status: DC
  Administered 2020-06-23: 1200 [IU]/h via INTRAVENOUS
  Filled 2020-06-23: qty 250

## 2020-06-23 MED ORDER — SODIUM CHLORIDE 0.9 % IV BOLUS
1000.0000 mL | Freq: Once | INTRAVENOUS | Status: AC
  Administered 2020-06-23: 1000 mL via INTRAVENOUS

## 2020-06-23 MED ORDER — CHLORHEXIDINE GLUCONATE 0.12 % MT SOLN
15.0000 mL | Freq: Once | OROMUCOSAL | Status: AC
  Administered 2020-06-24: 15 mL via OROMUCOSAL
  Filled 2020-06-23: qty 15

## 2020-06-23 MED ORDER — CHLORHEXIDINE GLUCONATE CLOTH 2 % EX PADS
6.0000 | MEDICATED_PAD | Freq: Once | CUTANEOUS | Status: AC
  Administered 2020-06-24: 6 via TOPICAL

## 2020-06-23 MED ORDER — SODIUM CHLORIDE 0.9% FLUSH
3.0000 mL | Freq: Two times a day (BID) | INTRAVENOUS | Status: DC
  Administered 2020-06-23: 3 mL via INTRAVENOUS

## 2020-06-23 MED ORDER — BISACODYL 5 MG PO TBEC
5.0000 mg | DELAYED_RELEASE_TABLET | Freq: Once | ORAL | Status: AC
  Administered 2020-06-23: 5 mg via ORAL
  Filled 2020-06-23: qty 1

## 2020-06-23 MED ORDER — LIDOCAINE HCL (PF) 1 % IJ SOLN
INTRAMUSCULAR | Status: DC | PRN
  Administered 2020-06-23: 20 mL
  Administered 2020-06-23: 2 mL

## 2020-06-23 MED ORDER — PLASMA-LYTE 148 IV SOLN
INTRAVENOUS | Status: AC
  Administered 2020-06-24: 500 mL
  Filled 2020-06-23: qty 2.5

## 2020-06-23 MED ORDER — SODIUM CHLORIDE 0.9% FLUSH: 3.0000 mL | INTRAVENOUS | Status: DC | PRN

## 2020-06-23 MED ORDER — MILRINONE LACTATE IN DEXTROSE 20-5 MG/100ML-% IV SOLN
0.3000 ug/kg/min | INTRAVENOUS | Status: DC
  Filled 2020-06-23: qty 100

## 2020-06-23 MED ORDER — HEPARIN SODIUM (PORCINE) 1000 UNIT/ML IJ SOLN
INTRAMUSCULAR | Status: DC | PRN
  Administered 2020-06-23: 5000 [IU] via INTRAVENOUS

## 2020-06-23 MED ORDER — NITROGLYCERIN 0.4 MG SL SUBL
0.4000 mg | SUBLINGUAL_TABLET | SUBLINGUAL | Status: DC | PRN
  Administered 2020-06-23: 0.4 mg via SUBLINGUAL
  Filled 2020-06-23: qty 1

## 2020-06-23 MED ORDER — VERAPAMIL HCL 2.5 MG/ML IV SOLN
INTRAVENOUS | Status: AC
  Filled 2020-06-23: qty 2

## 2020-06-23 MED ORDER — SODIUM CHLORIDE 0.9 % IV SOLN
INTRAVENOUS | Status: DC
  Filled 2020-06-23: qty 30

## 2020-06-23 MED ORDER — VANCOMYCIN HCL 1500 MG/300ML IV SOLN
1500.0000 mg | INTRAVENOUS | Status: AC
  Administered 2020-06-24: 1500 mg via INTRAVENOUS
  Filled 2020-06-23 (×2): qty 300

## 2020-06-23 MED ORDER — SODIUM CHLORIDE 0.9 % WEIGHT BASED INFUSION
3.0000 mL/kg/h | INTRAVENOUS | Status: DC
  Administered 2020-06-23: 3 mL/kg/h via INTRAVENOUS

## 2020-06-23 MED ORDER — NOREPINEPHRINE 4 MG/250ML-% IV SOLN
0.0000 ug/min | INTRAVENOUS | Status: DC
  Filled 2020-06-23: qty 250

## 2020-06-23 MED ORDER — HEPARIN BOLUS VIA INFUSION
4000.0000 [IU] | Freq: Once | INTRAVENOUS | Status: AC
  Administered 2020-06-23: 4000 [IU] via INTRAVENOUS

## 2020-06-23 MED ORDER — HYDRALAZINE HCL 20 MG/ML IJ SOLN: 10.0000 mg | INTRAMUSCULAR | Status: AC | PRN

## 2020-06-23 MED ORDER — FENTANYL CITRATE (PF) 100 MCG/2ML IJ SOLN
INTRAMUSCULAR | Status: DC | PRN
Start: 2020-06-23 — End: 2020-06-23
  Administered 2020-06-23: 50 ug via INTRAVENOUS
  Administered 2020-06-23: 25 ug via INTRAVENOUS

## 2020-06-23 MED ORDER — MIDAZOLAM HCL 2 MG/2ML IJ SOLN
INTRAMUSCULAR | Status: AC
  Filled 2020-06-23: qty 2

## 2020-06-23 MED ORDER — MIDAZOLAM HCL 2 MG/2ML IJ SOLN
INTRAMUSCULAR | Status: DC | PRN
  Administered 2020-06-23 (×2): 1 mg via INTRAVENOUS

## 2020-06-23 MED ORDER — MAGNESIUM SULFATE 50 % IJ SOLN
40.0000 meq | INTRAMUSCULAR | Status: DC
  Filled 2020-06-23 (×2): qty 9.85

## 2020-06-23 MED ORDER — NITROGLYCERIN IN D5W 200-5 MCG/ML-% IV SOLN: 0.0000 ug/min | INTRAVENOUS | Status: DC

## 2020-06-23 MED ORDER — CHLORHEXIDINE GLUCONATE CLOTH 2 % EX PADS
6.0000 | MEDICATED_PAD | Freq: Once | CUTANEOUS | Status: AC
  Administered 2020-06-23: 6 via TOPICAL

## 2020-06-23 MED ORDER — ASPIRIN EC 81 MG PO TBEC: 81.0000 mg | DELAYED_RELEASE_TABLET | Freq: Every day | ORAL | Status: DC

## 2020-06-23 MED ORDER — TRANEXAMIC ACID 1000 MG/10ML IV SOLN
1.5000 mg/kg/h | INTRAVENOUS | Status: AC
  Administered 2020-06-24: 1.5 mg/kg/h via INTRAVENOUS
  Filled 2020-06-23: qty 25

## 2020-06-23 MED ORDER — NITROGLYCERIN IN D5W 200-5 MCG/ML-% IV SOLN
0.0000 ug/min | INTRAVENOUS | Status: DC
  Filled 2020-06-23: qty 250

## 2020-06-23 MED ORDER — ONDANSETRON HCL 4 MG/2ML IJ SOLN: 4.0000 mg | Freq: Four times a day (QID) | INTRAMUSCULAR | Status: DC | PRN

## 2020-06-23 MED ORDER — ACETAMINOPHEN 325 MG PO TABS: 650.0000 mg | ORAL_TABLET | ORAL | Status: DC | PRN

## 2020-06-23 SURGICAL SUPPLY — 17 items
BALLN IABP SENSA PLUS 8F 50CC (BALLOONS) ×2
BALLOON IABP SENS PLUS 8F 50CC (BALLOONS) IMPLANT
CATH 5FR JL3.5 JR4 ANG PIG MP (CATHETERS) ×1 IMPLANT
DEVICE RAD COMP TR BAND LRG (VASCULAR PRODUCTS) ×1 IMPLANT
GLIDESHEATH SLEND SS 6F .021 (SHEATH) ×1 IMPLANT
GUIDEWIRE INQWIRE 1.5J.035X260 (WIRE) IMPLANT
INQWIRE 1.5J .035X260CM (WIRE) ×2
KIT ENCORE 26 ADVANTAGE (KITS) ×1 IMPLANT
KIT HEART LEFT (KITS) ×2 IMPLANT
KIT MICROPUNCTURE NIT STIFF (SHEATH) ×1 IMPLANT
PACK CARDIAC CATHETERIZATION (CUSTOM PROCEDURE TRAY) ×2 IMPLANT
SHEATH PINNACLE 6F 10CM (SHEATH) ×1 IMPLANT
SHEATH PROBE COVER 6X72 (BAG) ×1 IMPLANT
SYR MEDRAD MARK 7 150ML (SYRINGE) ×2 IMPLANT
TRANSDUCER W/STOPCOCK (MISCELLANEOUS) ×2 IMPLANT
TUBING CIL FLEX 10 FLL-RA (TUBING) ×2 IMPLANT
WIRE EMERALD 3MM-J .035X150CM (WIRE) ×1 IMPLANT

## 2020-06-23 NOTE — ED Notes (Signed)
CRITICAL VALUE ALERT  Critical Value:  Trop 2292  Date & Time Notied:  06/23/2020 6438  Provider Notified: Dr. Blinda Leatherwood  Orders Received/Actions taken: see chart

## 2020-06-23 NOTE — Progress Notes (Signed)
ANTICOAGULATION CONSULT NOTE  Pharmacy Consult for heparin Indication: IABP  Allergies  Allergen Reactions  . Penicillins Anaphylaxis, Swelling and Rash    "Rash from head to toe, throat closed up" with amoxicillin at 39 years old  PCN reaction causing immediate rash, facial/tongue/throat swelling, SOB or lightheadedness with hypotension: Yes Has patient had a PCN reaction causing severe rash involving mucus membranes or skin necrosis: Yes Has patient had a PCN reaction that required hospitalization: No Has patient had a PCN reaction occurring within the last 10 years: No If all of the above answers are "NO", then may proceed with Cephalosporin use.    Patient Measurements: Height: 5\' 9"  (175.3 cm) Weight: 104.3 kg (230 lb) IBW/kg (Calculated) : 70.7 Heparin Dosing Weight: 93.2 kg   Vital Signs: Temp: 97.9 F (36.6 C) (09/09 2031) Temp Source: Oral (09/09 2031) BP: 113/75 (09/09 1920) Pulse Rate: 93 (09/09 1345)  Labs: Recent Labs    06/23/20 0400 06/23/20 0552 06/23/20 1330 06/23/20 2047  HGB 16.8  --   --   --   HCT 49.6  --   --   --   PLT 352  --   --   --   HEPARINUNFRC  --   --   --  0.10*  CREATININE 0.98  --  0.84  --   TROPONINIHS 771* 2,292* 21,634*  --     Estimated Creatinine Clearance: 140.4 mL/min (by C-G formula based on SCr of 0.84 mg/dL).   Medical History: Past Medical History:  Diagnosis Date  . CAD (coronary artery disease)    inferiot MI; LHC with EF 55% and basal to mid inferior hypokinesis. totally occluded RCA w/weak L-R collaterals. 50% ostial OM1. long 70% proximal LAD. Pt had promus DES to LAD and RCA. echo 9/11: EF 55%, no regional wall motion abnormalities   . GI bleed 9/11   secondary to mallory-weiss tear with endoscopic clipping   . Hyperlipidemia   . Hypertension   . Nephrolithiasis   . Transfusion history 2011/2012  . 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 [START ON 06/24/2020] aspirin EC  81 mg Oral Daily  . [START ON 06/24/2020] chlorhexidine  15 mL Mouth/Throat Once  . [START ON 06/24/2020] Chlorhexidine Gluconate Cloth  6 each Topical Once  . Chlorhexidine Gluconate Cloth  6 each Topical Daily  . [START ON 06/24/2020] epinephrine  0-10 mcg/min Intravenous To OR  . furosemide  40 mg Intravenous BID  . [START ON 06/24/2020] heparin-papaverine-plasmalyte irrigation   Irrigation To OR  . [START ON 06/24/2020] insulin   Intravenous To OR  . [START ON 06/24/2020] magnesium sulfate  40 mEq Other To OR  . [START ON 06/24/2020] metoprolol tartrate  12.5 mg Oral Once  . [START ON 06/24/2020] phenylephrine  30-200 mcg/min Intravenous To OR  . [START ON 06/24/2020] potassium chloride  80 mEq Other To OR  . rosuvastatin  40 mg Oral Daily  . sodium chloride flush  3 mL Intravenous Q12H  . [START ON 06/24/2020] tranexamic acid  15 mg/kg Intravenous To OR  . [START ON 06/24/2020] tranexamic acid  2 mg/kg Intracatheter To OR    Assessment: 39 yom with a hx of CAD s/p PCI of RCA and LAD in 2011 and hx GIB found to have NSTEMI.   Found to have 3v CAD with severely reduced LVEF 25-30% with LVEDP ~30 mmHg now s/p IABP placement. Okay to  start heparin 2 hours after radial sheath removal (removal time documented on 06/23/20 0951). No s/sx of bleeding. Hgb 16.8, plt 352. Trop [phone].  PM update - Initial heparin level low at 0.1. No bleeding or issues with infusion per discussion with RN.  Goal of Therapy:  Heparin level 0.2-0.5 units/ml Monitor platelets by anticoagulation protocol: Yes   Plan:  No bolus. Increase heparin to 1150 units/hr Check 6hr heparin level Monitor daily heparin level and CBC, s/sx bleeding CABG planned 9/10   Leia Alf, PharmD, BCPS Please check AMION for all [facility] Pharmacy contact numbers Clinical Pharmacist 06/23/2020 9:28 PM

## 2020-06-23 NOTE — Progress Notes (Signed)
    Called by RN as patient is experiencing increased ectopy on telemetry. Vitals are stable. Patient appears comfortable on exam. K+ 4.0, Mag is pending. Discussed with attending and will add IV amiodarone. F/u on mag level. TCTS at the bedside.   SignedLaverda Page, NP-C 06/23/2020, 2:51 PM Pager: [phone]

## 2020-06-23 NOTE — H&P (Addendum)
Cardiology Admission History and Physical:   Patient ID: [name] MRN: [id number]; DOB: 08/02/1981   Admission date: 06/23/2020  Primary Care Provider: April Manson, NP [facility] Cardiologist: Olga Millers, MD  [facility] Electrophysiologist:  None   Chief Complaint:  Chest pain  Patient Profile:   [name] is a 39 y.o. male with PMH of CAD s/p PCI of the RCA and LAD in 2011, HTN, HL, GI bleed who presented to the ED with chest pain and found to have elevated troponin.   History of Present Illness:   [name] is a 39 yo male with PMH noted above. Followed by Dr. Jens Som as an outpatient. Had an inferior MI in 2011 which showed occluded RCA, 50% OM1, 70% pLAD. Underwent PCI/DES of RCA, and pLAD with DES. Echo at that time showed a normal EF. Follow up ETT in 06/2017 was normal. Also with hx of traumatic SDH 2/2 to tonic clonic seizure. He was last seen in the office on 2018 and reported doing well. His Liptor was increased to 80mg  at this visit.   He continues to smoke about 1ppd, and stopped his statin shortly after his last office visit 2/2 to myalgias. Presented to the ED with complaints of centralized chest pain/pressure with radiation into the left shoulder and arm. Started last evening when he was laying down for bed. Called his daughter who brought him to [facility]. Was given SL nitro which dulled the pain, but did not resolve it.   In the ED his labs showed stable electrolytes with exception of K+ 3.4, hsTn 771-->2292, WBC 11.7, Hgb 16.8. COVID negative. CXR negative.  EKG showed SR with TWI in inferior leads, and slight ST as well. He was started on IV nitro and heparin. Transferred to [facility] for further management.   At the time of assessment reports chest pain of 7/10.    Past Medical History:  Diagnosis Date  . CAD (coronary artery disease)    inferiot MI; LHC with EF 55% and basal to mid inferior hypokinesis. totally occluded RCA w/weak L-R collaterals.  50% ostial OM1. long 70% proximal LAD. Pt had promus DES to LAD and RCA. echo 9/11: EF 55%, no regional wall motion abnormalities   . GI bleed 9/11   secondary to mallory-weiss tear with endoscopic clipping   . Hyperlipidemia   . Hypertension   . Nephrolithiasis   . Transfusion history 2011/2012  . 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 CARDIAC SURGERY    . patients states had two stent surgery to his heart  03/30/2010     Medications Prior to Admission: Prior to Admission medications   Medication Sig Start Date End Date Taking? Authorizing Provider  aspirin EC 81 MG tablet Take 162mg  daily--this will be two 81mg  tablets daily 05/01/11   , MD  atorvastatin (LIPITOR) 80 MG tablet Take 1 tablet (80 mg total) by mouth daily at 6 PM. 06/24/17   Karinne Schmader, 05/03/11, MD  hydrochlorothiazide (MICROZIDE) 12.5 MG capsule Take 1 capsule (12.5 mg total) by mouth daily. 06/24/17   08/24/17, MD  HYDROcodone-acetaminophen (NORCO/VICODIN) 5-325 MG tablet Take 1 tablet by mouth every 6 (six) hours as needed for severe pain. 07/24/18   08/24/17, MD  LORazepam (ATIVAN) 0.5 MG tablet Take 0.5 mg by mouth 2 (two) times daily. 06/17/18   [provider]  nitroGLYCERIN (NITROSTAT) 0.4 MG SL tablet Place 1 tablet (0.4 mg total) under the tongue every 5 (five) minutes as needed for chest pain. 04/23/14   Rosalio Macadamia, NP     Allergies:    Allergies  Allergen Reactions  . Penicillins Anaphylaxis, Swelling and Rash    Has patient had a PCN reaction causing immediate rash, facial/tongue/throat swelling, SOB or lightheadedness with hypotension: Yes Has patient had a PCN reaction causing severe rash involving mucus membranes or skin necrosis: Yes Has patient had a PCN reaction that required hospitalization: No Has patient had a PCN reaction occurring within the last 10  years: No If all of the above answers are "NO", then may proceed with Cephalosporin use.     Social History:   Social History   Socioeconomic History  . Marital status: Married    Spouse name: Not on file  . Number of children: 2  . Years of education: Not on file  . Highest education level: Not on file  Occupational History  . Not on file  Tobacco Use  . Smoking status: Current Every Day Smoker    Packs/day: 1.50  . Smokeless tobacco: Never Used  . Tobacco comment: quit 9/11  Substance and Sexual Activity  . Alcohol use: Yes    Comment: occasional   . Drug use: No  . Sexual activity: Not on file  Other Topics Concern  . Not on file  Social History Narrative   Married; lives in [place].   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 Difficulty of Paying Living Expenses: Not on file  Food Insecurity:   . Worried About Programme researcher, broadcasting/film/video in the Last Year: Not on file  . Ran Out of Food in the Last Year: Not on file  Transportation Needs:   . Lack of Transportation (Medical): Not on file  . Lack of Transportation (Non-Medical): Not on file  Physical Activity:   . Days of Exercise per Week: Not on file  . Minutes of Exercise per Session: Not on file  Stress:   . Feeling of Stress : Not on file  Social Connections:   . Frequency of Communication with Friends and Family: Not on file  . Frequency of Social Gatherings with Friends and Family: Not on file  . Attends Religious Services: Not on file  . Active Member of Clubs or Organizations: Not on file  . Attends Banker Meetings: Not on file  . Marital Status: Not on file  Intimate Partner Violence:   . Fear of Current or Ex-Partner: Not on file  . Emotionally Abused: Not on file  . Physically Abused: Not on file  . Sexually Abused: Not on file    Family History:   The patient's family history includes Cancer in his mother; Coronary  artery disease in an other family member; Diabetes in his mother; Hyperlipidemia in his mother; Hypertension in his mother; Rheum arthritis in his father and mother; Stroke in an other family member.    ROS:  Please see the history of present illness.  All other ROS reviewed and negative.     Physical Exam/Data:   Vitals:   06/23/20 0600 06/23/20 0630 06/23/20 0732 06/23/20 0811  BP: (!) 93/54 119/87    Pulse: 62 (!) 59 (!) 58   Resp: 15 (!) 21 (!) 25   Temp:   98 F (36.7 C)   TempSrc:  Oral   SpO2: 96% 96% 99% 99%  Weight:   104.3 kg   Height:   5\' 9"  (1.753 m)    No intake or output data in the 24 hours ending 06/23/20 0816 Last 3 Weights 06/23/2020 06/23/2020 07/23/2018  Weight (lbs) 230 lb 230 lb 210 lb  Weight (kg) 104.327 kg 104.327 kg 95.255 kg     Body mass index is 33.97 kg/m.  General:  Well nourished, well developed, in no acute distress HEENT: normal Lymph: no adenopathy Neck: no JVD Endocrine:  No thryomegaly Vascular: No carotid bruits; FA pulses 2+ bilaterally without bruits  Cardiac:  normal S1, S2; RRR; no murmur  Lungs:  clear to auscultation bilaterally, no wheezing, rhonchi or rales  Abd: soft, nontender, no hepatomegaly  Ext: no edema Musculoskeletal:  No deformities, BUE and BLE strength normal and equal Skin: warm and dry  Neuro:  CNs 2-12 intact, no focal abnormalities noted Psych:  Normal affect    EKG:  The ECG that was done 9/9 was personally reviewed and demonstrates SR with new TWI in inferior leads, and slight ST depression in lead III and aVF.   Relevant CV Studies:  Echo: 06/2010   Left ventricle: The cavity size was normal. Systolic function was  normal. The estimated ejection fraction was 55%. Wall motion was  normal; there were no regional wall motion abnormalities.  Transthoracic echocardiography. M-mode, complete 2D, spectral  Doppler, and color Doppler. Height: Height: 175.3cm. Height: 69in.  Weight: Weight: 104.8kg. Weight:  230.5lb. Body mass index: BMI:  34.1kg/m^2. Body surface area: BSA: 2.422m^2. Blood pressure: 128/74.    Laboratory Data:  High Sensitivity Troponin:   Recent Labs  Lab 06/23/20 0400 06/23/20 0552  TROPONINIHS 771* 2,292*      Chemistry Recent Labs  Lab 06/23/20 0400  NA 135  K 3.4*  CL 97*  CO2 25  GLUCOSE 134*  BUN 11  CREATININE 0.98  CALCIUM 9.3  GFRNONAA >60  GFRAA >60  ANIONGAP 13    No results for input(s): PROT, ALBUMIN, AST, ALT, ALKPHOS, BILITOT in the last 168 hours. Hematology Recent Labs  Lab 06/23/20 0400  WBC 11.4*  RBC 5.51  HGB 16.8  HCT 49.6  MCV 90.0  MCH 30.5  MCHC 33.9  RDW 13.5  PLT 352   BNPNo results for input(s): BNP, PROBNP in the last 168 hours.  DDimer No results for input(s): DDIMER in the last 168 hours.   Radiology/Studies:  DG Chest Port 1 View  Result Date: 06/23/2020 CLINICAL DATA:  Chest pain radiating to the left arm and back. Shortness of breath and nausea. Current smoker. EXAM: PORTABLE CHEST 1 VIEW COMPARISON:  07/24/2018 FINDINGS: The heart size and mediastinal contours are within normal limits. Both lungs are clear. The visualized skeletal structures are unremarkable. IMPRESSION: No active disease. Electronically Signed   By: Burman NievesWilliam  Stevens M.D.   On: 06/23/2020 01:26       :[id number]} TIMI Risk Score for Unstable Angina or Non-ST Elevation MI:   The patient's TIMI risk score is 5, which indicates a 26% risk of all cause mortality, new or recurrent myocardial infarction or need for urgent revascularization in the next 14 days.    Assessment and Plan:   [name] is a 39 y.o. male with PMH of CAD s/p PCI of the RCA and LAD in 2011, HTN, HL, GI bleed who presented to the ED with chest pain and found to have elevated troponin.   1. NSTEMI:  presented with centralized chest pain with radiation into the left arm and shoulder. EKG with new TWI in inferior leads, hsTn 771-->>2292. Started on IV nitro and heparin.  Still with 7/10 at the time of assessment. Given elevated troponin will need coronary angiography.  -- The patient understands that risks included but are not limited to stroke (1 in 1000), death (1 in 1000), kidney failure [usually temporary] (1 in 500), bleeding (1 in 200), allergic reaction [possibly serious] (1 in 200).  -- continue on nitro, and heparin -- ASA, statin, hold on BB for now as BP is soft while on nitro  2. HL: reports he stopped his statin 2/2 to myaglias.  -- check lipids -- will switch to crestor 40mg   3. HTN: blood pressures are soft while on nitro. Will hold home HCTZ  4. Tobacco use: still smoking 1ppd. Cessation advised.  Severity of Illness: The appropriate patient status for this patient is INPATIENT. Inpatient status is judged to be reasonable and necessary in order to provide the required intensity of service to ensure the patient's safety. The patient's presenting symptoms, physical exam findings, and initial radiographic and laboratory data in the context of their chronic comorbidities is felt to place them at high risk for further clinical deterioration. Furthermore, it is not anticipated that the patient will be medically stable for discharge from the hospital within 2 midnights of admission. The following factors support the patient status of inpatient.   " The patient's presenting symptoms include chest pain. " The worrisome physical exam findings include stable exam. " The initial radiographic and laboratory data are worrisome because of elevated troponin, new EKG changes. " The chronic co-morbidities include HTN, HL, CAD, tobacco use.   * I certify that at the point of admission it is my clinical judgment that the patient will require inpatient hospital care spanning beyond 2 midnights from the point of admission due to high intensity of service, high risk for further deterioration and high frequency of surveillance required.*    For questions or  updates, please contact [facility] Please consult [email or web address] for contact info under     Signed, , NP  06/23/2020 8:16 AM  As above, patient seen and examined.  Briefly he is a 39 year old male with past medical history of coronary artery disease, hypertension, hyperlipidemia, noncompliance, tobacco abuse with non-ST elevation myocardial infarction.  Patient has had previous inferior infarct with PCI of his right coronary artery and LAD.  He has not been seen since 2018.  He developed chest pain at approximately 9 PM last evening.  It is substernal with radiation to his left upper extremity.  He describes it as a pressure.  No associated nausea, diaphoresis or dyspnea.  He was admitted and now has active ongoing chest pain.  Laboratories show troponin 2292, creatinine 0.98, hemoglobin 16.8.  Electrocardiogram shows sinus rhythm, PVC, inferolateral T wave inversion.  1 non-ST elevation myocardial infarction-patient has persistent pain and positive enzymes.  We will plan to proceed with urgent cardiac catheterization.  The risks and benefits including myocardial infarction, CVA and death discussed and he agrees to proceed.  We will treat with aspirin, heparin and statin.  Add low-dose metoprolol 12.5 mg twice daily.  We will arrange echocardiogram to assess LV function.  2 hyperlipidemia-patient was not taking a statin at time of admission.  Will resume Lipitor 80 mg daily.  Check lipids and liver in 12 weeks.  3 tobacco abuse-patient counseled on discontinuing.  2293, MD

## 2020-06-23 NOTE — ED Notes (Signed)
CRITICAL VALUE ALERT  Critical Value:  Trop 771  Date & Time Notied:  06/23/2020 0530  Provider Notified:  Dr. Blinda Leatherwood  Orders Received/Actions taken: see chart

## 2020-06-23 NOTE — Interval H&P Note (Signed)
History and Physical Interval Note:  06/23/2020 8:59 AM  [name]  has presented today for surgery, with the diagnosis of NSTEMI.  The various methods of treatment have been discussed with the patient and family. After consideration of risks, benefits and other options for treatment, the patient has consented to  Procedure(s): LEFT HEART CATH AND CORONARY ANGIOGRAPHY (N/A) as a surgical intervention.  The patient's history has been reviewed, patient examined, no change in status, stable for surgery.  I have reviewed the patient's chart and labs.  Questions were answered to the patient's satisfaction.    Cath Lab Visit (complete for each Cath Lab visit)  Clinical Evaluation Leading to the Procedure:   ACS: Yes.    Non-ACS:  N/A  Baily Serpe

## 2020-06-23 NOTE — Progress Notes (Signed)
VASCULAR LAB    Pre CABG Dopplers have been performed.  See CV proc for preliminary results.   Merdis Snodgrass, RVT 06/23/2020, 3:04 PM

## 2020-06-23 NOTE — ED Notes (Signed)
Consent obtained and placed at bedside with pt. Zoll pads placed and monitor transported with pt to cath lab 1. Report given to cath lab.

## 2020-06-23 NOTE — ED Notes (Signed)
[facility] reports nitro drip running @ 20 mcg/min. Rate/dose verified by this RN.  Charge nurse made aware that there is no order for nitro drip in MAR. Awaiting cardiology consult.

## 2020-06-23 NOTE — ED Provider Notes (Signed)
[facility] EMERGENCY DEPARTMENT Provider Note   CSN: [id number] Arrival date & time: 06/23/20  0009     History Chief Complaint  Patient presents with  . Chest Pain    [name] is a 39 y.o. male.  Patient presents to the emergency department for evaluation of chest pain.  Patient reports central chest pain that radiates around the left side to the axilla and down the arm.  No associated shortness of breath.  He did have associated nausea and vomited once.  Pain came on at rest, not exertional.  He does, however, think that it feels like when he had his heart attack.        Past Medical History:  Diagnosis Date  . CAD (coronary artery disease)    inferiot MI; LHC with EF 55% and basal to mid inferior hypokinesis. totally occluded RCA w/weak L-R collaterals. 50% ostial OM1. long 70% proximal LAD. Pt had promus DES to LAD and RCA. echo 9/11: EF 55%, no regional wall motion abnormalities   . GI bleed 9/11   secondary to mallory-weiss tear with endoscopic clipping   . Hyperlipidemia   . Hypertension   . Nephrolithiasis   . Transfusion history 2011/2012  . 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 Hyperlipidemia 05/02/2011  . Esophagitis 05/02/2011  . CORONARY ATHEROSCLEROSIS NATIVE CORONARY ARTERY 04/07/2010    Past Surgical History:  Procedure Laterality Date  . CARDIAC SURGERY    . patients states had two stent surgery to his heart  03/30/2010       Family History  Problem Relation Age of Onset  . Rheum arthritis Father   . Coronary artery disease Other        noncontributory for early CAD and MI  . Cancer Mother   . Rheum arthritis Mother   . Diabetes Mother   . Hyperlipidemia Mother   . Hypertension Mother   . Stroke Other     Social History   Tobacco Use  . Smoking status: Current Every Day Smoker    Packs/day: 1.50  . Smokeless tobacco: Never  Used  . Tobacco comment: quit 9/11  Substance Use Topics  . Alcohol use: Yes    Comment: occasional   . Drug use: No    Home Medications Prior to Admission medications   Medication Sig Start Date End Date Taking? Authorizing Provider  aspirin EC 81 MG tablet Take 162mg  daily--this will be two 81mg  tablets daily 05/01/11   , MD  atorvastatin (LIPITOR) 80 MG tablet Take 1 tablet (80 mg total) by mouth daily at 6 PM. 06/24/17   Crenshaw, Laurey Morale, MD  hydrochlorothiazide (MICROZIDE) 12.5 MG capsule Take 1 capsule (12.5 mg total) by mouth daily. 06/24/17   Madolyn Frieze, MD  HYDROcodone-acetaminophen (NORCO/VICODIN) 5-325 MG tablet Take 1 tablet by mouth every 6 (six) hours as needed for severe pain. 07/24/18   Lewayne Bunting, MD  LORazepam (ATIVAN) 0.5 MG tablet Take 0.5 mg by mouth 2 (two) times daily. 06/17/18   [provider]  nitroGLYCERIN (NITROSTAT) 0.4 MG SL tablet Place 1 tablet (0.4 mg total) under the tongue every 5 (five) minutes as needed for chest pain. 04/23/14   08/17/18, NP    Allergies    Penicillins  Review of Systems   Review of Systems  Cardiovascular: Positive for chest pain.  Gastrointestinal: Positive  for nausea.  All other systems reviewed and are negative.   Physical Exam Updated Vital Signs BP (!) 98/55 (BP Location: Right Arm)   Pulse (!) 56   Temp 97.8 F (36.6 C) (Oral)   Resp 20   Ht 5\' 9"  (1.753 m)   Wt 104.3 kg   SpO2 96%   BMI 33.97 kg/m   Physical Exam Vitals and nursing note reviewed.  Constitutional:      General: He is not in acute distress.    Appearance: Normal appearance. He is well-developed.  HENT:     Head: Normocephalic and atraumatic.     Right Ear: Hearing normal.     Left Ear: Hearing normal.     Nose: Nose normal.  Eyes:     Conjunctiva/sclera: Conjunctivae normal.     Pupils: Pupils are equal, round, and reactive to light.  Cardiovascular:     Rate and Rhythm: Regular rhythm.      Heart sounds: S1 normal and S2 normal. No murmur heard.  No friction rub. No gallop.   Pulmonary:     Effort: Pulmonary effort is normal. No respiratory distress.     Breath sounds: Normal breath sounds.  Chest:     Chest wall: No tenderness.  Abdominal:     General: Bowel sounds are normal.     Palpations: Abdomen is soft.     Tenderness: There is no abdominal tenderness. There is no guarding or rebound. Negative signs include Murphy's sign and McBurney's sign.     Hernia: No hernia is present.  Musculoskeletal:        General: Normal range of motion.     Cervical back: Normal range of motion and neck supple.  Skin:    General: Skin is warm and dry.     Findings: No rash.  Neurological:     Mental Status: He is alert and oriented to person, place, and time.     GCS: GCS eye subscore is 4. GCS verbal subscore is 5. GCS motor subscore is 6.     Cranial Nerves: No cranial nerve deficit.     Sensory: No sensory deficit.     Coordination: Coordination normal.  Psychiatric:        Speech: Speech normal.        Behavior: Behavior normal.        Thought Content: Thought content normal.     ED Results / Procedures / Treatments   Labs (all labs ordered are listed, but only abnormal results are displayed) Labs Reviewed  BASIC METABOLIC PANEL - Abnormal; Notable for the following components:      Result Value   Potassium 3.4 (*)    Chloride 97 (*)    Glucose, Bld 134 (*)    All other components within normal limits  CBC - Abnormal; Notable for the following components:   WBC 11.4 (*)    All other components within normal limits  TROPONIN I (HIGH SENSITIVITY) - Abnormal; Notable for the following components:   Troponin I (High Sensitivity) 771 (*)    All other components within normal limits  SARS CORONAVIRUS 2 BY RT PCR (HOSPITAL ORDER, PERFORMED IN [facility])  HEPARIN LEVEL (UNFRACTIONATED)  TROPONIN I (HIGH SENSITIVITY)    EKG EKG  Interpretation  Date/Time:  Thursday June 23 2020 00:20:08 EDT Ventricular Rate:  65 PR Interval:  168 QRS Duration: 100 QT Interval:  416 QTC Calculation: 432 R Axis:   49 Text Interpretation: Sinus rhythm with  occasional Premature ventricular complexes Low voltage QRS Inferior infarct , age undetermined Cannot rule out Anteroseptal infarct , age undetermined Abnormal ECG Confirmed by Gilda Crease [id number]) on 06/23/2020 4:30:30 AM   Radiology DG Chest Port 1 View  Result Date: 06/23/2020 CLINICAL DATA:  Chest pain radiating to the left arm and back. Shortness of breath and nausea. Current smoker. EXAM: PORTABLE CHEST 1 VIEW COMPARISON:  07/24/2018 FINDINGS: The heart size and mediastinal contours are within normal limits. Both lungs are clear. The visualized skeletal structures are unremarkable. IMPRESSION: No active disease. Electronically Signed   By: Burman Nieves M.D.   On: 06/23/2020 01:26    Procedures Procedures (including critical care time)  Medications Ordered in ED Medications  nitroGLYCERIN (NITROSTAT) SL tablet 0.4 mg (0.4 mg Sublingual Given 06/23/20 0436)  heparin bolus via infusion 4,000 Units (has no administration in time range)  heparin ADULT infusion 100 units/mL (25000 units/25mL sodium chloride 0.45%) (has no administration in time range)  aspirin chewable tablet 324 mg (324 mg Oral Given 06/23/20 0436)  sodium chloride 0.9 % bolus 1,000 mL (1,000 mLs Intravenous New Bag/Given 06/23/20 0530)    ED Course  I have reviewed the triage vital signs and the nursing notes.  Pertinent labs & imaging results that were available during my care of the patient were reviewed by me and considered in my medical decision making (see chart for details).    MDM Rules/Calculators/A&P                          Patient presents to the emergency department for evaluation of chest pain.  Patient began to have pain at rest earlier tonight.  He does have coronary artery  disease history, having had MI with stenting of RCA and LAD in 2012..   Initial EKG with ST segment depressions and inverted T waves in inferior leads with sagging lateral ST depression as well. Patient administered aspirin and a nitroglycerin. Blood pressure dropped and therefore no further nitro initiated. Patient given fluid bolus. Around this time his first troponin came back over 700. Discussed with cardiology, will require transfer to [facility]. Likely will require urgent catheterization, therefore will transfer ER to ER as patient is still actively having chest pain with EKG changes.   CRITICAL CARE Performed by: Gilda Crease   Total critical care time: 35 minutes  Critical care time was exclusive of separately billable procedures and treating other patients.  Critical care was necessary to treat or prevent imminent or life-threatening deterioration.  Critical care was time spent personally by me on the following activities: development of treatment plan with patient and/or surrogate as well as nursing, discussions with consultants, evaluation of patient's response to treatment, examination of patient, obtaining history from patient or surrogate, ordering and performing treatments and interventions, ordering and review of laboratory studies, ordering and review of radiographic studies, pulse oximetry and re-evaluation of patient's condition.  Final Clinical Impression(s) / ED Diagnoses Final diagnoses:  NSTEMI (non-ST elevated myocardial infarction) [facility])    Rx / DC Orders ED Discharge Orders    None       Ruble Buttler, Canary Brim, MD 06/23/20 [id number]

## 2020-06-23 NOTE — Progress Notes (Signed)
ANTICOAGULATION CONSULT NOTE - Initial Consult  Pharmacy Consult for heparin Indication: IABP  Allergies  Allergen Reactions  . Penicillins Anaphylaxis, Swelling and Rash    Has patient had a PCN reaction causing immediate rash, facial/tongue/throat swelling, SOB or lightheadedness with hypotension: Yes Has patient had a PCN reaction causing severe rash involving mucus membranes or skin necrosis: Yes Has patient had a PCN reaction that required hospitalization: No Has patient had a PCN reaction occurring within the last 10 years: No If all of the above answers are "NO", then may proceed with Cephalosporin use.     Patient Measurements: Height: 5\' 9"  (175.3 cm) Weight: 104.3 kg (230 lb) IBW/kg (Calculated) : 70.7 Heparin Dosing Weight: 93.2 kg   Vital Signs: Temp: 98 F (36.7 C) (09/09 0732) Temp Source: Oral (09/09 0732) BP: 147/74 (09/09 1130) Pulse Rate: 93 (09/09 1130)  Labs: Recent Labs    06/23/20 0400 06/23/20 0552  HGB 16.8  --   HCT 49.6  --   PLT 352  --   CREATININE 0.98  --   TROPONINIHS 771* 2,292*    Estimated Creatinine Clearance: 120.4 mL/min (by C-G formula based on SCr of 0.98 mg/dL).   Medical History: Past Medical History:  Diagnosis Date  . CAD (coronary artery disease)    inferiot MI; LHC with EF 55% and basal to mid inferior hypokinesis. totally occluded RCA w/weak L-R collaterals. 50% ostial OM1. long 70% proximal LAD. Pt had promus DES to LAD and RCA. echo 9/11: EF 55%, no regional wall motion abnormalities   . GI bleed 9/11   secondary to mallory-weiss tear with endoscopic clipping   . Hyperlipidemia   . Hypertension   . Nephrolithiasis   . Transfusion history 2011/2012  . 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 [START ON 06/24/2020] aspirin EC  81 mg Oral Daily  . carvedilol  3.125 mg Oral BID WC  . furosemide  40 mg Intravenous BID  . potassium chloride   40 mEq Oral Once  . rosuvastatin  40 mg Oral Daily  . sodium chloride flush  3 mL Intravenous Q12H    Assessment: 38 yom with a hx of CAD s/p PCI of RCA and LAD in 2011 and hx GI found to have NSTEMI.   Found to have 3v CAD with severely reduced LVEF 25-30% with LVEDP ~30 mmHg now s/p IABP placement. Okay to start heparin 2 hours after radial sheath removal (removal time documented on 06/23/20 0951). No s/sx of bleeding. Hgb 16.8, plt 352. Trop 08/23/20.   Goal of Therapy:  Heparin level 0.2-0.5 units/ml Monitor platelets by anticoagulation protocol: Yes   Plan:  Start heparin infusion at 1000 units/hr now Will order heparin level in 6 hours Monitor daily HL, CBC, and for s/sx of bleeding   [id number], PharmD, BCCCP Clinical Pharmacist  Phone: [phone] 06/23/2020 12:29 PM  Please check AMION for all [facility] Pharmacy phone numbers After 10:00 PM, call Main Pharmacy [phone]

## 2020-06-23 NOTE — Progress Notes (Signed)
Orthopedic Tech Progress Note Patient Details:  [name] 14-Jul-1981 [id number]  Ortho Devices Type of Ortho Device: Knee Immobilizer Ortho Device/Splint Location: LRE Ortho Device/Splint Interventions: Application, Ordered   Post Interventions Patient Tolerated: Well   [name] A Nusaiba Guallpa 06/23/2020, 1:09 PM

## 2020-06-23 NOTE — ED Triage Notes (Addendum)
Pt bib [facility] from [facility]. NSTEMI with chest pain characterized as sharp left chest pain that radiates to left back arm. Hx of MI in 2011. 4000 Heparin bolus given and drip started @ 12 units/hr. Nirto started at an hour which decreased chest pain from a 7/10 to 5/10. Nitro then bumped up to 20 mcg with no chest pain decrease. 1L fluid given for low BP. 324 of ASA given.   Troponin 700 to 2000 Vitals: BP: 116/79 O2; 99% on 3L for comfort

## 2020-06-23 NOTE — ED Notes (Signed)
Patient's monitor was alarming, upon entering room patient's blood pressure reading 89/57. Recycled BP 98/55. Patient states that he is having some chest pains at this time . States pain is 5 out of 10. Patient states pain is center of chest. Placed patient on 12 lead and captured EKG.

## 2020-06-23 NOTE — Progress Notes (Signed)
ANTICOAGULATION CONSULT NOTE - Initial Consult  Pharmacy Consult for heparin Indication: chest pain/ACS  Allergies  Allergen Reactions  . Penicillins Anaphylaxis, Swelling and Rash    Has patient had a PCN reaction causing immediate rash, facial/tongue/throat swelling, SOB or lightheadedness with hypotension: Yes Has patient had a PCN reaction causing severe rash involving mucus membranes or skin necrosis: Yes Has patient had a PCN reaction that required hospitalization: No Has patient had a PCN reaction occurring within the last 10 years: No If all of the above answers are "NO", then may proceed with Cephalosporin use.     Patient Measurements: Height: 5\' 9"  (175.3 cm) Weight: 104.3 kg (230 lb) IBW/kg (Calculated) : 70.7 Heparin Dosing Weight: 93.2  Vital Signs: Temp: 97.8 F (36.6 C) (09/09 0021) Temp Source: Oral (09/09 0021) BP: 98/55 (09/09 0508) Pulse Rate: 56 (09/09 0508)  Labs: Recent Labs    06/23/20 0400  HGB 16.8  HCT 49.6  PLT 352  CREATININE 0.98  TROPONINIHS 771*    Estimated Creatinine Clearance: 120.4 mL/min (by C-G formula based on SCr of 0.98 mg/dL).   Medical History: Past Medical History:  Diagnosis Date  . CAD (coronary artery disease)    inferiot MI; LHC with EF 55% and basal to mid inferior hypokinesis. totally occluded RCA w/weak L-R collaterals. 50% ostial OM1. long 70% proximal LAD. Pt had promus DES to LAD and RCA. echo 9/11: EF 55%, no regional wall motion abnormalities   . GI bleed 9/11   secondary to mallory-weiss tear with endoscopic clipping   . Hyperlipidemia   . Hypertension   . Nephrolithiasis   . Transfusion history 2011/2012  . 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  He was not on anticoagulation PTA.  Hg 16.8, PTLC 352 Goal of Therapy:  Heparin level 0.3-0.7 units/ml Monitor platelets  by anticoagulation protocol: Yes   Plan:  Heparin bolus 4000 units and drip at 1200 u ts/hr Check heparin level 6 hours after start Daily HL and CBC Monitor for bleeding complications  Julie Nay Poteet 06/23/2020,5:33 AM

## 2020-06-23 NOTE — Progress Notes (Signed)
Arrived at patient's room and he has 2 pivs at this time that are patent. I was unable to reach the nurse to clarify IV team order. I did relay the message to the charge RN if the patient needed more access other then the two IV sites then maybe a central line or picc line would be more appropriate.

## 2020-06-23 NOTE — ED Triage Notes (Signed)
Pt arrives via POV w/complaints of chest pain that started 30 min ago. Pt states he has hx of MI in 2011 & states this feels the same. Pt states pain radiates to his L arm & back. Pt states he is SOB & nauseous.

## 2020-06-23 NOTE — Consult Note (Signed)
[address]       [place] [address]             [phone]        SPERO GUNNELS [facility] Medical Record #[id number] Date of Birth: October 08, 1981  Referring: No ref. provider found Primary Care: April Manson, NP Primary Cardiologist:Brian Jens Som, MD  Chief Complaint:    Chief Complaint  Patient presents with  . Chest Pain    History of Present Illness:      39 year old man status post PCI 10 years ago who has been lost to follow-up recently.  He experienced sudden onset of chest pain with radiation to his left arm last evening.  He presented to the [facility] emergency department overnight where he was diagnosed with NSTEMI.  He was transferred to [facility] for cardiac catheterization today.  This demonstrates severe multivessel coronary artery disease including a total LAD.  He has depressed ejection fraction but remains hemodynamically stable.  On the table his pain which was 10 out of 10 is now 2 out of 10.  Consultation is received for cardiac surgery.  Up to this point he is continue to work.  Current Activity/ Functional Status: Patient will be independent with mobility/ambulation, transfers, ADL's, IADL's.   Zubrod Score: At the time of surgery this patient's most appropriate activity status/level should be described as: []     0    Normal activity, no symptoms []     1    Restricted in physical strenuous activity but ambulatory, able to do out light work []     2    Ambulatory and capable of self care, unable to do work activities, up and about                 more than 50%  Of the time                            []     3    Only limited self care, in bed greater than 50% of waking hours []     4    Completely disabled, no self care, confined to bed or chair []     5    Moribund  Past Medical History:  Diagnosis Date  . CAD (coronary artery disease)    inferiot MI; LHC with EF 55% and basal to mid inferior hypokinesis. totally occluded  RCA w/weak L-R collaterals. 50% ostial OM1. long 70% proximal LAD. Pt had promus DES to LAD and RCA. echo 9/11: EF 55%, no regional wall motion abnormalities   . GI bleed 9/11   secondary to mallory-weiss tear with endoscopic clipping   . Hyperlipidemia   . Hypertension   . Nephrolithiasis   . Transfusion history 2011/2012  . 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 CARDIAC SURGERY    . IABP INSERTION N/A 06/23/2020   Procedure: IABP Insertion;  Surgeon: 11/11, MD;  Location: [facility] INVASIVE CV LAB;  Service: Cardiovascular;  Laterality: N/A;  . LEFT HEART CATH AND CORONARY ANGIOGRAPHY N/A 06/23/2020   Procedure: LEFT HEART CATH AND CORONARY ANGIOGRAPHY;  Surgeon: 09/2011, MD;  Location: [facility] INVASIVE CV LAB;  Service: Cardiovascular;  Laterality: N/A;  . patients states had two stent surgery to his heart  03/30/2010    Social History  Tobacco Use  Smoking Status Current Every Day Smoker  . Packs/day: 1.50  Smokeless Tobacco Never Used  Tobacco Comment   quit 9/11    Social History   Substance and Sexual Activity  Alcohol Use Yes   Comment: occasional      Allergies  Allergen Reactions  . Penicillins Anaphylaxis, Swelling and Rash    Has patient had a PCN reaction causing immediate rash, facial/tongue/throat swelling, SOB or lightheadedness with hypotension: Yes Has patient had a PCN reaction causing severe rash involving mucus membranes or skin necrosis: Yes Has patient had a PCN reaction that required hospitalization: No Has patient had a PCN reaction occurring within the last 10 years: No If all of the above answers are "NO", then may proceed with Cephalosporin use.     Current Facility-Administered Medications  Medication Dose Route Frequency Provider Last Rate Last Admin  . 0.9 %  sodium chloride infusion  250 mL Intravenous PRN End, Cristal Deer, MD 10  mL/hr at 06/23/20 1400 Rate Verify at 06/23/20 1400  . acetaminophen (TYLENOL) tablet 650 mg  650 mg Oral Q4H PRN Laverda Page B, NP      . amiodarone (NEXTERONE) 1.8 mg/mL load via infusion 150 mg  150 mg Intravenous Once Laverda Page B, NP       Followed by  . amiodarone (NEXTERONE PREMIX) 360-4.14 MG/200ML-% (1.8 mg/mL) IV infusion  60 mg/hr Intravenous Continuous Laverda Page B, NP       Followed by  . amiodarone (NEXTERONE PREMIX) 360-4.14 MG/200ML-% (1.8 mg/mL) IV infusion  30 mg/hr Intravenous Continuous Arty Baumgartner, NP      . Melene Muller ON 06/24/2020] aspirin EC tablet 81 mg  81 mg Oral Daily Laverda Page B, NP      . carvedilol (COREG) tablet 3.125 mg  3.125 mg Oral BID WC End, Christopher, MD      . furosemide (LASIX) injection 40 mg  40 mg Intravenous BID End, Christopher, MD   40 mg at 06/23/20 1305  . heparin ADULT infusion 100 units/mL (25000 units/281mL sodium chloride 0.45%)  1,000 Units/hr Intravenous Continuous Lewayne Bunting, MD 10 mL/hr at 06/23/20 1400 1,000 Units/hr at 06/23/20 1400  . hydrALAZINE (APRESOLINE) injection 10 mg  10 mg Intravenous Q20 Min PRN End, Cristal Deer, MD      . nitroGLYCERIN (NITROSTAT) SL tablet 0.4 mg  0.4 mg Sublingual Q5 min PRN End, Cristal Deer, MD   0.4 mg at 06/23/20 0436  . nitroGLYCERIN 50 mg in dextrose 5 % 250 mL (0.2 mg/mL) infusion  0-200 mcg/min Intravenous Titrated End, Christopher, MD 6 mL/hr at 06/23/20 1400 20 mcg/min at 06/23/20 1400  . ondansetron (ZOFRAN) injection 4 mg  4 mg Intravenous Q6H PRN Laverda Page B, NP      . rosuvastatin (CRESTOR) tablet 40 mg  40 mg Oral Daily Laverda Page B, NP   40 mg at 06/23/20 1306  . sodium chloride flush (NS) 0.9 % injection 3 mL  3 mL Intravenous Q12H End, Christopher, MD   3 mL at 06/23/20 1305  . sodium chloride flush (NS) 0.9 % injection 3 mL  3 mL Intravenous PRN End, Christopher, MD        Medications Prior to Admission  Medication Sig Dispense Refill Last  Dose  . aspirin EC 81 MG tablet Take 162mg  daily--this will be two 81mg  tablets daily   06/21/2020  . hydrochlorothiazide (MICROZIDE) 12.5 MG capsule Take 1 capsule (12.5 mg total) by mouth daily. 90 capsule 3 06/21/2020  .  LORazepam (ATIVAN) 0.5 MG tablet Take 0.5 mg by mouth 2 (two) times daily.  2 06/21/2020  . atorvastatin (LIPITOR) 80 MG tablet Take 1 tablet (80 mg total) by mouth daily at 6 PM. (Patient not taking: Reported on 06/23/2020) 90 tablet 3 Not Taking  . HYDROcodone-acetaminophen (NORCO/VICODIN) 5-325 MG tablet Take 1 tablet by mouth every 6 (six) hours as needed for severe pain. (Patient not taking: Reported on 06/23/2020) 10 tablet 0 Not Taking at Unknown time  . nitroGLYCERIN (NITROSTAT) 0.4 MG SL tablet Place 1 tablet (0.4 mg total) under the tongue every 5 (five) minutes as needed for chest pain. 25 tablet 3 UNK    Family History  Problem Relation Age of Onset  . Rheum arthritis Father   . Coronary artery disease Other        noncontributory for early CAD and MI  . Cancer Mother   . Rheum arthritis Mother   . Diabetes Mother   . Hyperlipidemia Mother   . Hypertension Mother   . Stroke Other      Review of Systems:   ROS A comprehensive review of systems was negative.     Cardiac Review of Systems: Y or  [    ]= no  Chest Pain [    ]  Resting SOB [   ] Exertional SOB  [  ]  Orthopnea [  ]   Pedal Edema [   ]    Palpitations [  ] Syncope  [  ]   Presyncope [   ]  General Review of Systems: [Y] = yes [  ]=no Constitional: recent weight change [  ]; anorexia [  ]; fatigue [  ]; nausea [  ]; night sweats [  ]; fever [  ]; or chills [  ]                                                               Dental: Last Dentist visit:   Eye : blurred vision [  ]; diplopia [   ]; vision changes [  ];  Amaurosis fugax[  ]; Resp: cough [  ];  wheezing[  ];  hemoptysis[  ]; shortness of breath[  ]; paroxysmal nocturnal dyspnea[  ]; dyspnea on exertion[  ]; or orthopnea[  ];  GI:   gallstones[  ], vomiting[  ];  dysphagia[  ]; melena[  ];  hematochezia [  ]; heartburn[  ];   Hx of  Colonoscopy[  ]; GU: kidney stones [  ]; hematuria[  ];   dysuria [  ];  nocturia[  ];  history of     obstruction [  ]; urinary frequency [  ]             Skin: rash, swelling[  ];, hair loss[  ];  peripheral edema[  ];  or itching[  ]; Musculosketetal: myalgias[  ];  joint swelling[  ];  joint erythema[  ];  joint pain[  ];  back pain[  ];  Heme/Lymph: bruising[  ];  bleeding[  ];  anemia[  ];  Neuro: TIA[  ];  headaches[  ];  stroke[  ];  vertigo[  ];  seizures[  ];   paresthesias[  ];  difficulty walking[  ];  Psych:depression[  ]; anxiety[  ];  Endocrine: diabetes[  ];  thyroid dysfunction[  ];                Physical Exam: BP (!) 102/57 (BP Location: Left Arm)   Pulse 93   Temp 98 F (36.7 C) (Oral)   Resp (!) 22   Ht 5\' 9"  (1.753 m)   Wt 104.3 kg   SpO2 98%   BMI 33.97 kg/m    General appearance: alert and cooperative Head: Normocephalic, without obvious abnormality, atraumatic Neck: no adenopathy, no carotid bruit, no JVD, supple, symmetrical, trachea midline and thyroid not enlarged, symmetric, no tenderness/mass/nodules Resp: clear to auscultation bilaterally Cardio: irregularly irregular rhythm GI: soft, non-tender; bowel sounds normal; no masses,  no organomegaly Extremities: extremities normal, atraumatic, no cyanosis or edema Neurologic: Alert and oriented X 3, normal strength and tone. Normal symmetric reflexes. Normal coordination and gait  Diagnostic Studies & Laboratory data:     Recent Radiology Findings:   CARDIAC CATHETERIZATION  Result Date: 06/23/2020 Conclusions: 1. Severe three-vessel coronary artery disease, including occluded proximal/mid LAD stent with minimal collateral filling of the mid/distal LAD, sequential 90% and 70% mid/distal LCx lesions, and chronic total occlusion of mid RCA stent with bridging and left-to-right collaterals. 2. Severely  reduced left ventricular systolic function (LVEF 25-30%) with moderately elevated filling pressure (LVEDP ~30 mmHg). 3. Successful placement of 50 mL IABP via the right common femoral artery. Recommendations: 1. Transfer to 2H-ICU for continued medical therapy, including diuresis.  Images reviewed in person with Dr. Vickey SagesAtkins with tentative plans for CABG tomorrow. 2. Restart heparin infusion in 2 hours. 3. Initiate furosemide 40 mg IV BID. 4. Start carvedilol 3.125 mg BID.  Defer adding ACEI/ARB until after surgery. 5. Aggressive secondary prevention, including high-intensity statin therapy and tobacco cessation. 6. Obtain echocardiogram. Yvonne Kendallhristopher End, MD [facility]   DG CHEST PORT 1 VIEW  Result Date: 06/23/2020 CLINICAL DATA:  Chest pain EXAM: PORTABLE CHEST 1 VIEW COMPARISON:  Film from earlier in the same day. FINDINGS: Intra-aortic balloon pump is now been placed with the tip over the aortic knob. Cardiac shadow is at the upper limits of normal but accentuated by the portable technique. Lungs are well aerated without focal infiltrate or sizable effusion. No bony abnormality is noted. IMPRESSION: Intra-aortic balloon pump in adequate position. No other focal abnormality is noted. Electronically Signed   By: Alcide CleverMark  Lukens M.D.   On: 06/23/2020 12:17   DG Chest Port 1 View  Result Date: 06/23/2020 CLINICAL DATA:  Chest pain radiating to the left arm and back. Shortness of breath and nausea. Current smoker. EXAM: PORTABLE CHEST 1 VIEW COMPARISON:  07/24/2018 FINDINGS: The heart size and mediastinal contours are within normal limits. Both lungs are clear. The visualized skeletal structures are unremarkable. IMPRESSION: No active disease. Electronically Signed   By: Burman NievesWilliam  Stevens M.D.   On: 06/23/2020 01:26     I have independently reviewed the above radiologic studies and discussed with the patient   Recent Lab Findings: Lab Results  Component Value Date   WBC 11.4 (H) 06/23/2020   HGB 16.8  06/23/2020   HCT 49.6 06/23/2020   PLT 352 06/23/2020   GLUCOSE 97 06/23/2020   CHOL 173 06/11/2014   TRIG 243.0 (H) 06/11/2014   HDL 34.10 (L) 06/11/2014   LDLDIRECT 102.9 06/11/2014   LDLCALC 184 (H) 04/23/2014   ALT 47 (H) 06/23/2020   AST 179 (H) 06/23/2020   NA 136 06/23/2020  K 4.0 06/23/2020   CL 104 06/23/2020   CREATININE 0.84 06/23/2020   BUN 7 06/23/2020   CO2 21 (L) 06/23/2020   INR 0.89 11/30/2010      Assessment / Plan:      39 year old with longstanding history of coronary artery disease now with NSTEMI and severe recurrent disease.  Suggest placement of intra-aortic balloon pump as done and diuresis overnight to better prepare for surgery in the morning.  Agree with administration of amiodarone for dysrhythmias. He has had the opportunity to ask questions regarding his condition and the surgery which are answered to his apparent satisfaction.  He wishes to proceed with the CABG surgery on 06/24/2020    I  spent 40 minutes counseling the patient face to face.   Brantley Fling, MD 06/23/2020 3:01 PM

## 2020-06-23 NOTE — Progress Notes (Signed)
  Echocardiogram 2D Echocardiogram has been performed.  Taurus Alamo G Madyx Delfin 06/23/2020, 1:51 PM

## 2020-06-23 NOTE — Plan of Care (Signed)
06/23/2020-Educated patient on pre-op plans and workup as well as post op care management. Patient did not want to watch videos on cardiac surgery. Practiced incentive spirometry; pt acheiving > 2000 on I/S.

## 2020-06-24 ENCOUNTER — Inpatient Hospital Stay (HOSPITAL_COMMUNITY): Payer: Self-pay | Admitting: Certified Registered"

## 2020-06-24 ENCOUNTER — Inpatient Hospital Stay (HOSPITAL_COMMUNITY): Payer: Self-pay

## 2020-06-24 ENCOUNTER — Encounter (HOSPITAL_COMMUNITY)
Admission: EM | Disposition: A | Discharge status: Home / Self Care | Payer: Self-pay | Source: Home / Self Care | Attending: Cardiothoracic Surgery

## 2020-06-24 DIAGNOSIS — Z951 Presence of aortocoronary bypass graft: Secondary | ICD-10-CM

## 2020-06-24 DIAGNOSIS — I2510 Atherosclerotic heart disease of native coronary artery without angina pectoris: Secondary | ICD-10-CM | POA: Diagnosis present

## 2020-06-24 HISTORY — PX: TEE WITHOUT CARDIOVERSION: SHX5443

## 2020-06-24 HISTORY — PX: RADIAL ARTERY HARVEST: SHX5067

## 2020-06-24 HISTORY — PX: ENDOVEIN HARVEST OF GREATER SAPHENOUS VEIN: SHX5059

## 2020-06-24 HISTORY — PX: CORONARY ARTERY BYPASS GRAFT: SHX141

## 2020-06-24 LAB — POCT I-STAT 7, (LYTES, BLD GAS, ICA,H+H)
Acid-Base Excess: 5 mmol/L — ABNORMAL HIGH (ref 0.0–2.0)
Acid-Base Excess: 3 mmol/L — ABNORMAL HIGH (ref 0.0–2.0)
Acid-Base Excess: 5 mmol/L — ABNORMAL HIGH (ref 0.0–2.0)
Acid-Base Excess: 3 mmol/L — ABNORMAL HIGH (ref 0.0–2.0)
Acid-Base Excess: 0 mmol/L (ref 0.0–2.0)
Acid-Base Excess: 0 mmol/L (ref 0.0–2.0)
Bicarbonate: 27.9 mmol/L (ref 20.0–28.0)
Bicarbonate: 27.6 mmol/L (ref 20.0–28.0)
Bicarbonate: 31 mmol/L — ABNORMAL HIGH (ref 20.0–28.0)
Bicarbonate: 27.8 mmol/L (ref 20.0–28.0)
Bicarbonate: 26.3 mmol/L (ref 20.0–28.0)
Bicarbonate: 25.8 mmol/L (ref 20.0–28.0)
Calcium, Ion: 1.13 mmol/L — ABNORMAL LOW (ref 1.15–1.40)
Calcium, Ion: 1.13 mmol/L — ABNORMAL LOW (ref 1.15–1.40)
Calcium, Ion: 0.96 mmol/L — ABNORMAL LOW (ref 1.15–1.40)
Calcium, Ion: 1.05 mmol/L — ABNORMAL LOW (ref 1.15–1.40)
Calcium, Ion: 1.06 mmol/L — ABNORMAL LOW (ref 1.15–1.40)
Calcium, Ion: 1.11 mmol/L — ABNORMAL LOW (ref 1.15–1.40)
HCT: 42 % (ref 39.0–52.0)
HCT: 43 % (ref 39.0–52.0)
HCT: 33 % — ABNORMAL LOW (ref 39.0–52.0)
HCT: 32 % — ABNORMAL LOW (ref 39.0–52.0)
HCT: 33 % — ABNORMAL LOW (ref 39.0–52.0)
HCT: 35 % — ABNORMAL LOW (ref 39.0–52.0)
Hemoglobin: 14.3 g/dL (ref 13.0–17.0)
Hemoglobin: 14.6 g/dL (ref 13.0–17.0)
Hemoglobin: 11.2 g/dL — ABNORMAL LOW (ref 13.0–17.0)
Hemoglobin: 10.9 g/dL — ABNORMAL LOW (ref 13.0–17.0)
Hemoglobin: 11.2 g/dL — ABNORMAL LOW (ref 13.0–17.0)
Hemoglobin: 11.9 g/dL — ABNORMAL LOW (ref 13.0–17.0)
O2 Saturation: 96 %
O2 Saturation: 100 %
O2 Saturation: 100 %
O2 Saturation: 100 %
O2 Saturation: 99 %
O2 Saturation: 94 %
Patient temperature: 98.4
Patient temperature: 36
Potassium: 3.1 mmol/L — ABNORMAL LOW (ref 3.5–5.1)
Potassium: 3.1 mmol/L — ABNORMAL LOW (ref 3.5–5.1)
Potassium: 3.6 mmol/L (ref 3.5–5.1)
Potassium: 4.2 mmol/L (ref 3.5–5.1)
Potassium: 3.8 mmol/L (ref 3.5–5.1)
Potassium: 4 mmol/L (ref 3.5–5.1)
Sodium: 136 mmol/L (ref 135–145)
Sodium: 137 mmol/L (ref 135–145)
Sodium: 139 mmol/L (ref 135–145)
Sodium: 136 mmol/L (ref 135–145)
Sodium: 139 mmol/L (ref 135–145)
Sodium: 139 mmol/L (ref 135–145)
TCO2: 29 mmol/L (ref 22–32)
TCO2: 29 mmol/L (ref 22–32)
TCO2: 33 mmol/L — ABNORMAL HIGH (ref 22–32)
TCO2: 29 mmol/L (ref 22–32)
TCO2: 28 mmol/L (ref 22–32)
TCO2: 27 mmol/L (ref 22–32)
pCO2 arterial: 35.5 mmHg (ref 32.0–48.0)
pCO2 arterial: 41.3 mmHg (ref 32.0–48.0)
pCO2 arterial: 54.4 mmHg — ABNORMAL HIGH (ref 32.0–48.0)
pCO2 arterial: 42.9 mmHg (ref 32.0–48.0)
pCO2 arterial: 47.2 mmHg (ref 32.0–48.0)
pCO2 arterial: 45 mmHg (ref 32.0–48.0)
pH, Arterial: 7.504 — ABNORMAL HIGH (ref 7.350–7.450)
pH, Arterial: 7.433 (ref 7.350–7.450)
pH, Arterial: 7.363 (ref 7.350–7.450)
pH, Arterial: 7.42 (ref 7.350–7.450)
pH, Arterial: 7.354 (ref 7.350–7.450)
pH, Arterial: 7.361 (ref 7.350–7.450)
pO2, Arterial: 70 mmHg — ABNORMAL LOW (ref 83.0–108.0)
pO2, Arterial: 247 mmHg — ABNORMAL HIGH (ref 83.0–108.0)
pO2, Arterial: 332 mmHg — ABNORMAL HIGH (ref 83.0–108.0)
pO2, Arterial: 320 mmHg — ABNORMAL HIGH (ref 83.0–108.0)
pO2, Arterial: 126 mmHg — ABNORMAL HIGH (ref 83.0–108.0)
pO2, Arterial: 69 mmHg — ABNORMAL LOW (ref 83.0–108.0)

## 2020-06-24 LAB — BASIC METABOLIC PANEL
Anion gap: 10 (ref 5–15)
BUN: 7 mg/dL (ref 6–20)
CO2: 26 mmol/L (ref 22–32)
Calcium: 8.7 mg/dL — ABNORMAL LOW (ref 8.9–10.3)
Chloride: 100 mmol/L (ref 98–111)
Creatinine, Ser: 0.95 mg/dL (ref 0.61–1.24)
GFR calc Af Amer: >60 mL/min (ref >60)
GFR calc non Af Amer: >60 mL/min (ref >60)
Glucose, Bld: 111 mg/dL — ABNORMAL HIGH (ref 70–99)
Potassium: 3 mmol/L — ABNORMAL LOW (ref 3.5–5.1)
Sodium: 136 mmol/L (ref 135–145)

## 2020-06-24 LAB — CBC
HCT: 43.5 % (ref 39.0–52.0)
HCT: 37.7 % — ABNORMAL LOW (ref 39.0–52.0)
HCT: 36.9 % — ABNORMAL LOW (ref 39.0–52.0)
Hemoglobin: 14.6 g/dL (ref 13.0–17.0)
Hemoglobin: 12.5 g/dL — ABNORMAL LOW (ref 13.0–17.0)
Hemoglobin: 12.5 g/dL — ABNORMAL LOW (ref 13.0–17.0)
MCH: 30.1 pg (ref 26.0–34.0)
MCH: 30.7 pg (ref 26.0–34.0)
MCH: 31 pg (ref 26.0–34.0)
MCHC: 33.6 g/dL (ref 30.0–36.0)
MCHC: 33.2 g/dL (ref 30.0–36.0)
MCHC: 33.9 g/dL (ref 30.0–36.0)
MCV: 89.7 fL (ref 80.0–100.0)
MCV: 92.6 fL (ref 80.0–100.0)
MCV: 91.6 fL (ref 80.0–100.0)
Platelets: 274 10*3/uL (ref 150–400)
Platelets: 180 10*3/uL (ref 150–400)
Platelets: 204 10*3/uL (ref 150–400)
RBC: 4.85 MIL/uL (ref 4.22–5.81)
RBC: 4.07 MIL/uL — ABNORMAL LOW (ref 4.22–5.81)
RBC: 4.03 MIL/uL — ABNORMAL LOW (ref 4.22–5.81)
RDW: 13.7 % (ref 11.5–15.5)
RDW: 13.7 % (ref 11.5–15.5)
RDW: 13.4 % (ref 11.5–15.5)
WBC: 12.6 10*3/uL — ABNORMAL HIGH (ref 4.0–10.5)
WBC: 15.3 10*3/uL — ABNORMAL HIGH (ref 4.0–10.5)
WBC: 12 10*3/uL — ABNORMAL HIGH (ref 4.0–10.5)
nRBC: 0 % (ref 0.0–0.2)
nRBC: 0 % (ref 0.0–0.2)
nRBC: 0 % (ref 0.0–0.2)

## 2020-06-24 LAB — HEMOGLOBIN AND HEMATOCRIT, BLOOD
HCT: 34 % — ABNORMAL LOW (ref 39.0–52.0)
Hemoglobin: 11.3 g/dL — ABNORMAL LOW (ref 13.0–17.0)

## 2020-06-24 LAB — COMPREHENSIVE METABOLIC PANEL
ALT: 47 U/L — ABNORMAL HIGH (ref 0–44)
AST: 159 U/L — ABNORMAL HIGH (ref 15–41)
Albumin: 3 g/dL — ABNORMAL LOW (ref 3.5–5.0)
Alkaline Phosphatase: 35 U/L — ABNORMAL LOW (ref 38–126)
Anion gap: 7 (ref 5–15)
BUN: 10 mg/dL (ref 6–20)
CO2: 22 mmol/L (ref 22–32)
Calcium: 7.3 mg/dL — ABNORMAL LOW (ref 8.9–10.3)
Chloride: 108 mmol/L (ref 98–111)
Creatinine, Ser: 0.93 mg/dL (ref 0.61–1.24)
GFR calc Af Amer: >60 mL/min (ref >60)
GFR calc non Af Amer: >60 mL/min (ref >60)
Glucose, Bld: 114 mg/dL — ABNORMAL HIGH (ref 70–99)
Potassium: 3.9 mmol/L (ref 3.5–5.1)
Sodium: 137 mmol/L (ref 135–145)
Total Bilirubin: 0.8 mg/dL (ref 0.3–1.2)
Total Protein: 5 g/dL — ABNORMAL LOW (ref 6.5–8.1)

## 2020-06-24 LAB — POCT I-STAT, CHEM 8
BUN: 9 mg/dL (ref 6–20)
BUN: 8 mg/dL (ref 6–20)
BUN: 8 mg/dL (ref 6–20)
BUN: 9 mg/dL (ref 6–20)
BUN: 9 mg/dL (ref 6–20)
Calcium, Ion: 1.14 mmol/L — ABNORMAL LOW (ref 1.15–1.40)
Calcium, Ion: 1.16 mmol/L (ref 1.15–1.40)
Calcium, Ion: 0.96 mmol/L — ABNORMAL LOW (ref 1.15–1.40)
Calcium, Ion: 1.04 mmol/L — ABNORMAL LOW (ref 1.15–1.40)
Calcium, Ion: 1.07 mmol/L — ABNORMAL LOW (ref 1.15–1.40)
Chloride: 98 mmol/L (ref 98–111)
Chloride: 98 mmol/L (ref 98–111)
Chloride: 95 mmol/L — ABNORMAL LOW (ref 98–111)
Chloride: 98 mmol/L (ref 98–111)
Chloride: 101 mmol/L (ref 98–111)
Creatinine, Ser: 0.9 mg/dL (ref 0.61–1.24)
Creatinine, Ser: 0.9 mg/dL (ref 0.61–1.24)
Creatinine, Ser: 0.8 mg/dL (ref 0.61–1.24)
Creatinine, Ser: 1 mg/dL (ref 0.61–1.24)
Creatinine, Ser: 0.9 mg/dL (ref 0.61–1.24)
Glucose, Bld: 110 mg/dL — ABNORMAL HIGH (ref 70–99)
Glucose, Bld: 104 mg/dL — ABNORMAL HIGH (ref 70–99)
Glucose, Bld: 96 mg/dL (ref 70–99)
Glucose, Bld: 124 mg/dL — ABNORMAL HIGH (ref 70–99)
Glucose, Bld: 104 mg/dL — ABNORMAL HIGH (ref 70–99)
HCT: 41 % (ref 39.0–52.0)
HCT: 39 % (ref 39.0–52.0)
HCT: 34 % — ABNORMAL LOW (ref 39.0–52.0)
HCT: 33 % — ABNORMAL LOW (ref 39.0–52.0)
HCT: 33 % — ABNORMAL LOW (ref 39.0–52.0)
Hemoglobin: 13.9 g/dL (ref 13.0–17.0)
Hemoglobin: 13.3 g/dL (ref 13.0–17.0)
Hemoglobin: 11.6 g/dL — ABNORMAL LOW (ref 13.0–17.0)
Hemoglobin: 11.2 g/dL — ABNORMAL LOW (ref 13.0–17.0)
Hemoglobin: 11.2 g/dL — ABNORMAL LOW (ref 13.0–17.0)
Potassium: 3.1 mmol/L — ABNORMAL LOW (ref 3.5–5.1)
Potassium: 3.2 mmol/L — ABNORMAL LOW (ref 3.5–5.1)
Potassium: 3.5 mmol/L (ref 3.5–5.1)
Potassium: 4 mmol/L (ref 3.5–5.1)
Potassium: 3.8 mmol/L (ref 3.5–5.1)
Sodium: 138 mmol/L (ref 135–145)
Sodium: 137 mmol/L (ref 135–145)
Sodium: 138 mmol/L (ref 135–145)
Sodium: 135 mmol/L (ref 135–145)
Sodium: 138 mmol/L (ref 135–145)
TCO2: 26 mmol/L (ref 22–32)
TCO2: 26 mmol/L (ref 22–32)
TCO2: 26 mmol/L (ref 22–32)
TCO2: 26 mmol/L (ref 22–32)
TCO2: 24 mmol/L (ref 22–32)

## 2020-06-24 LAB — PROTIME-INR
INR: 1.3 — ABNORMAL HIGH (ref 0.8–1.2)
Prothrombin Time: 15.9 seconds — ABNORMAL HIGH (ref 11.4–15.2)

## 2020-06-24 LAB — HEPARIN LEVEL (UNFRACTIONATED): Heparin Unfractionated: 0.19 IU/mL — ABNORMAL LOW (ref 0.30–0.70)

## 2020-06-24 LAB — GLUCOSE, CAPILLARY
Glucose-Capillary: 103 mg/dL — ABNORMAL HIGH (ref 70–99)
Glucose-Capillary: 108 mg/dL — ABNORMAL HIGH (ref 70–99)
Glucose-Capillary: 117 mg/dL — ABNORMAL HIGH (ref 70–99)
Glucose-Capillary: 119 mg/dL — ABNORMAL HIGH (ref 70–99)
Glucose-Capillary: 121 mg/dL — ABNORMAL HIGH (ref 70–99)
Glucose-Capillary: 122 mg/dL — ABNORMAL HIGH (ref 70–99)
Glucose-Capillary: 126 mg/dL — ABNORMAL HIGH (ref 70–99)
Glucose-Capillary: 132 mg/dL — ABNORMAL HIGH (ref 70–99)

## 2020-06-24 LAB — HIV ANTIBODY (ROUTINE TESTING W REFLEX): HIV Screen 4th Generation wRfx: NONREACTIVE

## 2020-06-24 LAB — HEMOGLOBIN A1C
Hgb A1c MFr Bld: 5.2 % (ref 4.8–5.6)
Mean Plasma Glucose: 102.54 mg/dL

## 2020-06-24 LAB — MAGNESIUM: Magnesium: 2.6 mg/dL — ABNORMAL HIGH (ref 1.7–2.4)

## 2020-06-24 LAB — PHOSPHORUS: Phosphorus: 2.4 mg/dL — ABNORMAL LOW (ref 2.5–4.6)

## 2020-06-24 LAB — LIPID PANEL
Cholesterol: 199 mg/dL (ref 0–200)
HDL: 40 mg/dL — ABNORMAL LOW (ref >40)
LDL Cholesterol: 123 mg/dL — ABNORMAL HIGH (ref 0–99)
Total CHOL/HDL Ratio: 5 RATIO
Triglycerides: 179 mg/dL — ABNORMAL HIGH (ref <150)
VLDL: 36 mg/dL (ref 0–40)

## 2020-06-24 LAB — APTT: aPTT: 30 seconds (ref 24–36)

## 2020-06-24 LAB — PLATELET COUNT: Platelets: 215 10*3/uL (ref 150–400)

## 2020-06-24 SURGERY — CORONARY ARTERY BYPASS GRAFTING (CABG)
Anesthesia: General | Site: Chest | Laterality: Right

## 2020-06-24 MED ORDER — ACETAMINOPHEN 650 MG RE SUPP
650.0000 mg | Freq: Once | RECTAL | Status: AC
  Administered 2020-06-24: 650 mg via RECTAL

## 2020-06-24 MED ORDER — STERILE WATER FOR INJECTION IJ SOLN
INTRAMUSCULAR | Status: DC | PRN
  Administered 2020-06-24: 10 mL via INTRAMUSCULAR

## 2020-06-24 MED ORDER — ALBUMIN HUMAN 5 % IV SOLN: INTRAVENOUS | Status: DC | PRN

## 2020-06-24 MED ORDER — 0.9 % SODIUM CHLORIDE (POUR BTL) OPTIME
TOPICAL | Status: DC | PRN
  Administered 2020-06-24: 5000 mL

## 2020-06-24 MED ORDER — MAGNESIUM SULFATE 4 GM/100ML IV SOLN
4.0000 g | Freq: Once | INTRAVENOUS | Status: AC
  Administered 2020-06-24: 4 g via INTRAVENOUS

## 2020-06-24 MED ORDER — VANCOMYCIN HCL IN DEXTROSE 1-5 GM/200ML-% IV SOLN
1000.0000 mg | Freq: Once | INTRAVENOUS | Status: AC
  Administered 2020-06-24: 1000 mg via INTRAVENOUS
  Filled 2020-06-24: qty 200

## 2020-06-24 MED ORDER — FENTANYL CITRATE (PF) 250 MCG/5ML IJ SOLN
INTRAMUSCULAR | Status: AC
  Filled 2020-06-24: qty 5

## 2020-06-24 MED ORDER — PROTAMINE SULFATE 10 MG/ML IV SOLN
INTRAVENOUS | Status: DC | PRN
  Administered 2020-06-24: 370 mg via INTRAVENOUS

## 2020-06-24 MED ORDER — ALBUTEROL SULFATE HFA 108 (90 BASE) MCG/ACT IN AERS
INHALATION_SPRAY | RESPIRATORY_TRACT | Status: DC | PRN
  Administered 2020-06-24 (×2): 3 via RESPIRATORY_TRACT

## 2020-06-24 MED ORDER — METOPROLOL TARTRATE 25 MG/10 ML ORAL SUSPENSION
12.5000 mg | Freq: Two times a day (BID) | ORAL | Status: DC
  Administered 2020-06-26: 12.5 mg
  Filled 2020-06-24: qty 5

## 2020-06-24 MED ORDER — LACTATED RINGERS IV SOLN: INTRAVENOUS | Status: DC | PRN

## 2020-06-24 MED ORDER — HEPARIN SODIUM (PORCINE) 1000 UNIT/ML IJ SOLN
INTRAMUSCULAR | Status: AC
  Filled 2020-06-24: qty 1

## 2020-06-24 MED ORDER — STERILE WATER FOR IRRIGATION IR SOLN
Status: AC | PRN
  Administered 2020-06-24: 2000 mL/h

## 2020-06-24 MED ORDER — ACETAMINOPHEN 160 MG/5ML PO SOLN: 650.0000 mg | Freq: Once | ORAL | Status: AC

## 2020-06-24 MED ORDER — ALBUTEROL SULFATE HFA 108 (90 BASE) MCG/ACT IN AERS
INHALATION_SPRAY | RESPIRATORY_TRACT | Status: AC
  Filled 2020-06-24: qty 6.7

## 2020-06-24 MED ORDER — POVIDONE-IODINE 7.5 % EX SOLN
CUTANEOUS | Status: DC | PRN
  Administered 2020-06-24: 1 via TOPICAL

## 2020-06-24 MED ORDER — ACETAMINOPHEN 500 MG PO TABS
1000.0000 mg | ORAL_TABLET | Freq: Four times a day (QID) | ORAL | Status: DC
  Administered 2020-06-25 – 2020-06-29 (×12): 1000 mg via ORAL
  Filled 2020-06-24 (×14): qty 2

## 2020-06-24 MED ORDER — METOPROLOL TARTRATE 5 MG/5ML IV SOLN: 2.5000 mg | INTRAVENOUS | Status: DC | PRN

## 2020-06-24 MED ORDER — SODIUM CHLORIDE 0.9 % IV SOLN: 250.0000 mL | INTRAVENOUS | Status: DC

## 2020-06-24 MED ORDER — VANCOMYCIN HCL 1000 MG IV SOLR
INTRAVENOUS | Status: AC
  Filled 2020-06-24: qty 3000

## 2020-06-24 MED ORDER — HEPARIN SODIUM (PORCINE) 1000 UNIT/ML IJ SOLN
INTRAMUSCULAR | Status: DC | PRN
  Administered 2020-06-24: 37000 [IU] via INTRAVENOUS

## 2020-06-24 MED ORDER — SODIUM CHLORIDE 0.9% FLUSH
3.0000 mL | Freq: Two times a day (BID) | INTRAVENOUS | Status: DC
  Administered 2020-06-25 – 2020-06-28 (×7): 3 mL via INTRAVENOUS

## 2020-06-24 MED ORDER — CHLORHEXIDINE GLUCONATE 0.12% ORAL RINSE (MEDLINE KIT)
15.0000 mL | Freq: Two times a day (BID) | OROMUCOSAL | Status: DC
  Administered 2020-06-24 – 2020-06-28 (×5): 15 mL via OROMUCOSAL

## 2020-06-24 MED ORDER — INSULIN REGULAR(HUMAN) IN NACL 100-0.9 UT/100ML-% IV SOLN: INTRAVENOUS | Status: DC

## 2020-06-24 MED ORDER — STERILE WATER FOR INJECTION IV SOLN
INTRAVENOUS | Status: DC | PRN
  Administered 2020-06-24: 30 mL

## 2020-06-24 MED ORDER — LACTATED RINGERS IV SOLN: 500.0000 mL | Freq: Once | INTRAVENOUS | Status: DC | PRN

## 2020-06-24 MED ORDER — BUPIVACAINE LIPOSOME 1.3 % IJ SUSP
INTRAMUSCULAR | Status: DC | PRN
  Administered 2020-06-24: 50 mL

## 2020-06-24 MED ORDER — BUPIVACAINE LIPOSOME 1.3 % IJ SUSP
20.0000 mL | Freq: Once | INTRAMUSCULAR | Status: DC
  Filled 2020-06-24: qty 20

## 2020-06-24 MED ORDER — SODIUM CHLORIDE 0.45 % IV SOLN: INTRAVENOUS | Status: DC | PRN

## 2020-06-24 MED ORDER — TRAMADOL HCL 50 MG PO TABS
50.0000 mg | ORAL_TABLET | ORAL | Status: DC | PRN
  Administered 2020-06-26: 50 mg via ORAL
  Filled 2020-06-24: qty 1

## 2020-06-24 MED ORDER — ACETAMINOPHEN 160 MG/5ML PO SOLN
1000.0000 mg | Freq: Four times a day (QID) | ORAL | Status: DC
  Administered 2020-06-25 (×2): 1000 mg
  Filled 2020-06-24: qty 40.6

## 2020-06-24 MED ORDER — SODIUM CHLORIDE 0.9 % IV SOLN: INTRAVENOUS | Status: DC

## 2020-06-24 MED ORDER — ALBUMIN HUMAN 5 % IV SOLN
250.0000 mL | INTRAVENOUS | Status: AC | PRN
  Administered 2020-06-24 (×2): 12.5 g via INTRAVENOUS

## 2020-06-24 MED ORDER — POTASSIUM CHLORIDE 10 MEQ/50ML IV SOLN: 10.0000 meq | INTRAVENOUS | Status: AC

## 2020-06-24 MED ORDER — PROPOFOL 10 MG/ML IV BOLUS
INTRAVENOUS | Status: AC
  Filled 2020-06-24: qty 20

## 2020-06-24 MED ORDER — FENTANYL CITRATE (PF) 250 MCG/5ML IJ SOLN
INTRAMUSCULAR | Status: AC
  Filled 2020-06-24: qty 20

## 2020-06-24 MED ORDER — ROCURONIUM BROMIDE 10 MG/ML (PF) SYRINGE
PREFILLED_SYRINGE | INTRAVENOUS | Status: AC
  Filled 2020-06-24: qty 20

## 2020-06-24 MED ORDER — SODIUM CHLORIDE 0.9% FLUSH: 3.0000 mL | INTRAVENOUS | Status: DC | PRN

## 2020-06-24 MED ORDER — METOPROLOL TARTRATE 12.5 MG HALF TABLET
12.5000 mg | ORAL_TABLET | Freq: Two times a day (BID) | ORAL | Status: DC
  Filled 2020-06-24: qty 1

## 2020-06-24 MED ORDER — CHLORHEXIDINE GLUCONATE 0.12 % MT SOLN
15.0000 mL | OROMUCOSAL | Status: AC
  Administered 2020-06-24: 15 mL via OROMUCOSAL

## 2020-06-24 MED ORDER — ASPIRIN EC 325 MG PO TBEC
325.0000 mg | DELAYED_RELEASE_TABLET | Freq: Every day | ORAL | Status: DC
  Administered 2020-06-26: 325 mg via ORAL
  Filled 2020-06-24: qty 1

## 2020-06-24 MED ORDER — INSULIN ASPART 100 UNIT/ML ~~LOC~~ SOLN
0.0000 [IU] | SUBCUTANEOUS | Status: DC
  Administered 2020-06-24 – 2020-06-26 (×2): 2 [IU] via SUBCUTANEOUS
  Administered 2020-06-26: 4 [IU] via SUBCUTANEOUS
  Administered 2020-06-27 (×4): 2 [IU] via SUBCUTANEOUS

## 2020-06-24 MED ORDER — LACTATED RINGERS IV SOLN: INTRAVENOUS | Status: DC

## 2020-06-24 MED ORDER — PHENYLEPHRINE 40 MCG/ML (10ML) SYRINGE FOR IV PUSH (FOR BLOOD PRESSURE SUPPORT)
PREFILLED_SYRINGE | INTRAVENOUS | Status: AC
  Filled 2020-06-24: qty 20

## 2020-06-24 MED ORDER — MIDAZOLAM HCL (PF) 10 MG/2ML IJ SOLN
INTRAMUSCULAR | Status: AC
  Filled 2020-06-24: qty 2

## 2020-06-24 MED ORDER — EPHEDRINE SULFATE 50 MG/ML IJ SOLN
INTRAMUSCULAR | Status: DC | PRN
  Administered 2020-06-24 (×3): 5 mg via INTRAVENOUS

## 2020-06-24 MED ORDER — PHENYLEPHRINE HCL-NACL 20-0.9 MG/250ML-% IV SOLN
0.0000 ug/min | INTRAVENOUS | Status: DC
  Administered 2020-06-26: 45 ug/min via INTRAVENOUS
  Administered 2020-06-26: 5.067 ug/min via INTRAVENOUS
  Filled 2020-06-24 (×3): qty 250

## 2020-06-24 MED ORDER — BISACODYL 5 MG PO TBEC
10.0000 mg | DELAYED_RELEASE_TABLET | Freq: Every day | ORAL | Status: DC
  Administered 2020-06-26: 10 mg via ORAL
  Filled 2020-06-24: qty 2

## 2020-06-24 MED ORDER — DEXTROSE 50 % IV SOLN: 0.0000 mL | INTRAVENOUS | Status: DC | PRN

## 2020-06-24 MED ORDER — DEXMEDETOMIDINE HCL IN NACL 400 MCG/100ML IV SOLN
0.0000 ug/kg/h | INTRAVENOUS | Status: DC
  Administered 2020-06-24 (×2): 1.2 ug/kg/h via INTRAVENOUS
  Administered 2020-06-24: 0.7 ug/kg/h via INTRAVENOUS
  Administered 2020-06-25: 1 ug/kg/h via INTRAVENOUS
  Administered 2020-06-25: 1.2 ug/kg/h via INTRAVENOUS
  Filled 2020-06-24 (×5): qty 100

## 2020-06-24 MED ORDER — ROCURONIUM BROMIDE 100 MG/10ML IV SOLN
INTRAVENOUS | Status: DC | PRN
  Administered 2020-06-24: 20 mg via INTRAVENOUS
  Administered 2020-06-24: 100 mg via INTRAVENOUS
  Administered 2020-06-24 (×3): 50 mg via INTRAVENOUS

## 2020-06-24 MED ORDER — NITROGLYCERIN IN D5W 200-5 MCG/ML-% IV SOLN: 7.0000 ug/min | INTRAVENOUS | Status: DC

## 2020-06-24 MED ORDER — VANCOMYCIN HCL 1000 MG IV SOLR
INTRAVENOUS | Status: DC | PRN
  Administered 2020-06-24: 3 g via TOPICAL

## 2020-06-24 MED ORDER — MIDAZOLAM HCL 2 MG/2ML IJ SOLN
INTRAMUSCULAR | Status: AC
  Filled 2020-06-24: qty 2

## 2020-06-24 MED ORDER — PANTOPRAZOLE SODIUM 40 MG PO TBEC
40.0000 mg | DELAYED_RELEASE_TABLET | Freq: Every day | ORAL | Status: DC
  Administered 2020-06-26 – 2020-06-29 (×4): 40 mg via ORAL
  Filled 2020-06-24 (×4): qty 1

## 2020-06-24 MED ORDER — OXYCODONE HCL 5 MG PO TABS
5.0000 mg | ORAL_TABLET | ORAL | Status: DC | PRN
  Administered 2020-06-25 – 2020-06-28 (×9): 10 mg via ORAL
  Filled 2020-06-24 (×9): qty 2

## 2020-06-24 MED ORDER — STERILE WATER FOR INJECTION IJ SOLN
INTRAMUSCULAR | Status: AC
  Filled 2020-06-24: qty 10

## 2020-06-24 MED ORDER — BUPIVACAINE HCL (PF) 0.5 % IJ SOLN
INTRAMUSCULAR | Status: AC
  Filled 2020-06-24: qty 30

## 2020-06-24 MED ORDER — MORPHINE SULFATE (PF) 2 MG/ML IV SOLN
1.0000 mg | INTRAVENOUS | Status: DC | PRN
  Administered 2020-06-24: 2 mg via INTRAVENOUS
  Administered 2020-06-24 – 2020-06-25 (×4): 4 mg via INTRAVENOUS
  Administered 2020-06-25 – 2020-06-26 (×2): 1 mg via INTRAVENOUS
  Administered 2020-06-26 (×3): 2 mg via INTRAVENOUS
  Filled 2020-06-24 (×2): qty 1
  Filled 2020-06-24 (×2): qty 2
  Filled 2020-06-24 (×2): qty 1
  Filled 2020-06-24: qty 2
  Filled 2020-06-24 (×2): qty 1
  Filled 2020-06-24 (×2): qty 2

## 2020-06-24 MED ORDER — SODIUM CHLORIDE (PF) 0.9 % IJ SOLN
OROMUCOSAL | Status: DC | PRN
  Administered 2020-06-24 (×2): 4 mL via TOPICAL

## 2020-06-24 MED ORDER — PROPOFOL 10 MG/ML IV BOLUS
INTRAVENOUS | Status: DC | PRN
  Administered 2020-06-24: 20 mg via INTRAVENOUS
  Administered 2020-06-24: 10 mg via INTRAVENOUS
  Administered 2020-06-24: 30 mg via INTRAVENOUS

## 2020-06-24 MED ORDER — HEMOSTATIC AGENTS (NO CHARGE) OPTIME
TOPICAL | Status: DC | PRN
  Administered 2020-06-24 (×2): 1 via TOPICAL

## 2020-06-24 MED ORDER — ONDANSETRON HCL 4 MG/2ML IJ SOLN
4.0000 mg | Freq: Four times a day (QID) | INTRAMUSCULAR | Status: DC | PRN
  Administered 2020-06-26: 4 mg via INTRAVENOUS
  Filled 2020-06-24: qty 2

## 2020-06-24 MED ORDER — DOCUSATE SODIUM 100 MG PO CAPS
200.0000 mg | ORAL_CAPSULE | Freq: Every day | ORAL | Status: DC
  Administered 2020-06-29: 200 mg via ORAL
  Filled 2020-06-24 (×2): qty 2

## 2020-06-24 MED ORDER — FAMOTIDINE IN NACL 20-0.9 MG/50ML-% IV SOLN
20.0000 mg | Freq: Two times a day (BID) | INTRAVENOUS | Status: AC
  Administered 2020-06-24 (×2): 20 mg via INTRAVENOUS
  Filled 2020-06-24: qty 50

## 2020-06-24 MED ORDER — MIDAZOLAM HCL 5 MG/5ML IJ SOLN
INTRAMUSCULAR | Status: DC | PRN
  Administered 2020-06-24 (×3): 1 mg via INTRAVENOUS
  Administered 2020-06-24: 2 mg via INTRAVENOUS
  Administered 2020-06-24 (×2): 1 mg via INTRAVENOUS
  Administered 2020-06-24 (×2): 2 mg via INTRAVENOUS

## 2020-06-24 MED ORDER — MIDAZOLAM HCL 2 MG/2ML IJ SOLN
2.0000 mg | INTRAMUSCULAR | Status: DC | PRN
  Administered 2020-06-24 – 2020-06-25 (×9): 2 mg via INTRAVENOUS
  Filled 2020-06-24 (×10): qty 2

## 2020-06-24 MED ORDER — PHENYLEPHRINE HCL-NACL 10-0.9 MG/250ML-% IV SOLN
INTRAVENOUS | Status: DC | PRN
  Administered 2020-06-24: 25 ug/min via INTRAVENOUS

## 2020-06-24 MED ORDER — POVIDONE-IODINE 10 % EX SOLN
CUTANEOUS | Status: DC | PRN
  Administered 2020-06-24: 1 via TOPICAL

## 2020-06-24 MED ORDER — FENTANYL CITRATE (PF) 250 MCG/5ML IJ SOLN
INTRAMUSCULAR | Status: DC | PRN
Start: 2020-06-24 — End: 2020-06-24
  Administered 2020-06-24: 250 ug via INTRAVENOUS
  Administered 2020-06-24: 200 ug via INTRAVENOUS
  Administered 2020-06-24: 50 ug via INTRAVENOUS
  Administered 2020-06-24: 250 ug via INTRAVENOUS
  Administered 2020-06-24: 100 ug via INTRAVENOUS
  Administered 2020-06-24: 250 ug via INTRAVENOUS
  Administered 2020-06-24: 100 ug via INTRAVENOUS
  Administered 2020-06-24: 50 ug via INTRAVENOUS

## 2020-06-24 MED ORDER — SODIUM CHLORIDE 0.9% FLUSH: 10.0000 mL | INTRAVENOUS | Status: DC | PRN

## 2020-06-24 MED ORDER — PHENYLEPHRINE HCL (PRESSORS) 10 MG/ML IV SOLN
INTRAVENOUS | Status: DC | PRN
  Administered 2020-06-24 (×2): 80 ug via INTRAVENOUS

## 2020-06-24 MED ORDER — ISOSORBIDE DINITRATE 10 MG PO TABS
10.0000 mg | ORAL_TABLET | Freq: Three times a day (TID) | ORAL | Status: DC
  Administered 2020-06-25 – 2020-06-29 (×13): 10 mg via ORAL
  Filled 2020-06-24 (×13): qty 1

## 2020-06-24 MED ORDER — ASPIRIN 81 MG PO CHEW
324.0000 mg | CHEWABLE_TABLET | Freq: Every day | ORAL | Status: DC
  Administered 2020-06-25: 324 mg
  Filled 2020-06-24: qty 4

## 2020-06-24 MED ORDER — BISACODYL 10 MG RE SUPP: 10.0000 mg | Freq: Every day | RECTAL | Status: DC

## 2020-06-24 MED ORDER — LEVOFLOXACIN IN D5W 750 MG/150ML IV SOLN
750.0000 mg | INTRAVENOUS | Status: AC
  Administered 2020-06-25: 750 mg via INTRAVENOUS
  Filled 2020-06-24: qty 150

## 2020-06-24 MED ORDER — ORAL CARE MOUTH RINSE
15.0000 mL | OROMUCOSAL | Status: DC
  Administered 2020-06-24 – 2020-06-25 (×8): 15 mL via OROMUCOSAL

## 2020-06-24 MED ORDER — LIDOCAINE 2% (20 MG/ML) 5 ML SYRINGE
INTRAMUSCULAR | Status: AC
  Filled 2020-06-24: qty 5

## 2020-06-24 MED FILL — Nitroglycerin IV Soln 200 MCG/ML in D5W: INTRAVENOUS | Qty: 250 | Status: AC

## 2020-06-24 SURGICAL SUPPLY — 136 items
ADAPTER CARDIO PERF ANTE/RETRO (ADAPTER) ×5 IMPLANT
ADH SKN CLS APL DERMABOND .7 (GAUZE/BANDAGES/DRESSINGS) ×8
ADPR PRFSN 84XANTGRD RTRGD (ADAPTER) ×4
ADPR TBG 2 MALE LL ART (MISCELLANEOUS) ×4
ANCHOR CATH FOLEY SECURE (MISCELLANEOUS) ×12 IMPLANT
APL SRG 7X2 LUM MLBL SLNT (VASCULAR PRODUCTS)
APPLICATOR TIP COSEAL (VASCULAR PRODUCTS) IMPLANT
APPLIER CLIP 9.375 SM OPEN (CLIP)
APR CLP SM 9.3 20 MLT OPN (CLIP)
BAG DECANTER FOR FLEXI CONT (MISCELLANEOUS) ×5 IMPLANT
BLADE CLIPPER SURG (BLADE) ×7 IMPLANT
BLADE STERNUM SYSTEM 6 (BLADE) ×5 IMPLANT
BLADE SURG 10 STRL SS (BLADE) ×1 IMPLANT
BLADE SURG 15 STRL LF DISP TIS (BLADE) ×4 IMPLANT
BLADE SURG 15 STRL SS (BLADE) ×5
BLADE SURG SZ11 CARB STEEL (BLADE) ×1 IMPLANT
BNDG ELASTIC 4X5.8 VLCR STR LF (GAUZE/BANDAGES/DRESSINGS) ×6 IMPLANT
BNDG ELASTIC 6X5.8 VLCR STR LF (GAUZE/BANDAGES/DRESSINGS) ×5 IMPLANT
BNDG GAUZE ELAST 4 BULKY (GAUZE/BANDAGES/DRESSINGS) ×6 IMPLANT
CANISTER SUCT 3000ML PPV (MISCELLANEOUS) ×5 IMPLANT
CANNULA GUNDRY RCSP 15FR (MISCELLANEOUS) ×1 IMPLANT
CANNULA NON VENT 22FR 12 (CANNULA) ×1 IMPLANT
CATH CPB KIT HENDRICKSON (MISCELLANEOUS) ×5 IMPLANT
CATH DIAG EXPO 6F AL1 (CATHETERS) ×4 IMPLANT
CATH INFINITI 6F MPB2 (CATHETERS) ×4 IMPLANT
CATH RETROPLEGIA CORONARY 14FR (CATHETERS) ×1 IMPLANT
CATH ROBINSON RED A/P 18FR (CATHETERS) ×12 IMPLANT
CLIP APPLIE 9.375 SM OPEN (CLIP) ×4 IMPLANT
CLIP LIGATING EXTRA MED SLVR (CLIP) ×4 IMPLANT
CLIP RETRACTION 3.0MM CORONARY (MISCELLANEOUS) ×5 IMPLANT
CLIP VESOCCLUDE MED 24/CT (CLIP) IMPLANT
CLIP VESOCCLUDE SM WIDE 24/CT (CLIP) ×1 IMPLANT
COVER MAYO STAND STRL (DRAPES) ×5 IMPLANT
CUFF TOURN SGL QUICK 18X4 (TOURNIQUET CUFF) IMPLANT
CUFF TOURN SGL QUICK 24 (TOURNIQUET CUFF)
CUFF TRNQT CYL 24X4X16.5-23 (TOURNIQUET CUFF) IMPLANT
DERMABOND ADVANCED (GAUZE/BANDAGES/DRESSINGS) ×2
DERMABOND ADVANCED .7 DNX12 (GAUZE/BANDAGES/DRESSINGS) ×4 IMPLANT
DRAIN CHANNEL 28F RND 3/8 FF (WOUND CARE) ×15 IMPLANT
DRAPE C-ARM 42X72 X-RAY (DRAPES) ×8 IMPLANT
DRAPE CARDIOVASCULAR INCISE (DRAPES) ×5
DRAPE CV SPLIT W-CLR ANES SCRN (DRAPES) ×5 IMPLANT
DRAPE EXTREMITY T 121X128X90 (DISPOSABLE) ×5 IMPLANT
DRAPE HALF SHEET 40X57 (DRAPES) ×5 IMPLANT
DRAPE PERI GROIN 82X75IN TIB (DRAPES) ×5 IMPLANT
DRAPE SLUSH/WARMER DISC (DRAPES) ×5 IMPLANT
DRAPE SRG 135X102X78XABS (DRAPES) ×4 IMPLANT
DRSG AQUACEL AG ADV 3.5X14 (GAUZE/BANDAGES/DRESSINGS) ×5 IMPLANT
ELECT BLADE 4.0 EZ CLEAN MEGAD (MISCELLANEOUS) ×10
ELECT CAUTERY BLADE 6.4 (BLADE) ×5 IMPLANT
ELECT REM PT RETURN 9FT ADLT (ELECTROSURGICAL) ×10
ELECTRODE BLDE 4.0 EZ CLN MEGD (MISCELLANEOUS) IMPLANT
ELECTRODE REM PT RTRN 9FT ADLT (ELECTROSURGICAL) ×8 IMPLANT
FELT TEFLON 1X6 (MISCELLANEOUS) ×9 IMPLANT
GAUZE SPONGE 4X4 12PLY STRL (GAUZE/BANDAGES/DRESSINGS) ×11 IMPLANT
GEL ULTRASOUND 20GR AQUASONIC (MISCELLANEOUS) ×4 IMPLANT
GLOVE BIO SURGEON STRL SZ 6.5 (GLOVE) ×6 IMPLANT
GLOVE BIOGEL PI IND STRL 6.5 (GLOVE) IMPLANT
GLOVE BIOGEL PI INDICATOR 6.5 (GLOVE) ×6
GLOVE NEODERM STRL 7.5  LF PF (GLOVE) ×12
GLOVE NEODERM STRL 7.5 LF PF (GLOVE) ×12 IMPLANT
GLOVE SURG NEODERM 7.5  LF PF (GLOVE) ×3
GOWN STRL REUS W/ TWL LRG LVL3 (GOWN DISPOSABLE) ×16 IMPLANT
GOWN STRL REUS W/TWL LRG LVL3 (GOWN DISPOSABLE) ×40
HEMOSTAT POWDER SURGIFOAM 1G (HEMOSTASIS) ×10 IMPLANT
INSERT FOGARTY SM (MISCELLANEOUS) ×4 IMPLANT
INSERT FOGARTY XLG (MISCELLANEOUS) ×1 IMPLANT
INSERT SUTURE HOLDER (MISCELLANEOUS) ×5 IMPLANT
IV ADAPTER SYR DOUBLE MALE LL (MISCELLANEOUS) ×1 IMPLANT
KIT BASIN OR (CUSTOM PROCEDURE TRAY) ×5 IMPLANT
KIT SUCTION CATH 14FR (SUCTIONS) ×6 IMPLANT
KIT TURNOVER KIT B (KITS) ×5 IMPLANT
KIT VASOVIEW HEMOPRO 2 VH 4000 (KITS) ×5 IMPLANT
LIGACLIP SM TITANIUM (CLIP) ×4 IMPLANT
LOOP VESSEL MINI RED (MISCELLANEOUS) ×4 IMPLANT
MARKER GRAFT CORONARY BYPASS (MISCELLANEOUS) ×12 IMPLANT
NDL 18GX1X1/2 (RX/OR ONLY) (NEEDLE) ×4 IMPLANT
NEEDLE 18GX1X1/2 (RX/OR ONLY) (NEEDLE) ×5 IMPLANT
NS IRRIG 1000ML POUR BTL (IV SOLUTION) ×25 IMPLANT
PACK CHEST (CUSTOM PROCEDURE TRAY) ×4 IMPLANT
PACK E OPEN HEART (SUTURE) ×5 IMPLANT
PACK OPEN HEART (CUSTOM PROCEDURE TRAY) ×5 IMPLANT
PACK SPY-PHI (KITS) ×1 IMPLANT
PAD ARMBOARD 7.5X6 YLW CONV (MISCELLANEOUS) ×10 IMPLANT
PAD ELECT DEFIB RADIOL ZOLL (MISCELLANEOUS) ×5 IMPLANT
PENCIL BUTTON HOLSTER BLD 10FT (ELECTRODE) ×6 IMPLANT
POSITIONER HEAD DONUT 9IN (MISCELLANEOUS) ×5 IMPLANT
POWDER SURGICEL 3.0 GRAM (HEMOSTASIS) ×1 IMPLANT
PUMP SET IMPELLA 5.5 US (CATHETERS) ×4 IMPLANT
PUNCH AORTIC ROTATE 4.5MM 8IN (MISCELLANEOUS) ×1 IMPLANT
SEALANT SURG COSEAL 4ML (VASCULAR PRODUCTS) ×1 IMPLANT
SEALANT SURG COSEAL 8ML (VASCULAR PRODUCTS) ×4 IMPLANT
SET CARDIOPLEGIA MPS 5001102 (MISCELLANEOUS) ×1 IMPLANT
SHEARS HARMONIC 9CM CVD (BLADE) ×1 IMPLANT
SPONGE LAP 18X18 RF (DISPOSABLE) ×1 IMPLANT
STAPLER VISISTAT 35W (STAPLE) ×1 IMPLANT
SUPPORT HEART JANKE-BARRON (MISCELLANEOUS) ×5 IMPLANT
SUT BONE WAX W31G (SUTURE) ×5 IMPLANT
SUT ETHILON 3 0 PS 1 (SUTURE) ×10 IMPLANT
SUT MNCRL AB 3-0 PS2 18 (SUTURE) ×10 IMPLANT
SUT MNCRL AB 4-0 PS2 18 (SUTURE) ×3 IMPLANT
SUT PDS AB 1 CTX 36 (SUTURE) ×10 IMPLANT
SUT PROLENE 3 0 SH DA (SUTURE) ×6 IMPLANT
SUT PROLENE 5 0 C 1 36 (SUTURE) ×10 IMPLANT
SUT PROLENE 6 0 C 1 30 (SUTURE) ×18 IMPLANT
SUT PROLENE 7 0 BV1 MDA (SUTURE) ×1 IMPLANT
SUT PROLENE 8 0 BV175 6 (SUTURE) ×1 IMPLANT
SUT PROLENE BLUE 7 0 (SUTURE) ×5 IMPLANT
SUT SILK  1 MH (SUTURE) ×20
SUT SILK 1 MH (SUTURE) ×16 IMPLANT
SUT SILK 1 TIES 10X30 (SUTURE) ×5 IMPLANT
SUT SILK 2 0 SH CR/8 (SUTURE) IMPLANT
SUT SILK 3 0 SH CR/8 (SUTURE) IMPLANT
SUT STEEL 6MS V (SUTURE) ×5 IMPLANT
SUT STEEL SZ 6 DBL 3X14 BALL (SUTURE) ×5 IMPLANT
SUT VIC AB 2-0 CT1 27 (SUTURE) ×10
SUT VIC AB 2-0 CT1 TAPERPNT 27 (SUTURE) IMPLANT
SUT VIC AB 2-0 CTX 27 (SUTURE) IMPLANT
SUT VIC AB 3-0 SH 27 (SUTURE)
SUT VIC AB 3-0 SH 27X BRD (SUTURE) IMPLANT
SUT VIC AB 3-0 X1 27 (SUTURE) IMPLANT
SYR 10ML LL (SYRINGE) IMPLANT
SYR 30ML LL (SYRINGE) ×5 IMPLANT
SYR 3ML LL SCALE MARK (SYRINGE) ×5 IMPLANT
SYR 50ML SLIP (SYRINGE) IMPLANT
SYSTEM SAHARA CHEST DRAIN ATS (WOUND CARE) ×5 IMPLANT
TAPE CLOTH SURG 4X10 WHT LF (GAUZE/BANDAGES/DRESSINGS) ×1 IMPLANT
TAPE PAPER 2X10 WHT MICROPORE (GAUZE/BANDAGES/DRESSINGS) ×1 IMPLANT
TOWEL GREEN STERILE (TOWEL DISPOSABLE) ×5 IMPLANT
TOWEL GREEN STERILE FF (TOWEL DISPOSABLE) ×5 IMPLANT
TRAY FOLEY SLVR 16FR TEMP STAT (SET/KITS/TRAYS/PACK) ×5 IMPLANT
TUBING ART PRESS 48 MALE/FEM (TUBING) ×2 IMPLANT
TUBING LAP HI FLOW INSUFFLATIO (TUBING) ×5 IMPLANT
UNDERPAD 30X36 HEAVY ABSORB (UNDERPADS AND DIAPERS) ×5 IMPLANT
WATER STERILE IRR 1000ML POUR (IV SOLUTION) ×10 IMPLANT
WATER STERILE IRR 1000ML UROMA (IV SOLUTION) IMPLANT

## 2020-06-24 NOTE — Progress Notes (Addendum)
EVENING ROUNDS NOTE :     [address]       [place] [address]             [phone]                 Day of Surgery Procedure(s) (LRB): CORONARY ARTERY BYPASS GRAFTING (CABG) x 6, bilateral IMA, LIMA TO LAD, RIMA TO PDA, LEFT RADIAL ARTERY TO DIAG. 1 SEQ TO OM1, SVG TO OM2, SVG TO ACUTE MARGINAL (N/A) RADIAL ARTERY HARVEST (Left) possible, PLACEMENT OF IMPELLA LEFT VENTRICULAR ASSIST DEVICE 5.5 (N/A) TRANSESOPHAGEAL ECHOCARDIOGRAM (TEE) (N/A) INDOCYANINE GREEN FLUORESCENCE IMAGING (ICG) (N/A) ENDOVEIN HARVEST OF GREATER SAPHENOUS VEIN (Right)   Total Length of Stay:  LOS: 1 day  Events:   No events Minimal chest tube output No inotropes      BP (!) 124/56   Pulse 86   Temp 97.7 F (36.5 C)   Resp 19   Ht 5\' 9"  (1.753 m)   Wt 104.3 kg   SpO2 94%   BMI 33.97 kg/m   PAP: (23-33)/(14-19) 23/14 CO:  [3.7 L/min-5 L/min] 4.1 L/min CI:  [1.7 L/min/m2-2.3 L/min/m2] 1.9 L/min/m2  Vent Mode: SIMV;PSV;PRVC FiO2 (%):  [50 %] 50 % Set Rate:  [12 bmp] 12 bmp Vt Set:  [560 mL] 560 mL PEEP:  [5 cmH20] 5 cmH20 Pressure Support:  [10 cmH20] 10 cmH20 Plateau Pressure:  [13 cmH20] 13 cmH20  . sodium chloride Stopped (06/24/20 1419)  . [START ON 06/25/2020] sodium chloride    . sodium chloride 10 mL/hr at 06/24/20 1450  . albumin human 12.5 g (06/24/20 1509)  . amiodarone 30 mg/hr (06/24/20 1507)  . dexmedetomidine (PRECEDEX) IV infusion 0.7 mcg/kg/hr (06/24/20 1500)  . famotidine (PEPCID) IV 100 mL/hr at 06/24/20 1500  . insulin 1.4 mL/hr at 06/24/20 1500  . lactated ringers    . lactated ringers    . lactated ringers 20 mL/hr at 06/24/20 1500  . [START ON 06/25/2020] levofloxacin (LEVAQUIN) IV    . magnesium sulfate 20 mL/hr at 06/24/20 1500  . nitroGLYCERIN 7 mcg/min (06/24/20 1500)  . phenylephrine (NEO-SYNEPHRINE) Adult infusion Stopped (06/24/20 1455)  . potassium chloride    . vancomycin      I/O last 3 completed shifts: In: 1473.2 [P.O.:480;  I.V.:993.2] Out: 3350 [Urine:3350]   CBC Latest Ref Rng & Units 06/24/2020 06/24/2020 06/24/2020  WBC 4.0 - 10.5 K/uL - 15.3(H) -  Hemoglobin 13.0 - 17.0 g/dL 11.9(L) 12.5(L) 11.2(L)  Hematocrit 39 - 52 % 35.0(L) 37.7(L) 33.0(L)  Platelets 150 - 400 K/uL - 180 -    BMP Latest Ref Rng & Units 06/24/2020 06/24/2020 06/24/2020  Glucose 70 - 99 mg/dL - 08/24/2020) -  BUN 6 - 20 mg/dL - 9 -  Creatinine 250(N - 1.24 mg/dL - 3.97 -  Sodium 6.73 - 145 mmol/L 139 138 139  Potassium 3.5 - 5.1 mmol/L 4.0 3.8 3.8  Chloride 98 - 111 mmol/L - 101 -  CO2 22 - 32 mmol/L - - -  Calcium 8.9 - 10.3 mg/dL - - -    ABG    Component Value Date/Time   PHART 7.361 06/24/2020 1451   PCO2ART 45.0 06/24/2020 1451   PO2ART 69 (L) 06/24/2020 1451   HCO3 25.8 06/24/2020 1451   TCO2 27 06/24/2020 1451   O2SAT 94.0 06/24/2020 1451       08/24/2020, MD 06/24/2020 4:30 PM

## 2020-06-24 NOTE — Discharge Instructions (Signed)

## 2020-06-24 NOTE — Anesthesia Preprocedure Evaluation (Addendum)
Anesthesia Evaluation  Patient identified by MRN, date of birth, ID band Patient awake    Reviewed: Allergy & Precautions, NPO status , Patient's Chart, lab work & pertinent test results  History of Anesthesia Complications Negative for: history of anesthetic complications  Airway Mallampati: II  TM Distance: >3 FB Neck ROM: Full    Dental  (+) Dental Advisory Given, Missing   Pulmonary neg COPD, neg recent URI, Current Smoker and Patient abstained from smoking.,  Covid-19 Nucleic Acid Test Results Lab Results      Component                Value               Date                      SARSCOV2NAA              NEGATIVE            06/23/2020              breath sounds clear to auscultation       Cardiovascular hypertension, + CAD, + Past MI, + Cardiac Stents and +CHF   Rhythm:Regular  Conclusions: 1. Severe three-vessel coronary artery disease, including occluded proximal/mid LAD stent with minimal collateral filling of the mid/distal LAD, sequential 90% and 70% mid/distal LCx lesions, and chronic total occlusion of mid RCA stent with bridging and left-to-right collaterals. 2. Severely reduced left ventricular systolic function (LVEF 25-30%) with moderately elevated filling pressure (LVEDP ~30 mmHg). 3. Successful placement of 50 mL IABP via the right common femoral artery.  1. Left ventricular ejection fraction, by estimation, is 35 to 40%. The  left ventricle has moderately decreased function. The left ventricle  demonstrates regional wall motion abnormalities (see scoring  diagram/findings for description). Left ventricular  diastolic parameters are indeterminate. There is akinesis of the left  ventricular, entire anteroseptal wall. There is moderate hypokinesis of  the left ventricular, entire inferoseptal wall, anterior wall and apical  segment. There is severe hypokinesis  of the left ventricular, mid-apical anterior wall  and inferior wall.  2. Right ventricular systolic function is mildly reduced. The right  ventricular size is normal. Tricuspid regurgitation signal is inadequate  for assessing PA pressure.  3. The mitral valve is normal in structure. Mild mitral valve  regurgitation. No evidence of mitral stenosis.  4. The aortic valve is tricuspid. Aortic valve regurgitation is not  visualized. No aortic stenosis is present.  5. Aortic presence of intra-aortic balloon pump.  6. The inferior vena cava is normal in size with <50% respiratory  variability, suggesting right atrial pressure of 8 mmHg.    Neuro/Psych negative neurological ROS  negative psych ROS   GI/Hepatic negative GI ROS, Neg liver ROS,   Endo/Other  negative endocrine ROS  Renal/GU Renal diseaseLab Results      Component                Value               Date                      CREATININE               0.90                06/24/2020           Lab Results  Component                Value               Date                      K                        3.1 (L)             06/24/2020                Musculoskeletal negative musculoskeletal ROS (+)   Abdominal   Peds  Hematology negative hematology ROS (+) Lab Results      Component                Value               Date                      WBC                      12.6 (H)            06/24/2020                HGB                      13.9                06/24/2020                HCT                      41.0                06/24/2020                MCV                      89.7                06/24/2020                PLT                      274                 06/24/2020              Anesthesia Other Findings Balloon pump  Reproductive/Obstetrics                            Anesthesia Physical Anesthesia Plan  ASA: IV  Anesthesia Plan: General   Post-op Pain Management:    Induction: Intravenous  PONV Risk Score and Plan: 1  and Treatment may vary due to age or medical condition  Airway Management Planned: Oral ETT  Additional Equipment: Arterial line, CVP, PA Cath, TEE and Ultrasound Guidance Line Placement  Intra-op Plan:   Post-operative Plan: Post-operative intubation/ventilation  Informed Consent: I have reviewed the patients History and Physical, chart, labs and discussed the procedure including the risks, benefits and alternatives for the proposed anesthesia with the patient or authorized representative who has indicated his/her understanding and acceptance.     Dental advisory given  Plan Discussed with: CRNA and Surgeon  Anesthesia Plan Comments:         Anesthesia Quick Evaluation

## 2020-06-24 NOTE — Anesthesia Procedure Notes (Signed)
Procedure Name: Intubation Date/Time: 06/24/2020 7:52 AM Performed by: Gaylene Brooks, CRNA Pre-anesthesia Checklist: Patient identified, Emergency Drugs available, Suction available, Patient being monitored and Timeout performed Patient Re-evaluated:Patient Re-evaluated prior to induction Oxygen Delivery Method: Circle system utilized Preoxygenation: Pre-oxygenation with 100% oxygen Induction Type: IV induction Ventilation: Mask ventilation without difficulty Laryngoscope Size: Mac and 4 Grade View: Grade I Tube type: Oral Tube size: 8.0 mm Number of attempts: 1 Airway Equipment and Method: Stylet Placement Confirmation: ETT inserted through vocal cords under direct vision,  positive ETCO2,  CO2 detector and breath sounds checked- equal and bilateral Secured at: 24 cm Tube secured with: Tape Dental Injury: Teeth and Oropharynx as per pre-operative assessment

## 2020-06-24 NOTE — Hospital Course (Addendum)
HPI: This is a 39 year old man status post PCI 10 years ago who has been lost to follow-up recently.  He experienced sudden onset of chest pain with radiation to his left arm last 06/22/2020.  He presented to the [facility] emergency department overnight where he was diagnosed with NSTEMI.  He was transferred to [facility] for cardiac catheterization on  06/23/2020. Results showed severe multivessel coronary artery disease,including a total LAD.  He has depressed ejection fraction but remains hemodynamically stable.  On the table ,his pain which was 10 out of 10 is now 2 out of 10.  IABP was placed.  Echo showed LVEF 35-40%,  regional wall motion abnormalities, and no significant valvular abnormalities. Patient has continued to work up until this point. Consultation is received for cardiac surgery.  Dr. Vickey Sages discussed the need for coronary artery bypass grafting surgery. Potential risks, benefits, and complications of there surgery were discussed with the patient and he agreed to proceed with surgery. Pre operative carotid duplex US showed no significant internal carotid artery stenosis bilaterally. He underwent a CABG x 6 by Dr. Vickey Sages on 06/24/2020.  Hospital Course: Patient was extubated early afternoon on post operative day one without difficulty. He remained afebrile and hemodynamically stable. He was weaned off Neo Synephrine and Nitro drips. Theone Murdoch, a line, and foley were removed early in his post operative course. Chest tubes remained for a few days and once output decreased, they were removed. He was started on Isordil (for radial artery harvest). He was not started on Lopressor secondary to bradycardia. He was volume overloaded and diuresed. He was weaned off the Insulin drip. His HGA1C was 5.2. Accu checks and SS were stopped as glucose remained well controlled. He was felt surgically stable for transfer to the progressive care floor 2C for further convalescence on 09/13. He has been  ambulating on room air. He has been tolerating a diet and has had a bowel movement. His sternal, LUE and RLE wounds are clean, dry, and healing without sign of infection. He did have numbness of left thumb and 3rd and 4th fingers but motor intact. Epicardial pacing wires were removed on 09/14. Chest tube sutures will be removed the day of discharge. Patient is felt surgically stable for discharge today

## 2020-06-24 NOTE — Progress Notes (Signed)
ANTICOAGULATION CONSULT NOTE  Pharmacy Consult for heparin Indication: IABP  Allergies  Allergen Reactions  . Penicillins Anaphylaxis, Swelling and Rash    "Rash from head to toe, throat closed up" with amoxicillin at 39 years old  PCN reaction causing immediate rash, facial/tongue/throat swelling, SOB or lightheadedness with hypotension: Yes Has patient had a PCN reaction causing severe rash involving mucus membranes or skin necrosis: Yes Has patient had a PCN reaction that required hospitalization: No Has patient had a PCN reaction occurring within the last 10 years: No If all of the above answers are "NO", then may proceed with Cephalosporin use.    Patient Measurements: Height: 5\' 9"  (175.3 cm) Weight: 104.3 kg (230 lb) IBW/kg (Calculated) : 70.7 Heparin Dosing Weight: 93.2 kg   Vital Signs: Temp: 98.4 F (36.9 C) (09/10 0400) Temp Source: Oral (09/10 0400) BP: 119/89 (09/10 0500)  Labs: Recent Labs    06/23/20 0400 06/23/20 0400 06/23/20 0552 06/23/20 1330 06/23/20 2047 06/24/20 0440 06/24/20 0446  HGB 16.8   < >  --   --   --  14.6 14.3  HCT 49.6  --   --   --   --  43.5 42.0  PLT 352  --   --   --   --  274  --   HEPARINUNFRC  --   --   --   --  0.10* 0.19*  --   CREATININE 0.98  --   --  0.84  --  0.95  --   TROPONINIHS 771*   < > 2,292* 21,634* >27,000*  --   --    < > = values in this interval not displayed.    Estimated Creatinine Clearance: 124.2 mL/min (by C-G formula based on SCr of 0.95 mg/dL).   Medical History: Past Medical History:  Diagnosis Date  . CAD (coronary artery disease)    inferiot MI; LHC with EF 55% and basal to mid inferior hypokinesis. totally occluded RCA w/weak L-R collaterals. 50% ostial OM1. long 70% proximal LAD. Pt had promus DES to LAD and RCA. echo 9/11: EF 55%, no regional wall motion abnormalities   . GI bleed 9/11   secondary to mallory-weiss tear with endoscopic clipping   . Hyperlipidemia   . Hypertension   .  Nephrolithiasis   . Transfusion history 2011/2012  . 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 aspirin EC  81 mg Oral Daily  . Chlorhexidine Gluconate Cloth  6 each Topical Daily  . epinephrine  0-10 mcg/min Intravenous To OR  . furosemide  40 mg Intravenous BID  . heparin-papaverine-plasmalyte irrigation   Irrigation To OR  . insulin   Intravenous To OR  . magnesium sulfate  40 mEq Other To OR  . phenylephrine  30-200 mcg/min Intravenous To OR  . potassium chloride  80 mEq Other To OR  . rosuvastatin  40 mg Oral Daily  . sodium chloride flush  3 mL Intravenous Q12H  . tranexamic acid  15 mg/kg Intravenous To OR  . tranexamic acid  2 mg/kg Intracatheter To OR    Assessment: 39 yom with a hx of CAD s/p PCI of RCA and LAD in 2011 and hx GIB found to have NSTEMI.   Found to have 3v CAD with severely reduced LVEF 25-30% with LVEDP ~30 mmHg now s/p IABP placement. -heparin level = 0.19  Goal of Therapy:  Heparin level 0.2-0.5 units/ml  Monitor platelets by anticoagulation protocol: Yes   Plan:  -CABG planned this morning  -No heparin adjustments with procedural plans  Harland German, PharmD Clinical Pharmacist **Pharmacist phone directory can now be found on [email or web address] ([email or web address]).  Listed under [facility].

## 2020-06-24 NOTE — H&P (Signed)
History and Physical Interval Note:  06/24/2020 7:21 AM  [name]  has presented today for surgery, with the diagnosis of NSTEMI.  The various methods of treatment have been discussed with the patient and family. After consideration of risks, benefits and other options for treatment, the patient has consented to  PROCEDURE: coronary artery bypass grafting and associated procedures; possible left radial artery harvesting; possible Impella insertion as a surgical intervention.  The patient's history has been reviewed, patient examined, no change in status, stable for surgery.  I have reviewed the patient's chart and labs.  Questions were answered to the patient's satisfaction.     Linden Dolin

## 2020-06-24 NOTE — Progress Notes (Signed)
  Echocardiogram Echocardiogram Transesophageal has been performed.  Gerda Diss 06/24/2020, 9:15 AM

## 2020-06-24 NOTE — Transfer of Care (Signed)
Immediate Anesthesia Transfer of Care Note  Patient: [name]  Procedure(s) Performed: CORONARY ARTERY BYPASS GRAFTING (CABG) x 6, bilateral IMA, LIMA TO LAD, RIMA TO PDA, LEFT RADIAL ARTERY TO DIAG. 1 SEQ TO OM1, SVG TO OM2, SVG TO ACUTE MARGINAL (N/A Chest) RADIAL ARTERY HARVEST (Left Arm Lower) possible, PLACEMENT OF IMPELLA LEFT VENTRICULAR ASSIST DEVICE 5.5 (N/A ) TRANSESOPHAGEAL ECHOCARDIOGRAM (TEE) (N/A ) INDOCYANINE GREEN FLUORESCENCE IMAGING (ICG) (N/A ) ENDOVEIN HARVEST OF GREATER SAPHENOUS VEIN (Right )  Patient Location: SICU  Anesthesia Type:General  Level of Consciousness: sedated and Patient remains intubated per anesthesia plan  Airway & Oxygen Therapy: Patient remains intubated per anesthesia plan and Patient placed on Ventilator (see vital sign flow sheet for setting)  Post-op Assessment: Report given to RN and Post -op Vital signs reviewed and stable  Post vital signs: Reviewed and stable  Last Vitals:  Vitals Value Taken Time  BP    Temp    Pulse 86 06/24/20 1425  Resp 12 06/24/20 1425  SpO2    Vitals shown include unvalidated device data.  Last Pain:  Vitals:   06/24/20 0400  TempSrc: Oral  PainSc: 0-No pain      Patients Stated Pain Goal: 0 (06/23/20 1800)  Complications: No complications documented.

## 2020-06-24 NOTE — Anesthesia Procedure Notes (Signed)
Central Venous Catheter Insertion Performed by: Oleta Mouse, MD, anesthesiologist Start/End9/07/2020 8:04 AM, 06/24/2020 8:12 AM Patient location: OR. Preanesthetic checklist: patient identified, IV checked, site marked, risks and benefits discussed, surgical consent, monitors and equipment checked, pre-op evaluation, timeout performed and anesthesia consent Lidocaine 1% used for infiltration and patient sedated Hand hygiene performed  and maximum sterile barriers used  Catheter size: 9 Fr Total catheter length 10. MAC introducer Procedure performed using ultrasound guided technique. Ultrasound Notes:anatomy identified, needle tip was noted to be adjacent to the nerve/plexus identified, no ultrasound evidence of intravascular and/or intraneural injection and image(s) printed for medical record Attempts: 1 Following insertion, line sutured. Post procedure assessment: blood return through all ports, free fluid flow and no air  Patient tolerated the procedure well with no immediate complications.

## 2020-06-24 NOTE — Anesthesia Procedure Notes (Signed)
Arterial Line Insertion Start/End9/07/2020 7:40 AM, 06/24/2020 7:50 AM Performed by: Rachel Moulds, CRNA, CRNA  Patient location: OR. Preanesthetic checklist: patient identified, IV checked, site marked, risks and benefits discussed, surgical consent, monitors and equipment checked, pre-op evaluation, timeout performed and anesthesia consent Lidocaine 1% used for infiltration and patient sedated Right, radial was placed Catheter size: 20 G Hand hygiene performed  and maximum sterile barriers used   Attempts: 1 Procedure performed without using ultrasound guided technique. Following insertion, dressing applied and Biopatch. Post procedure assessment: normal

## 2020-06-24 NOTE — Brief Op Note (Addendum)
06/23/2020 - 06/24/2020  12:44 PM  PATIENT:  [name]  39 y.o. male  PRE-OPERATIVE DIAGNOSIS:  1. S/p NSTEMI 2. CORONARY ARTERY DISEASE  POST-OPERATIVE DIAGNOSIS:  1. S/p NSTEMI 2. CORONARY ARTERY DISEASE  PROCEDURE: TRANSESOPHAGEAL ECHOCARDIOGRAM (TEE), CORONARY ARTERY BYPASS GRAFTING (CABG) x 6 (LIMA TO LAD, , LEFT RADIAL ARTERY SEQUENTIALLY to DIAGONAL and OM1, SVG TO OM2, SVG TO ACUTE MARGINAL,RIMA TO PDA) with BILATERAL INTERNAL MAMMARY ARTERY HARVEST and LEFT RADIAL ARTERY HARVEST ,ENDOVEIN HARVEST OF RIGHT GREATER SAPHENOUS THIGH VEIN, and INDOCYANINE GREEN FLUORESCENCE IMAGING (ICG)   SURGEON:  Surgeon(s) and Role:    Linden Dolin, MD - Primary  PHYSICIAN ASSISTANT: Doree Fudge PA-C  ASSISTANTS: Orvan Seen RNFA  ANESTHESIA:   general  EBL:  Per anesthesia and perfusion record  DRAINS: Chest tubes placed in the mediastinal and pleural spaces   LOCAL MEDICATIONS USED:  OTHER Exparel  COUNTS CORRECT:  YES  DICTATION: .Dragon Dictation  PLAN OF CARE: Admit to inpatient   PATIENT DISPOSITION:  ICU - intubated and hemodynamically stable.   Delay start of Pharmacological VTE agent (>24hrs) due to surgical blood loss or risk of bleeding: yes  BASELINE WEIGHT: 104.3 kg  Agree with documenation. Hagar Sadiq Z. Vickey Sages, MD [phone]

## 2020-06-24 NOTE — Progress Notes (Signed)
   06/24/20 1600  Clinical Encounter Type  Visited With Family;Patient not available  Visit Type Spiritual support  Referral From Family  Consult/Referral To Chaplain  Spiritual Encounters  Spiritual Needs Prayer  Stress Factors  Patient Stress Factors Health changes  Family Stress Factors Health changes  Chaplain visited with family in [facility] waiting room. Patient was just out of bypass surgery. Family concerned that he is on vent. Chaplain offered spiritual support.

## 2020-06-24 NOTE — Anesthesia Procedure Notes (Signed)
Central Venous Catheter Insertion Performed by: Val Eagle, MD, anesthesiologist Start/End9/07/2020 8:04 AM, 06/24/2020 8:12 AM Patient location: OR. Preanesthetic checklist: patient identified, IV checked, site marked, risks and benefits discussed, surgical consent, monitors and equipment checked, pre-op evaluation, timeout performed and anesthesia consent Hand hygiene performed  and maximum sterile barriers used  PA cath was placed.Swan type:thermodilution PA Cath depth:46 Procedure performed without using ultrasound guided technique. Attempts: 1 Patient tolerated the procedure well with no immediate complications.

## 2020-06-25 ENCOUNTER — Inpatient Hospital Stay (HOSPITAL_COMMUNITY): Payer: Self-pay

## 2020-06-25 LAB — CBC
HCT: 36.5 % — ABNORMAL LOW (ref 39.0–52.0)
HCT: 36.5 % — ABNORMAL LOW (ref 39.0–52.0)
Hemoglobin: 12.1 g/dL — ABNORMAL LOW (ref 13.0–17.0)
Hemoglobin: 12.3 g/dL — ABNORMAL LOW (ref 13.0–17.0)
MCH: 30.2 pg (ref 26.0–34.0)
MCH: 31 pg (ref 26.0–34.0)
MCHC: 33.2 g/dL (ref 30.0–36.0)
MCHC: 33.7 g/dL (ref 30.0–36.0)
MCV: 91 fL (ref 80.0–100.0)
MCV: 91.9 fL (ref 80.0–100.0)
Platelets: 189 10*3/uL (ref 150–400)
Platelets: 215 10*3/uL (ref 150–400)
RBC: 4.01 MIL/uL — ABNORMAL LOW (ref 4.22–5.81)
RBC: 3.97 MIL/uL — ABNORMAL LOW (ref 4.22–5.81)
RDW: 13.3 % (ref 11.5–15.5)
RDW: 13.4 % (ref 11.5–15.5)
WBC: 10.8 10*3/uL — ABNORMAL HIGH (ref 4.0–10.5)
WBC: 12.7 10*3/uL — ABNORMAL HIGH (ref 4.0–10.5)
nRBC: 0 % (ref 0.0–0.2)
nRBC: 0 % (ref 0.0–0.2)

## 2020-06-25 LAB — BASIC METABOLIC PANEL
Anion gap: 7 (ref 5–15)
Anion gap: 9 (ref 5–15)
BUN: 9 mg/dL (ref 6–20)
BUN: 11 mg/dL (ref 6–20)
CO2: 21 mmol/L — ABNORMAL LOW (ref 22–32)
CO2: 21 mmol/L — ABNORMAL LOW (ref 22–32)
Calcium: 7.3 mg/dL — ABNORMAL LOW (ref 8.9–10.3)
Calcium: 7.8 mg/dL — ABNORMAL LOW (ref 8.9–10.3)
Chloride: 110 mmol/L (ref 98–111)
Chloride: 107 mmol/L (ref 98–111)
Creatinine, Ser: 1.04 mg/dL (ref 0.61–1.24)
Creatinine, Ser: 0.91 mg/dL (ref 0.61–1.24)
GFR calc Af Amer: >60 mL/min (ref >60)
GFR calc Af Amer: >60 mL/min (ref >60)
GFR calc non Af Amer: >60 mL/min (ref >60)
GFR calc non Af Amer: >60 mL/min (ref >60)
Glucose, Bld: 109 mg/dL — ABNORMAL HIGH (ref 70–99)
Glucose, Bld: 120 mg/dL — ABNORMAL HIGH (ref 70–99)
Potassium: 3.5 mmol/L (ref 3.5–5.1)
Potassium: 3.6 mmol/L (ref 3.5–5.1)
Sodium: 138 mmol/L (ref 135–145)
Sodium: 137 mmol/L (ref 135–145)

## 2020-06-25 LAB — POCT I-STAT 7, (LYTES, BLD GAS, ICA,H+H)
Acid-base deficit: 1 mmol/L (ref 0.0–2.0)
Acid-base deficit: 6 mmol/L — ABNORMAL HIGH (ref 0.0–2.0)
Bicarbonate: 22.8 mmol/L (ref 20.0–28.0)
Bicarbonate: 17.4 mmol/L — ABNORMAL LOW (ref 20.0–28.0)
Calcium, Ion: 1.15 mmol/L (ref 1.15–1.40)
Calcium, Ion: 1.06 mmol/L — ABNORMAL LOW (ref 1.15–1.40)
HCT: 35 % — ABNORMAL LOW (ref 39.0–52.0)
HCT: 30 % — ABNORMAL LOW (ref 39.0–52.0)
Hemoglobin: 11.9 g/dL — ABNORMAL LOW (ref 13.0–17.0)
Hemoglobin: 10.2 g/dL — ABNORMAL LOW (ref 13.0–17.0)
O2 Saturation: 97 %
O2 Saturation: 97 %
Patient temperature: 38.3
Patient temperature: 37.4
Potassium: 3.7 mmol/L (ref 3.5–5.1)
Potassium: 3.2 mmol/L — ABNORMAL LOW (ref 3.5–5.1)
Sodium: 139 mmol/L (ref 135–145)
Sodium: 143 mmol/L (ref 135–145)
TCO2: 24 mmol/L (ref 22–32)
TCO2: 18 mmol/L — ABNORMAL LOW (ref 22–32)
pCO2 arterial: 37.8 mmHg (ref 32.0–48.0)
pCO2 arterial: 28.5 mmHg — ABNORMAL LOW (ref 32.0–48.0)
pH, Arterial: 7.395 (ref 7.350–7.450)
pH, Arterial: 7.394 (ref 7.350–7.450)
pO2, Arterial: 101 mmHg (ref 83.0–108.0)
pO2, Arterial: 94 mmHg (ref 83.0–108.0)

## 2020-06-25 LAB — GLUCOSE, CAPILLARY
Glucose-Capillary: 117 mg/dL — ABNORMAL HIGH (ref 70–99)
Glucose-Capillary: 120 mg/dL — ABNORMAL HIGH (ref 70–99)
Glucose-Capillary: 99 mg/dL (ref 70–99)
Glucose-Capillary: 97 mg/dL (ref 70–99)
Glucose-Capillary: 107 mg/dL — ABNORMAL HIGH (ref 70–99)
Glucose-Capillary: 116 mg/dL — ABNORMAL HIGH (ref 70–99)

## 2020-06-25 LAB — MAGNESIUM
Magnesium: 2.1 mg/dL (ref 1.7–2.4)
Magnesium: 2 mg/dL (ref 1.7–2.4)

## 2020-06-25 MED ORDER — POTASSIUM CHLORIDE 10 MEQ/50ML IV SOLN
10.0000 meq | INTRAVENOUS | Status: AC
  Administered 2020-06-25 (×3): 10 meq via INTRAVENOUS
  Filled 2020-06-25 (×3): qty 50

## 2020-06-25 MED ORDER — POTASSIUM CHLORIDE CRYS ER 20 MEQ PO TBCR
20.0000 meq | EXTENDED_RELEASE_TABLET | ORAL | Status: AC
  Administered 2020-06-25 – 2020-06-26 (×2): 20 meq via ORAL
  Filled 2020-06-25 (×2): qty 1

## 2020-06-25 NOTE — Progress Notes (Signed)
[address]       [place] [address]             [phone]                 1 Day Post-Op Procedure(s) (LRB): CORONARY ARTERY BYPASS GRAFTING (CABG) x 6, bilateral IMA, LIMA TO LAD, RIMA TO PDA, LEFT RADIAL ARTERY TO DIAG. 1 SEQ TO OM1, SVG TO OM2, SVG TO ACUTE MARGINAL (N/A) RADIAL ARTERY HARVEST (Left) possible, PLACEMENT OF IMPELLA LEFT VENTRICULAR ASSIST DEVICE 5.5 (N/A) TRANSESOPHAGEAL ECHOCARDIOGRAM (TEE) (N/A) INDOCYANINE GREEN FLUORESCENCE IMAGING (ICG) (N/A) ENDOVEIN HARVEST OF GREATER SAPHENOUS VEIN (Right)   Events: No events overnight Stable hemodynamic _______________________________________________________________ Vitals: BP (!) 121/54   Pulse 86   Temp 100 F (37.8 C)   Resp 19   Ht 5\' 9"  (1.753 m)   Wt 104.3 kg   SpO2 100%   BMI 33.97 kg/m   - Neuro: follows commands, sedated  - Cardiovascular: Sinus  Drips: Nitro 7, neo 5.   PAP: (21-33)/(12-21) 22/14 CVP:  [10 mmHg] 10 mmHg CO:  [3.7 L/min-5 L/min] 4.4 L/min CI:  [1.7 L/min/m2-2.3 L/min/m2] 2 L/min/m2  - Pulm: EWOB, clear Vent Mode: SIMV;PRVC;PSV FiO2 (%):  [50 %] 50 % Set Rate:  [12 bmp] 12 bmp Vt Set:  [560 mL] 560 mL PEEP:  [5 cmH20] 5 cmH20 Pressure Support:  [10 cmH20] 10 cmH20 Plateau Pressure:  [13 cmH20-14 cmH20] 14 cmH20  ABG    Component Value Date/Time   PHART 7.395 06/25/2020 0444   PCO2ART 37.8 06/25/2020 0444   PO2ART 101 06/25/2020 0444   HCO3 22.8 06/25/2020 0444   TCO2 24 06/25/2020 0444   ACIDBASEDEF 1.0 06/25/2020 0444   O2SAT 97.0 06/25/2020 0444    - Abd: soft - Extremity: warm  .Intake/Output      09/10 0701 - 09/11 0700 09/11 0701 - 09/12 0700   P.O.     I.V. (mL/kg) 4299.1 (41.2) 62.8 (0.6)   Blood 400    IV Piggyback 1045.9 44.9   Total Intake(mL/kg) 5745 (55.1) 107.7 (1)   Urine (mL/kg/hr) 1705 (0.7)    Emesis/NG output 0    Blood 615    Chest Tube 630    Total Output 2950    Net +2795 +107.7            _______________________________________________________________ Labs: CBC Latest Ref Rng & Units 06/25/2020 06/25/2020 06/24/2020  WBC 4.0 - 10.5 K/uL - 10.8(H) 12.0(H)  Hemoglobin 13.0 - 17.0 g/dL 11.9(L) 12.1(L) 12.5(L)  Hematocrit 39 - 52 % 35.0(L) 36.5(L) 36.9(L)  Platelets 150 - 400 K/uL - 189 204   CMP Latest Ref Rng & Units 06/25/2020 06/25/2020 06/24/2020  Glucose 70 - 99 mg/dL - 08/24/2020) 400(Q)  BUN 6 - 20 mg/dL - 9 10  Creatinine 676(P - 1.24 mg/dL - 9.50 9.32  Sodium 6.71 - 145 mmol/L 139 138 137  Potassium 3.5 - 5.1 mmol/L 3.7 3.5 3.9  Chloride 98 - 111 mmol/L - 110 108  CO2 22 - 32 mmol/L - 21(L) 22  Calcium 8.9 - 10.3 mg/dL - 7.3(L) 7.3(L)  Total Protein 6.5 - 8.1 g/dL - - 5.0(L)  Total Bilirubin 0.3 - 1.2 mg/dL - - 0.8  Alkaline Phos 38 - 126 U/L - - 35(L)  AST 15 - 41 U/L - - 159(H)  ALT 0 - 44 U/L - - 47(H)    CXR: No effusion  _______________________________________________________________  Assessment and Plan: POD  1 s/p CABG,   Neuro: wean sedation as tolerated CV: IABP out this am.  Wean nitro and neo as needed.  Will remove swan once extubated Pulm: wean to extubate Renal: creat stable.  Will watch uop GI: clears after extubation Heme: stable ID: low grade fevers, will follow Endo: SSI today Dispo: continue ICU care  Brynda Greathouse, MD 06/25/2020 9:35 AM

## 2020-06-25 NOTE — Procedures (Signed)
Extubation Procedure Note  Patient Details:   Name: [name] DOB: [birth year] MRN: [id number]   Airway Documentation:    Vent end date: 06/25/20 Vent end time: 1214   Evaluation  O2 sats: stable throughout Complications: No apparent complications Patient did tolerate procedure well. Bilateral Breath Sounds: Clear, Diminished   Yes  Incentive spirometer performed 4L min [place] placed NIF-40 FVC  Newt Lukes 06/25/2020, 12:15 PM

## 2020-06-25 NOTE — Progress Notes (Signed)
RT note- weaning started

## 2020-06-26 LAB — GLUCOSE, CAPILLARY
Glucose-Capillary: 102 mg/dL — ABNORMAL HIGH (ref 70–99)
Glucose-Capillary: 107 mg/dL — ABNORMAL HIGH (ref 70–99)
Glucose-Capillary: 116 mg/dL — ABNORMAL HIGH (ref 70–99)
Glucose-Capillary: 125 mg/dL — ABNORMAL HIGH (ref 70–99)
Glucose-Capillary: 131 mg/dL — ABNORMAL HIGH (ref 70–99)
Glucose-Capillary: 162 mg/dL — ABNORMAL HIGH (ref 70–99)

## 2020-06-26 LAB — BASIC METABOLIC PANEL
Anion gap: 9 (ref 5–15)
BUN: 10 mg/dL (ref 6–20)
CO2: 22 mmol/L (ref 22–32)
Calcium: 8 mg/dL — ABNORMAL LOW (ref 8.9–10.3)
Chloride: 105 mmol/L (ref 98–111)
Creatinine, Ser: 0.85 mg/dL (ref 0.61–1.24)
GFR calc Af Amer: >60 mL/min (ref >60)
GFR calc non Af Amer: >60 mL/min (ref >60)
Glucose, Bld: 113 mg/dL — ABNORMAL HIGH (ref 70–99)
Potassium: 3.5 mmol/L (ref 3.5–5.1)
Sodium: 136 mmol/L (ref 135–145)

## 2020-06-26 LAB — CBC
HCT: 36.1 % — ABNORMAL LOW (ref 39.0–52.0)
Hemoglobin: 12.2 g/dL — ABNORMAL LOW (ref 13.0–17.0)
MCH: 31.2 pg (ref 26.0–34.0)
MCHC: 33.8 g/dL (ref 30.0–36.0)
MCV: 92.3 fL (ref 80.0–100.0)
Platelets: 248 10*3/uL (ref 150–400)
RBC: 3.91 MIL/uL — ABNORMAL LOW (ref 4.22–5.81)
RDW: 13.8 % (ref 11.5–15.5)
WBC: 13.9 10*3/uL — ABNORMAL HIGH (ref 4.0–10.5)
nRBC: 0 % (ref 0.0–0.2)

## 2020-06-26 MED ORDER — POTASSIUM CHLORIDE CRYS ER 20 MEQ PO TBCR
20.0000 meq | EXTENDED_RELEASE_TABLET | ORAL | Status: AC
  Administered 2020-06-26 (×2): 20 meq via ORAL
  Filled 2020-06-26 (×2): qty 1

## 2020-06-26 MED ORDER — ENOXAPARIN SODIUM 40 MG/0.4ML ~~LOC~~ SOLN
40.0000 mg | Freq: Every day | SUBCUTANEOUS | Status: DC
  Administered 2020-06-26 – 2020-06-28 (×3): 40 mg via SUBCUTANEOUS
  Filled 2020-06-26 (×3): qty 0.4

## 2020-06-26 MED ORDER — INSULIN ASPART 100 UNIT/ML ~~LOC~~ SOLN: 0.0000 [IU] | SUBCUTANEOUS | Status: DC

## 2020-06-26 NOTE — Progress Notes (Signed)
EVENING ROUNDS NOTE :     [address]       [place] [address]             [phone]                 2 Days Post-Op Procedure(s) (LRB): CORONARY ARTERY BYPASS GRAFTING (CABG) x 6, bilateral IMA, LIMA TO LAD, RIMA TO PDA, LEFT RADIAL ARTERY TO DIAG. 1 SEQ TO OM1, SVG TO OM2, SVG TO ACUTE MARGINAL (N/A) RADIAL ARTERY HARVEST (Left) possible, PLACEMENT OF IMPELLA LEFT VENTRICULAR ASSIST DEVICE 5.5 (N/A) TRANSESOPHAGEAL ECHOCARDIOGRAM (TEE) (N/A) INDOCYANINE GREEN FLUORESCENCE IMAGING (ICG) (N/A) ENDOVEIN HARVEST OF GREATER SAPHENOUS VEIN (Right)   Total Length of Stay:  LOS: 3 days  Events:   No events Likely floor tomorrow    BP 97/61   Pulse 76   Temp 98.1 F (36.7 C) (Oral)   Resp (!) 26   Ht 5\' 9"  (1.753 m)   Wt 105.4 kg   SpO2 91%   BMI 34.31 kg/m         . sodium chloride Stopped (06/25/20 1606)  . sodium chloride    . sodium chloride 10 mL/hr at 06/24/20 1450  . amiodarone 30 mg/hr (06/26/20 2000)  . dexmedetomidine (PRECEDEX) IV infusion Stopped (06/25/20 0944)  . insulin Stopped (06/24/20 2251)  . lactated ringers    . lactated ringers    . lactated ringers 20 mL/hr at 06/26/20 2000  . nitroGLYCERIN Stopped (06/25/20 1003)  . phenylephrine (NEO-SYNEPHRINE) Adult infusion 5 mcg/min (06/26/20 2000)    I/O last 3 completed shifts: In: 4368.9 [P.O.:1440; I.V.:2634; IV Piggyback:294.9] Out: 1530 [Urine:1220; Chest Tube:310]   CBC Latest Ref Rng & Units 06/26/2020 06/25/2020 06/25/2020  WBC 4.0 - 10.5 K/uL 13.9(H) 12.7(H) -  Hemoglobin 13.0 - 17.0 g/dL 12.2(L) 12.3(L) 10.2(L)  Hematocrit 39 - 52 % 36.1(L) 36.5(L) 30.0(L)  Platelets 150 - 400 K/uL 248 215 -    BMP Latest Ref Rng & Units 06/26/2020 06/25/2020 06/25/2020  Glucose 70 - 99 mg/dL 08/25/2020) 277(A) -  BUN 6 - 20 mg/dL 10 11 -  Creatinine 128(N - 1.24 mg/dL 8.67 6.72 -  Sodium 0.94 - 145 mmol/L 136 137 143  Potassium 3.5 - 5.1 mmol/L 3.5 3.6 3.2(L)  Chloride 98 - 111 mmol/L 105  107 -  CO2 22 - 32 mmol/L 22 21(L) -  Calcium 8.9 - 10.3 mg/dL 8.0(L) 7.8(L) -    ABG    Component Value Date/Time   PHART 7.394 06/25/2020 1204   PCO2ART 28.5 (L) 06/25/2020 1204   PO2ART 94 06/25/2020 1204   HCO3 17.4 (L) 06/25/2020 1204   TCO2 18 (L) 06/25/2020 1204   ACIDBASEDEF 6.0 (H) 06/25/2020 1204   O2SAT 97.0 06/25/2020 1204       08/25/2020, MD 06/26/2020 8:43 PM

## 2020-06-26 NOTE — Progress Notes (Signed)
[address]       [place] [address]             [phone]                 2 Days Post-Op Procedure(s) (LRB): CORONARY ARTERY BYPASS GRAFTING (CABG) x 6, bilateral IMA, LIMA TO LAD, RIMA TO PDA, LEFT RADIAL ARTERY TO DIAG. 1 SEQ TO OM1, SVG TO OM2, SVG TO ACUTE MARGINAL (N/A) RADIAL ARTERY HARVEST (Left) possible, PLACEMENT OF IMPELLA LEFT VENTRICULAR ASSIST DEVICE 5.5 (N/A) TRANSESOPHAGEAL ECHOCARDIOGRAM (TEE) (N/A) INDOCYANINE GREEN FLUORESCENCE IMAGING (ICG) (N/A) ENDOVEIN HARVEST OF GREATER SAPHENOUS VEIN (Right)   Events: No events overnight  _______________________________________________________________ Vitals: BP (!) 93/58   Pulse 80   Temp 98.1 F (36.7 C) (Oral)   Resp (!) 30   Ht 5\' 9"  (1.753 m)   Wt 105.4 kg   SpO2 97%   BMI 34.31 kg/m   - Neuro: alert NAD  - Cardiovascular: Sinus  Drips: , neo 13.   PAP: (24-30)/(14-19) 27/14  - Pulm: EWOB, clear Vent Mode: PSV;CPAP FiO2 (%):  [40 %] 40 % Set Rate:  [4 bmp] 4 bmp Vt Set:  [560 mL] 560 mL PEEP:  [5 cmH20] 5 cmH20 Pressure Support:  [10 cmH20] 10 cmH20  ABG    Component Value Date/Time   PHART 7.394 06/25/2020 1204   PCO2ART 28.5 (L) 06/25/2020 1204   PO2ART 94 06/25/2020 1204   HCO3 17.4 (L) 06/25/2020 1204   TCO2 18 (L) 06/25/2020 1204   ACIDBASEDEF 6.0 (H) 06/25/2020 1204   O2SAT 97.0 06/25/2020 1204    - Abd: soft - Extremity: warm  .Intake/Output      09/11 0701 - 09/12 0700 09/12 0701 - 09/13 0700   P.O. 480 360   I.V. (mL/kg) 1984.1 (18.8) 77.8 (0.7)   Blood     IV Piggyback 294.9    Total Intake(mL/kg) 2759 (26.2) 437.8 (4.2)   Urine (mL/kg/hr) 670 (0.3)    Emesis/NG output     Blood     Chest Tube 210    Total Output 880    Net +1879 +437.8           _______________________________________________________________ Labs: CBC Latest Ref Rng & Units 06/26/2020 06/25/2020 06/25/2020  WBC 4.0 - 10.5 K/uL 13.9(H) 12.7(H) -  Hemoglobin 13.0 - 17.0  g/dL 12.2(L) 12.3(L) 10.2(L)  Hematocrit 39 - 52 % 36.1(L) 36.5(L) 30.0(L)  Platelets 150 - 400 K/uL 248 215 -   CMP Latest Ref Rng & Units 06/26/2020 06/25/2020 06/25/2020  Glucose 70 - 99 mg/dL 08/25/2020) 517(O) -  BUN 6 - 20 mg/dL 10 11 -  Creatinine 160(V - 1.24 mg/dL 3.71 0.62 -  Sodium 6.94 - 145 mmol/L 136 137 143  Potassium 3.5 - 5.1 mmol/L 3.5 3.6 3.2(L)  Chloride 98 - 111 mmol/L 105 107 -  CO2 22 - 32 mmol/L 22 21(L) -  Calcium 8.9 - 10.3 mg/dL 8.0(L) 7.8(L) -  Total Protein 6.5 - 8.1 g/dL - - -  Total Bilirubin 0.3 - 1.2 mg/dL - - -  Alkaline Phos 38 - 126 U/L - - -  AST 15 - 41 U/L - - -  ALT 0 - 44 U/L - - -    CXR: Pending   _______________________________________________________________  Assessment and Plan: POD 2 s/p CABG,   Neuro: pain controlled CV: on A/S/BB.  On isordil for radial artery.  Wean pacing.  Wean neo Pulm: pulm  toilet Renal: creat stable.  Will watch uop GI: advancing diet Heme: stable ID: [email or web address] Endo: SSI today Dispo: continue ICU care  Brynda Greathouse, MD 06/26/2020 10:07 AM

## 2020-06-27 ENCOUNTER — Inpatient Hospital Stay (HOSPITAL_COMMUNITY): Payer: Self-pay

## 2020-06-27 DIAGNOSIS — Z951 Presence of aortocoronary bypass graft: Secondary | ICD-10-CM

## 2020-06-27 LAB — CBC
HCT: 32 % — ABNORMAL LOW (ref 39.0–52.0)
Hemoglobin: 10.7 g/dL — ABNORMAL LOW (ref 13.0–17.0)
MCH: 31.4 pg (ref 26.0–34.0)
MCHC: 33.4 g/dL (ref 30.0–36.0)
MCV: 93.8 fL (ref 80.0–100.0)
Platelets: 209 10*3/uL (ref 150–400)
RBC: 3.41 MIL/uL — ABNORMAL LOW (ref 4.22–5.81)
RDW: 13.9 % (ref 11.5–15.5)
WBC: 10.8 10*3/uL — ABNORMAL HIGH (ref 4.0–10.5)
nRBC: 0 % (ref 0.0–0.2)

## 2020-06-27 LAB — BASIC METABOLIC PANEL
Anion gap: 7 (ref 5–15)
BUN: 10 mg/dL (ref 6–20)
CO2: 25 mmol/L (ref 22–32)
Calcium: 7.8 mg/dL — ABNORMAL LOW (ref 8.9–10.3)
Chloride: 104 mmol/L (ref 98–111)
Creatinine, Ser: 0.82 mg/dL (ref 0.61–1.24)
GFR calc Af Amer: >60 mL/min (ref >60)
GFR calc non Af Amer: >60 mL/min (ref >60)
Glucose, Bld: 99 mg/dL (ref 70–99)
Potassium: 3.5 mmol/L (ref 3.5–5.1)
Sodium: 136 mmol/L (ref 135–145)

## 2020-06-27 LAB — GLUCOSE, CAPILLARY
Glucose-Capillary: 109 mg/dL — ABNORMAL HIGH (ref 70–99)
Glucose-Capillary: 121 mg/dL — ABNORMAL HIGH (ref 70–99)
Glucose-Capillary: 134 mg/dL — ABNORMAL HIGH (ref 70–99)
Glucose-Capillary: 142 mg/dL — ABNORMAL HIGH (ref 70–99)
Glucose-Capillary: 98 mg/dL (ref 70–99)

## 2020-06-27 MED ORDER — FUROSEMIDE 10 MG/ML IJ SOLN
40.0000 mg | Freq: Two times a day (BID) | INTRAMUSCULAR | Status: DC
  Administered 2020-06-27 – 2020-06-28 (×4): 40 mg via INTRAVENOUS
  Filled 2020-06-27 (×4): qty 4

## 2020-06-27 MED ORDER — POTASSIUM CHLORIDE CRYS ER 20 MEQ PO TBCR
20.0000 meq | EXTENDED_RELEASE_TABLET | ORAL | Status: AC
  Administered 2020-06-27 (×3): 20 meq via ORAL
  Filled 2020-06-27 (×3): qty 1

## 2020-06-27 MED ORDER — ASPIRIN EC 81 MG PO TBEC
81.0000 mg | DELAYED_RELEASE_TABLET | Freq: Every day | ORAL | Status: DC
  Administered 2020-06-27 – 2020-06-29 (×3): 81 mg via ORAL
  Filled 2020-06-27 (×3): qty 1

## 2020-06-27 MED ORDER — CLOPIDOGREL BISULFATE 75 MG PO TABS
75.0000 mg | ORAL_TABLET | Freq: Every day | ORAL | Status: DC
  Administered 2020-06-27 – 2020-06-29 (×3): 75 mg via ORAL
  Filled 2020-06-27 (×3): qty 1

## 2020-06-27 MED ORDER — AMIODARONE HCL 200 MG PO TABS
400.0000 mg | ORAL_TABLET | Freq: Two times a day (BID) | ORAL | Status: DC
  Administered 2020-06-27 (×2): 400 mg via ORAL
  Filled 2020-06-27 (×2): qty 2

## 2020-06-27 MED ORDER — COLCHICINE 0.3 MG HALF TABLET
0.3000 mg | ORAL_TABLET | Freq: Two times a day (BID) | ORAL | Status: DC
  Administered 2020-06-27 – 2020-06-29 (×5): 0.3 mg via ORAL
  Filled 2020-06-27 (×7): qty 1

## 2020-06-27 NOTE — Op Note (Signed)
CARDIOTHORACIC SURGERY OPERATIVE NOTE  Date of Procedure: 06/24/20 Preoperative Diagnosis: Severe 3-vessel Coronary Artery Disease, s/p NSTEMI  Postoperative Diagnosis: Same  Procedure:    Coronary Artery Bypass Grafting x 6  Left Internal Mammary Artery to Distal Left Anterior Descending Coronary Artery; pedicled RIMA Graft to Posterior Descending Coronary Artery; Saphenous Vein Graft to 2nd Obtuse Marginal Branch of Left Circumflex Coronary Artery; left radial artery graft to  Diagonal Branch Coronary Artery and 1st Obtuse Marginal Branch of Left Circumflex Coronary Arter as a sequenced graft; RSVG to RCA acute marginal branch. Endoscopic Vein Harvest from right Thigh and Lower Leg Bilateral IMA harvesting Open left radial artery harvesting Completion graft surveillance with indocyanine green fluorescence imaging (SPY)   Surgeon: B. Lorayne Marek, MD  Assistant: Jacques Earthly, PA-C  Anesthesia: get  Operative Findings:  Reduced left ventricular systolic function  Good quality  internal mammary artery conduits  good quality saphenous vein conduit  good quality radial artery conduit  good quality target vessels for grafting    BRIEF CLINICAL NOTE AND INDICATIONS FOR SURGERY  39 year old man is taken to the operating room for coronary artery bypass grafting.  His cardiac history dates back to 10 years ago when he had his first myocardial infarction at age 80.  He underwent PCI.  He has done well but was lost to follow-up 3 years ago.  He presented in last week with sudden onset chest pain and evidence of non-ST segment elevation MI.  Left heart cath demonstrated severely reduced LV ejection fraction and multivessel coronary artery disease.  He is considered an excellent candidate for surgery.   DETAILS OF THE OPERATIVE PROCEDURE  Preparation:  The patient is brought to the operating room on the above mentioned date and central monitoring was established by the anesthesia team  including placement of Swan-Ganz catheter and radial arterial line. The patient is placed in the supine position on the operating table.  Intravenous antibiotics are administered. General endotracheal anesthesia is induced uneventfully. A Foley catheter is placed.  Baseline transesophageal echocardiogram was performed.  Findings were notable for severely reduced LV function and no significant valvular disease  The patient's chest, abdomen, the left upper extremity both groins, and both lower extremities are prepared and draped in a sterile manner. A time out procedure is performed.   Surgical Approach and Conduit Harvest:  A median sternotomy incision was performed and the left internal mammary artery is dissected from the chest wall and prepared for bypass grafting. The left internal mammary artery is notably good quality conduit. Simultaneously, open left radial artery harvesting is performed.  Once the artery is removed the arm incision is closed in layers and retucked at the side.  Next attention is turned to the right hemithorax where the right internal mammary artery is pedicle mobilized in a standard fashion.  At the same time, the greater saphenous vein is obtained from the patient's right using endoscopic vein harvest technique. The saphenous vein is notably good quality conduit. After removal of the saphenous vein, the small surgical incisions in the lower extremity are closed with absorbable suture. Following systemic heparinization, the  mammary artery grafts were transected distally noted to have excellent flow.  They were treated with a solution of papaverine.  Multilevel rib block was undertaken by injecting Exparel solution into the visible rib spaces.  Extracorporeal Cardiopulmonary Bypass and Myocardial Protection:  The pericardium is opened. The ascending aorta is nondiseased in appearance. The ascending aorta and the right atrium are cannulated for  cardiopulmonary bypass.  Adequate  heparinization is verified. A retrograde cardioplegia cannula is placed through the right atrium into the coronary sinus.  The entire pre-bypass portion of the operation was notable for stable hemodynamics.  Cardiopulmonary bypass was begun and the surface of the heart is inspected. Distal target vessels are selected for coronary artery bypass grafting. A cardioplegia cannula is placed in the ascending aorta.  A temperature probe was placed in the interventricular septum.  The patient is allowed to cool passively to [facility] systemic temperature.  The aortic cross clamp is applied and cold blood cardioplegia is delivered initially in an antegrade fashion through the aortic root.Supplemental cardioplegia is given retrograde through the coronary sinus catheter.  Iced saline slush is applied for topical hypothermia.  The initial cardioplegic arrest is rapid with early diastolic arrest.  Repeat doses of cardioplegia are administered intermittently throughout the entire cross clamp portion of the operation through the aortic root, through the coronary sinus catheter, and through subsequently placed vein grafts in order to maintain completely flat electrocardiogram.   Coronary Artery Bypass Grafting:   The  posterior descending branch of the right coronary artery was grafted using the pedicled right internal mammary artery graft in an end-to-side fashion.  At the site of distal anastomosis the target vessel was good quality and measured approximately 1.5 mm in diameter. Anastomotic patency and runoff was confirmed with indocyanine green fluorescence imaging (SPY).  The second obtuse marginal branch of the left circumflex coronary artery was grafted using a reversed saphenous vein graft in an end-to-side fashion.  At the site of distal anastomosis the target vessel was good quality and measured approximately 1.5 mm in diameter.  The first diagonal branch of the left anterior descending coronary artery and the  first obtuse marginal branch were grafted using the left radial artery graft in a sequenced fashion.  At the site of distal anastomoses the target vessels were quality and measured approximately 1.5 mm in diameter.  The acute marginal branch of the right coronary artery was grafted in and end-to-side fashion saphenous vein graft.   At the site of distal anastomosis the target vessel was good quality and measured approximately 1.5 mm in diameter.  The distal left anterior coronary artery was grafted with the left internal mammary artery in an end-to-side fashion.  At the site of distal anastomosis the target vessel was good quality and measured approximately 1.5 mm in diameter. Anastomotic patency and runoff was confirmed with indocyanine green fluorescence imaging (SPY).  All proximal graft anastomoses were placed directly to the ascending aorta prior to removal of the aortic cross clamp.  Deairing procedures were performed and the aortic cross clamp was removed.    Procedure Completion:  All proximal and distal coronary anastomoses were inspected for hemostasis and appropriate graft orientation. Epicardial pacing wires are fixed to the right ventricular outflow tract and to the right atrial appendage. The patient is rewarmed to 37C temperature. The patient is weaned and disconnected from cardiopulmonary bypass.  The patient's rhythm at separation from bypass was sinus bradycardia.  The patient was weaned from cardiopulmonary bypass without signigicant any inotropic support.  Followup transesophageal echocardiogram performed after separation from bypass revealed  Improved LV function compared with the preoperative exam.  The aortic and venous cannula were removed uneventfully. Protamine was administered to reverse the anticoagulation. The mediastinum and pleural space were inspected for hemostasis and irrigated with saline solution. The mediastinum and both pleural spaces were drained using fluted  chest tubes placed  through separate stab incisions inferiorly.  The soft tissues anterior to the aorta were reapproximated loosely. The sternum is closed with double strength sternal wire. The soft tissues anterior to the sternum were closed in multiple layers and the skin is closed with a running subcuticular skin closure.  The post-bypass portion of the operation was notable for stable rhythm and hemodynamics.  No blood products were administered during the operation.   Disposition:  The patient tolerated the procedure well and is transported to the surgical intensive care in stable condition. There are no intraoperative complications. All sponge instrument and needle counts are verified correct at completion of the operation.    Brantley Fling, MD 06/27/2020 9:07 AM

## 2020-06-27 NOTE — Plan of Care (Signed)
Pt needs lots of encouragement to walk and get oob. Ambulated 160 feet this am, had to stop 2 times due to surgical pain in patients chest. Pt able to maintain oxygen sats initially, but did require 2l [place] to finish walk as his sats were 84 on ra. Educated on need to get oob and walk 3 times a day. Pt has not used IS in front of this nurse, keeps saying i'll do it in a minute. Pt states  he has been using IS. Continue to monitor.Melodye Ped

## 2020-06-27 NOTE — Progress Notes (Signed)
3 Days Post-Op Procedure(s) (LRB): CORONARY ARTERY BYPASS GRAFTING (CABG) x 6, bilateral IMA, LIMA TO LAD, RIMA TO PDA, LEFT RADIAL ARTERY TO DIAG. 1 SEQ TO OM1, SVG TO OM2, SVG TO ACUTE MARGINAL (N/A) RADIAL ARTERY HARVEST (Left) possible, PLACEMENT OF IMPELLA LEFT VENTRICULAR ASSIST DEVICE 5.5 (N/A) TRANSESOPHAGEAL ECHOCARDIOGRAM (TEE) (N/A) INDOCYANINE GREEN FLUORESCENCE IMAGING (ICG) (N/A) ENDOVEIN HARVEST OF GREATER SAPHENOUS VEIN (Right) Subjective: A little sore  Objective: Vital signs in last 24 hours: Temp:  [98.1 F (36.7 C)-98.7 F (37.1 C)] 98.3 F (36.8 C) (09/13 0729) Pulse Rate:  [58-87] 80 (09/13 0700) Cardiac Rhythm: Normal sinus rhythm (09/12 2000) Resp:  [15-36] 34 (09/13 0700) BP: (80-124)/(50-82) 102/62 (09/13 0600) SpO2:  [91 %-97 %] 97 % (09/13 0200) Weight:  [105.4 kg] 105.4 kg (09/13 0700)  Hemodynamic parameters for last 24 hours:    Intake/Output from previous day: 09/12 0701 - 09/13 0700 In: 2092 [P.O.:960; I.V.:1132] Out: 1450 [Urine:1350; Chest Tube:100] Intake/Output this shift: No intake/output data recorded.  General appearance: alert and cooperative Neurologic: intact Heart: regular rate and rhythm, S1, S2 normal, no murmur, click, rub or gallop Lungs: clear to auscultation bilaterally Abdomen: soft, non-tender; bowel sounds normal; no masses,  no organomegaly Extremities: extremities normal, atraumatic, no cyanosis or edema  Lab Results: Recent Labs    06/26/20 0321 06/27/20 0359  WBC 13.9* 10.8*  HGB 12.2* 10.7*  HCT 36.1* 32.0*  PLT 248 209   BMET:  Recent Labs    06/26/20 0321 06/27/20 0359  NA 136 136  K 3.5 3.5  CL 105 104  CO2 22 25  GLUCOSE 113* 99  BUN 10 10  CREATININE 0.85 0.82  CALCIUM 8.0* 7.8*    PT/INR:  Recent Labs    06/24/20 1443  LABPROT 15.9*  INR 1.3*   ABG    Component Value Date/Time   PHART 7.394 06/25/2020 1204   HCO3 17.4 (L) 06/25/2020 1204   TCO2 18 (L) 06/25/2020 1204    ACIDBASEDEF 6.0 (H) 06/25/2020 1204   O2SAT 97.0 06/25/2020 1204   CBG (last 3)  Recent Labs    06/26/20 2357 06/27/20 0359 06/27/20 0644  GLUCAP 125* 142* 134*    Assessment/Plan: S/P Procedure(s) (LRB): CORONARY ARTERY BYPASS GRAFTING (CABG) x 6, bilateral IMA, LIMA TO LAD, RIMA TO PDA, LEFT RADIAL ARTERY TO DIAG. 1 SEQ TO OM1, SVG TO OM2, SVG TO ACUTE MARGINAL (N/A) RADIAL ARTERY HARVEST (Left) possible, PLACEMENT OF IMPELLA LEFT VENTRICULAR ASSIST DEVICE 5.5 (N/A) TRANSESOPHAGEAL ECHOCARDIOGRAM (TEE) (N/A) INDOCYANINE GREEN FLUORESCENCE IMAGING (ICG) (N/A) ENDOVEIN HARVEST OF GREATER SAPHENOUS VEIN (Right) Mobilize Diuresis See progression orders   LOS: 4 days    [name] 06/27/2020

## 2020-06-27 NOTE — Anesthesia Postprocedure Evaluation (Signed)
Anesthesia Post Note  Patient: [name]  Procedure(s) Performed: CORONARY ARTERY BYPASS GRAFTING (CABG) x 6, bilateral IMA, LIMA TO LAD, RIMA TO PDA, LEFT RADIAL ARTERY TO DIAG. 1 SEQ TO OM1, SVG TO OM2, SVG TO ACUTE MARGINAL (N/A Chest) RADIAL ARTERY HARVEST (Left Arm Lower) possible, PLACEMENT OF IMPELLA LEFT VENTRICULAR ASSIST DEVICE 5.5 (N/A ) TRANSESOPHAGEAL ECHOCARDIOGRAM (TEE) (N/A ) INDOCYANINE GREEN FLUORESCENCE IMAGING (ICG) (N/A ) ENDOVEIN HARVEST OF GREATER SAPHENOUS VEIN (Right )     Patient location during evaluation: SICU Anesthesia Type: General Level of consciousness: sedated Pain management: pain level controlled Vital Signs Assessment: post-procedure vital signs reviewed and stable Respiratory status: patient remains intubated per anesthesia plan Cardiovascular status: stable Postop Assessment: no apparent nausea or vomiting Anesthetic complications: no   No complications documented.  Last Vitals:  Vitals:   06/27/20 0800 06/27/20 0900  BP:  (!) 105/53  Pulse:  75  Resp:    Temp:    SpO2: 91% 91%    Last Pain:  Vitals:   06/27/20 0858  TempSrc:   PainSc: 6                  Shalisha Clausing

## 2020-06-27 NOTE — Progress Notes (Signed)
Progress Note  Patient Name: [name] Date of Encounter: 06/27/2020  [facility] Cardiologist: Kirk Ruths, MD   Subjective   In bed, sore from surgery  Inpatient Medications    Scheduled Meds: . acetaminophen  1,000 mg Oral Q6H   Or  . acetaminophen (TYLENOL) oral liquid 160 mg/5 mL  1,000 mg Per Tube Q6H  . amiodarone  400 mg Oral BID  . aspirin EC  81 mg Oral Daily  . bisacodyl  10 mg Oral Daily   Or  . bisacodyl  10 mg Rectal Daily  . chlorhexidine gluconate (MEDLINE KIT)  15 mL Mouth Rinse BID  . Chlorhexidine Gluconate Cloth  6 each Topical Daily  . clopidogrel  75 mg Oral Daily  . colchicine  0.3 mg Oral BID  . docusate sodium  200 mg Oral Daily  . enoxaparin (LOVENOX) injection  40 mg Subcutaneous QHS  . furosemide  40 mg Intravenous BID  . insulin aspart  0-24 Units Subcutaneous Q4H  . isosorbide dinitrate  10 mg Oral TID  . pantoprazole  40 mg Oral Daily  . potassium chloride  20 mEq Oral Q4H  . rosuvastatin  40 mg Oral Daily  . sodium chloride flush  3 mL Intravenous Q12H   Continuous Infusions: . sodium chloride    . insulin Stopped (06/24/20 2251)  . lactated ringers     PRN Meds: dextrose, lactated ringers, metoprolol tartrate, ondansetron (ZOFRAN) IV, oxyCODONE, sodium chloride flush, sodium chloride flush, traMADol   Vital Signs    Vitals:   06/27/20 0700 06/27/20 0729 06/27/20 0800 06/27/20 0900  BP:    (!) 105/53  Pulse: 80   75  Resp: (!) 34     Temp:  98.3 F (36.8 C)    TempSrc:  Oral    SpO2:   91% 91%  Weight: 105.4 kg     Height:        Intake/Output Summary (Last 24 hours) at 06/27/2020 0957 Last data filed at 06/27/2020 0800 Gross per 24 hour  Intake 2050.87 ml  Output 1450 ml  Net 600.87 ml   Last 3 Weights 06/27/2020 06/26/2020 06/23/2020  Weight (lbs) 232 lb 5.8 oz 232 lb 5.8 oz 230 lb  Weight (kg) 105.4 kg 105.4 kg 104.327 kg      Telemetry    No adverse arrhythmias- Personally Reviewed  ECG    No new-  Personally Reviewed  Physical Exam   GEN: No acute distress.   Neck: No JVD Cardiac: RRR, no murmurs, rubs, or gallops.  Scar dressed Respiratory: Clear to auscultation bilaterally. GI: Soft, nontender, non-distended  MS:  Mild lower extremity edema; No deformity. Neuro:  Nonfocal  Psych: Normal affect   Labs    High Sensitivity Troponin:   Recent Labs  Lab 06/23/20 0400 06/23/20 0552 06/23/20 1330 06/23/20 2047  TROPONINIHS 771* 2,292* 21,634* >27,000*      Chemistry Recent Labs  Lab 06/23/20 1330 06/24/20 0440 06/24/20 2117 06/25/20 0442 06/25/20 1655 06/26/20 0321 06/27/20 0359  NA 136   < > 137   < > 137 136 136  K 4.0   < > 3.9   < > 3.6 3.5 3.5  CL 104   < > 108   < > 107 105 104  CO2 21*   < > 22   < > 21* 22 25  GLUCOSE 97   < > 114*   < > 120* 113* 99  BUN 7   < >  10   < > $R'11 10 10  'Eo$ CREATININE 0.84   < > 0.93   < > 0.91 0.85 0.82  CALCIUM 8.4*   < > 7.3*   < > 7.8* 8.0* 7.8*  PROT 6.1*  --  5.0*  --   --   --   --   ALBUMIN 3.4*  --  3.0*  --   --   --   --   AST 179*  --  159*  --   --   --   --   ALT 47*  --  47*  --   --   --   --   ALKPHOS 45  --  35*  --   --   --   --   BILITOT 1.0  --  0.8  --   --   --   --   GFRNONAA >60   < > >60   < > >60 >60 >60  GFRAA >60   < > >60   < > >60 >60 >60  ANIONGAP 11   < > 7   < > $R'9 9 7   'Ze$ < > = values in this interval not displayed.     Hematology Recent Labs  Lab 06/25/20 1655 06/26/20 0321 06/27/20 0359  WBC 12.7* 13.9* 10.8*  RBC 3.97* 3.91* 3.41*  HGB 12.3* 12.2* 10.7*  HCT 36.5* 36.1* 32.0*  MCV 91.9 92.3 93.8  MCH 31.0 31.2 31.4  MCHC 33.7 33.8 33.4  RDW 13.4 13.8 13.9  PLT 215 248 209    BNPNo results for input(s): BNP, PROBNP in the last 168 hours.   DDimer No results for input(s): DDIMER in the last 168 hours.   Radiology    DG Chest Port 1 View  Result Date: 06/27/2020 CLINICAL DATA:  Pleural effusion, open heart surgery EXAM: PORTABLE CHEST 1 VIEW COMPARISON:  06/25/2020  FINDINGS: Interval extubation, removal of enteric tube, removal right IJ Swan-Ganz catheter, removal intra-aortic balloon pump, and removal of bilateral chest tubes/mediastinal drains. Stable right IJ sheath. Postsurgical changes related to prior CABG. Status post median sternotomy. Lungs are essentially clear. No pneumothorax. No pleural effusion is seen. Mild cardiomegaly. IMPRESSION: Interval extubation and removal of multiple support apparatus as above. No pneumothorax is seen. Electronically Signed   By: Julian Hy M.D.   On: 06/27/2020 08:15    Cardiac Studies   Echocardiogram 06/23/2020   1. Left ventricular ejection fraction, by estimation, is 35 to 40%. The  left ventricle has moderately decreased function. The left ventricle  demonstrates regional wall motion abnormalities (see scoring  diagram/findings for description). Left ventricular  diastolic parameters are indeterminate. There is akinesis of the left  ventricular, entire anteroseptal wall. There is moderate hypokinesis of  the left ventricular, entire inferoseptal wall, anterior wall and apical  segment. There is severe hypokinesis  of the left ventricular, mid-apical anterior wall and inferior wall.  2. Right ventricular systolic function is mildly reduced. The right  ventricular size is normal. Tricuspid regurgitation signal is inadequate  for assessing PA pressure.  3. The mitral valve is normal in structure. Mild mitral valve  regurgitation. No evidence of mitral stenosis.  4. The aortic valve is tricuspid. Aortic valve regurgitation is not  visualized. No aortic stenosis is present.  5. Aortic presence of intra-aortic balloon pump.  6. The inferior vena cava is normal in size with <50% respiratory  variability, suggesting right atrial pressure of 8 mmHg.   Patient Profile  39 y.o. male admitted with non-ST elevation myocardial infarction revealing severe triple-vessel coronary artery disease-CABG,  Dr. Orvan Seen.  Assessment & Plan    CAD post CABG in the setting of non-STEMI Aggressive early coronary artery disease. Prior inferior myocardial infarction 2011 Tobacco use Ischemic cardiomyopathy-EF 35 to 40% -Continuing to mobilize. -High intensity statin, aspirin, beta-blocker as tolerated -Tobacco cessation discussed. -Has had trouble with statin myalgias in the past. -Cardiac rehab -Blood pressure currently does not allow for Entresto/ARB/beta-blocker. -Continue with IV Lasix -On isosorbide for radial graft       For questions or updates, please contact [facility] Please consult [email or web address] for contact info under        Signed, Candee Furbish, MD  06/27/2020, 9:57 AM

## 2020-06-27 NOTE — Progress Notes (Signed)
CARDIAC REHAB PHASE I   Offered to walk with pt. Pt states he is tired from getting washed up and is about to transfer rooms. Spoke about importance of sitting in chair, walks, and IS use. Provided support and encouragement. Will continue to follow.  [id number] Reynold Bowen, RN BSN 06/27/2020 1:00 PM

## 2020-06-28 ENCOUNTER — Inpatient Hospital Stay (HOSPITAL_COMMUNITY): Payer: Self-pay

## 2020-06-28 ENCOUNTER — Encounter (HOSPITAL_COMMUNITY): Payer: Self-pay | Admitting: Cardiothoracic Surgery

## 2020-06-28 DIAGNOSIS — I5021 Acute systolic (congestive) heart failure: Secondary | ICD-10-CM | POA: Diagnosis present

## 2020-06-28 DIAGNOSIS — I255 Ischemic cardiomyopathy: Secondary | ICD-10-CM | POA: Diagnosis present

## 2020-06-28 LAB — CBC
HCT: 32.3 % — ABNORMAL LOW (ref 39.0–52.0)
Hemoglobin: 10.7 g/dL — ABNORMAL LOW (ref 13.0–17.0)
MCH: 30.2 pg (ref 26.0–34.0)
MCHC: 33.1 g/dL (ref 30.0–36.0)
MCV: 91.2 fL (ref 80.0–100.0)
Platelets: 252 10*3/uL (ref 150–400)
RBC: 3.54 MIL/uL — ABNORMAL LOW (ref 4.22–5.81)
RDW: 13.6 % (ref 11.5–15.5)
WBC: 9.6 10*3/uL (ref 4.0–10.5)
nRBC: 0 % (ref 0.0–0.2)

## 2020-06-28 LAB — BASIC METABOLIC PANEL
Anion gap: 10 (ref 5–15)
BUN: 12 mg/dL (ref 6–20)
CO2: 26 mmol/L (ref 22–32)
Calcium: 8 mg/dL — ABNORMAL LOW (ref 8.9–10.3)
Chloride: 100 mmol/L (ref 98–111)
Creatinine, Ser: 1.07 mg/dL (ref 0.61–1.24)
GFR calc Af Amer: >60 mL/min (ref >60)
GFR calc non Af Amer: >60 mL/min (ref >60)
Glucose, Bld: 100 mg/dL — ABNORMAL HIGH (ref 70–99)
Potassium: 4.1 mmol/L (ref 3.5–5.1)
Sodium: 136 mmol/L (ref 135–145)

## 2020-06-28 LAB — ECHOCARDIOGRAM LIMITED
Area-P 1/2: 3.85 cm2
Height: 69 in
S' Lateral: 3.4 cm
Single Plane A4C EF: 36.3 %
Weight: 3615.54 oz

## 2020-06-28 LAB — GLUCOSE, CAPILLARY: Glucose-Capillary: 94 mg/dL (ref 70–99)

## 2020-06-28 MED ORDER — AMIODARONE HCL 200 MG PO TABS
200.0000 mg | ORAL_TABLET | Freq: Two times a day (BID) | ORAL | Status: DC
  Administered 2020-06-28 – 2020-06-29 (×3): 200 mg via ORAL
  Filled 2020-06-28 (×3): qty 1

## 2020-06-28 MED ORDER — POTASSIUM CHLORIDE CRYS ER 20 MEQ PO TBCR
20.0000 meq | EXTENDED_RELEASE_TABLET | Freq: Every day | ORAL | Status: DC
  Administered 2020-06-28: 20 meq via ORAL
  Filled 2020-06-28: qty 1

## 2020-06-28 MED ORDER — FUROSEMIDE 40 MG PO TABS: 40.0000 mg | ORAL_TABLET | Freq: Every day | ORAL | Status: DC

## 2020-06-28 MED ORDER — ASPIRIN 81 MG PO TBEC
81.0000 mg | DELAYED_RELEASE_TABLET | Freq: Every day | ORAL | 11 refills | Status: DC
Start: 2020-06-29 — End: 2021-02-16

## 2020-06-28 NOTE — Progress Notes (Addendum)
      [address]       [place] [id number]             [phone]        4 Days Post-Op Procedure(s) (LRB): CORONARY ARTERY BYPASS GRAFTING (CABG) x 6, bilateral IMA, LIMA TO LAD, RIMA TO PDA, LEFT RADIAL ARTERY TO DIAG. 1 SEQ TO OM1, SVG TO OM2, SVG TO ACUTE MARGINAL (N/A) RADIAL ARTERY HARVEST (Left) possible, PLACEMENT OF IMPELLA LEFT VENTRICULAR ASSIST DEVICE 5.5 (N/A) TRANSESOPHAGEAL ECHOCARDIOGRAM (TEE) (N/A) INDOCYANINE GREEN FLUORESCENCE IMAGING (ICG) (N/A) ENDOVEIN HARVEST OF GREATER SAPHENOUS VEIN (Right)  Subjective: Patient about to eat breakfast. He has already had a bowel movement. He has numbness in left thumb, 3rd and 4th fingers but motor intact  Objective: Vital signs in last 24 hours: Temp:  [98.2 F (36.8 C)-99.1 F (37.3 C)] 98.2 F (36.8 C) (09/14 0400) Pulse Rate:  [64-92] 64 (09/14 0400) Cardiac Rhythm: Normal sinus rhythm (09/13 1900) Resp:  [18-27] 18 (09/14 0200) BP: (85-141)/(53-78) 100/59 (09/14 0200) SpO2:  [87 %-99 %] 99 % (09/14 0400) Weight:  [102.5 kg] 102.5 kg (09/14 0400)  Pre op weight 104.3 kg Current Weight  06/28/20 102.5 kg       Intake/Output from previous day: 09/13 0701 - 09/14 0700 In: 516.7 [P.O.:480; I.V.:36.7] Out: 3025 [Urine:3025]   Physical Exam:  Cardiovascular: RRR Pulmonary: Clear to auscultation bilaterally Abdomen: Soft, non tender, bowel sounds present. Extremities: Mild bilateral lower extremity edema. Motor intact but numbness left thumb, 3rd and 4th fingers Wounds: Sternal, LUE, and RLE wounds are clean and dry.  No erythema or signs of infection.  Lab Results: CBC: Recent Labs    06/27/20 0359 06/28/20 0032  WBC 10.8* 9.6  HGB 10.7* 10.7*  HCT 32.0* 32.3*  PLT 209 252   BMET:  Recent Labs    06/27/20 0359 06/28/20 0032  NA 136 136  K 3.5 4.1  CL 104 100  CO2 25 26  GLUCOSE 99 100*  BUN 10 12  CREATININE 0.82 1.07  CALCIUM 7.8* 8.0*    PT/INR:  Lab Results   Component Value Date   INR 1.3 (H) 06/24/2020   INR 0.89 11/30/2010   ABG:  INR: Will add last result for INR, ABG once components are confirmed Will add last 4 CBG results once components are confirmed  Assessment/Plan:  1. CV - SR with HR in the 70's. On Plavix 75 mg daily, baby ec asa, Colchine 0.3 mg bid, Isordil 10 mg tid. 2.  Pulmonary - On room air. CXR this am appears stable (cardiomegaly, no pneumtohroax). Encourage incentive spirometer. 3. Volume Overload - Will give Lasix this am 4.  Expected post op acute blood loss anemia - H and H this am stable at 10.7 and 32.3 5. CBGs 121/109/94. Pre op HGA1C 5.2. Stop accu checks and SS PRN 6. Remove EPW 7. Hopefully, home in am  Donielle M ZimmermanPA-C 06/28/2020,7:19 AM  Pt seen and examined; films reviewed. Agree with documentation. May be able to discharge today, pending echo. Med regimen appears stable/appropriate.  Fawzi Melman Z. Vickey Sages, MD [phone]

## 2020-06-28 NOTE — Discharge Summary (Addendum)
Physician Discharge Summary       [address] [place].[address]       [place] [address]             [phone]    Patient ID: [name] MRN: [id number] DOB/AGE: 40-Apr-1982 40 y.o.  Admit date: 06/23/2020 Discharge date: 06/29/2020  Admission Diagnoses: 1. S/p NSTEMI (non-ST elevated myocardial infarction) (HCC) 2. Ischemic cardiomyopathy 3.  Coronary artery disease  Discharge Diagnoses:  1. S/P CABG x 6 2. 3. Acute systolic heart failure (HCC) 4. 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 History of hyperlipidemia 6. History of hypertension 7. History of nephrolithiasis 8. 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 Endoscopic Vein Harvest from right Thigh and Lower Leg Bilateral IMA harvesting Open left radial artery harvesting Completion graft surveillance with indocyanine green fluorescence imaging (SPY) by Dr. Vickey Sages on 06/24/2020.   History of Presenting Illness: This is a 39 year old man status post PCI 10 years ago who has been lost to follow-up recently.  He experienced sudden onset of chest pain with radiation to his left arm last 06/22/2020.  He presented to the [facility] emergency department overnight where he was diagnosed with NSTEMI.  He was transferred to [facility] for cardiac catheterization on  06/23/2020. Results showed severe multivessel coronary artery disease,including a  total LAD.  He has depressed ejection fraction but remains hemodynamically stable.  On the table ,his pain which was 10 out of 10 is now 2 out of 10.  IABP was placed.  Echo showed LVEF 35-40%,  regional wall motion abnormalities, and no significant valvular abnormalities. Patient has continued to work up until this point. Consultation is received for cardiac surgery.  Dr. Vickey Sages discussed the need for coronary artery bypass grafting surgery. Potential risks, benefits, and complications of there surgery were discussed with the patient and he agreed to proceed with surgery. Pre operative carotid duplex US showed no significant internal carotid artery stenosis bilaterally. He underwent a CABG x 6 by Dr. Vickey Sages on 06/24/2020.  Brief Hospital Course:   Patient was extubated early afternoon on post operative day one without difficulty. He remained afebrile and hemodynamically stable. He was weaned off Neo Synephrine and Nitro drips. Theone Murdoch, a line, and foley were removed early in his post operative course. Chest tubes remained for a few days and once output decreased, they were removed. He was started on Isordil (for radial artery harvest). He was not started on Lopressor secondary to bradycardia. He was volume overloaded and diuresed. He was weaned off the Insulin drip. His HGA1C was 5.2. Accu checks and SS were stopped as glucose remained well controlled. He was started on an Amiodarone drip (for prophylaxis) and transitioned to oral Amiodarone as he maintained sinus rhythm. He was felt surgically stable for transfer to the progressive care floor 2C for further convalescence on 09/13. He has been ambulating on room air. He has been  tolerating a diet and has had a bowel movement. His sternal, LUE and RLE wounds are clean, dry, and healing without sign of infection. He did have numbness of left thumb and 3rd and 4th fingers but motor intact. Epicardial pacing wires were removed on 09/14. His HR has been in the  70-80's and BP not as labile as previously but will likely start Lopresor after discharge. Chest tube sutures will be removed the day of discharge. As discussed with Dr. Vickey Sages, the patient is felt surgically stable for discharge today.  Latest Vital Signs: Blood pressure 122/70, pulse 76, temperature 98.2 F (36.8 C), temperature source Oral, resp. rate 17, height 5\' 9"  (1.753 m), weight 99.5 kg, SpO2 98 %.  Physical Exam: Cardiovascular: RRR Pulmonary: Clear to auscultation bilaterally Abdomen: Soft, non tender, bowel sounds present. Extremities: Mild bilateral lower extremity edema. Motor intact but numbness left thumb, 3rd and 4th fingers Wounds: Sternal, LUE, and RLE wounds are clean and dry.  No erythema or signs of infection.  Discharge Condition:Stable and discharged to home.  Recent laboratory studies:  Lab Results  Component Value Date   WBC 9.6 06/28/2020   HGB 10.7 (L) 06/28/2020   HCT 32.3 (L) 06/28/2020   MCV 91.2 06/28/2020   PLT 252 06/28/2020   Lab Results  Component Value Date   NA 136 06/29/2020   K 3.7 06/29/2020   CL 99 06/29/2020   CO2 29 06/29/2020   CREATININE 1.07 06/29/2020   GLUCOSE 106 (H) 06/29/2020     Diagnostic Studies: DG Chest 2 View  Result Date: 06/28/2020 CLINICAL DATA:  Shortness of breath, chest pain EXAM: CHEST - 2 VIEW COMPARISON:  06/27/2020 FINDINGS: Prior median sternotomy. Cardiomegaly. Left base atelectasis. No effusions or pneumothorax. IMPRESSION: Left base atelectasis.  Improved aeration since prior study. Electronically Signed   By: Charlett Nose M.D.   On: 06/28/2020 08:40   CARDIAC CATHETERIZATION  Result Date: 06/23/2020 Conclusions: 1. Severe three-vessel coronary artery disease, including occluded proximal/mid LAD stent with minimal collateral filling of the mid/distal LAD, sequential 90% and 70% mid/distal LCx lesions, and chronic total occlusion of mid RCA stent with bridging and left-to-right collaterals. 2. Severely  reduced left ventricular systolic function (LVEF 25-30%) with moderately elevated filling pressure (LVEDP ~30 mmHg). 3. Successful placement of 50 mL IABP via the right common femoral artery. Recommendations: 1. Transfer to 2H-ICU for continued medical therapy, including diuresis.  Images reviewed in person with Dr. Vickey Sages with tentative plans for CABG tomorrow. 2. Restart heparin infusion in 2 hours. 3. Initiate furosemide 40 mg IV BID. 4. Start carvedilol 3.125 mg BID.  Defer adding ACEI/ARB until after surgery. 5. Aggressive secondary prevention, including high-intensity statin therapy and tobacco cessation. 6. Obtain echocardiogram. Yvonne Kendall, MD [facility]   DG Chest Port 1 View  Result Date: 06/27/2020 CLINICAL DATA:  Pleural effusion, open heart surgery EXAM: PORTABLE CHEST 1 VIEW COMPARISON:  06/25/2020 FINDINGS: Interval extubation, removal of enteric tube, removal right IJ Swan-Ganz catheter, removal intra-aortic balloon pump, and removal of bilateral chest tubes/mediastinal drains. Stable right IJ sheath. Postsurgical changes related to prior CABG. Status post median sternotomy. Lungs are essentially clear. No pneumothorax. No pleural effusion is seen. Mild cardiomegaly. IMPRESSION: Interval extubation and removal of multiple support apparatus as above. No pneumothorax is seen. Electronically Signed   By: Charline Bills M.D.   On: 06/27/2020 08:15   DG Chest Port 1 View  Result Date: 06/25/2020 CLINICAL DATA:  Reason for exam: Pneumothorax EXAM: PORTABLE CHEST  1 VIEW COMPARISON:  06/24/2020 FINDINGS: Endotracheal tube, enteric tube, Swan-Ganz catheter, mediastinal drain, and LEFT chest tube unchanged. Aortic balloon pump has been advanced with the tip is at the level of the LEFT clavicle/transverse aortic arch. No pneumothorax. Stable cardiac silhouette. Lungs are clear. IMPRESSION: Stable support apparatus. Lungs are clear. No pneumothorax. Electronically Signed   By: Genevive Bi M.D.   On: 06/25/2020 09:11   DG Chest Port 1 View  Result Date: 06/24/2020 CLINICAL DATA:  39 year old male status post CABG postoperative day zero. EXAM: PORTABLE CHEST 1 VIEW COMPARISON:  06/23/2020 and earlier. FINDINGS: Portable AP supine view at 1429 hours. Endotracheal tube tip in good position just below the clavicles. Enteric tube courses to the abdomen, tip not included. Right IJ approach Swan-Ganz catheter, tip at the right main pulmonary artery level. Bilateral chest tubes and mediastinal tube in place. Intra-aortic balloon pump marker remains at the level of the descending thoracic aorta. Lower lung volumes. No pneumothorax, pulmonary edema or pleural effusion identified. Mild perihilar atelectasis. Stable cardiac size and mediastinal contours. Paucity of bowel gas in the upper abdomen. IMPRESSION: 1. Lines and tubes appear appropriately placed. Intra-aortic balloon pump marker in the descending thoracic aorta. 2. Atelectasis.  No pneumothorax or pulmonary edema. Electronically Signed   By: Odessa Fleming M.D.   On: 06/24/2020 14:39   DG CHEST PORT 1 VIEW  Result Date: 06/23/2020 CLINICAL DATA:  Chest pain EXAM: PORTABLE CHEST 1 VIEW COMPARISON:  Film from earlier in the same day. FINDINGS: Intra-aortic balloon pump is now been placed with the tip over the aortic knob. Cardiac shadow is at the upper limits of normal but accentuated by the portable technique. Lungs are well aerated without focal infiltrate or sizable effusion. No bony abnormality is noted. IMPRESSION: Intra-aortic balloon pump in adequate position. No other focal abnormality is noted. Electronically Signed   By: Alcide Clever M.D.   On: 06/23/2020 12:17   DG Chest Port 1 View  Result Date: 06/23/2020 CLINICAL DATA:  Chest pain radiating to the left arm and back. Shortness of breath and nausea. Current smoker. EXAM: PORTABLE CHEST 1 VIEW COMPARISON:  07/24/2018 FINDINGS: The heart size and mediastinal contours are within normal  limits. Both lungs are clear. The visualized skeletal structures are unremarkable. IMPRESSION: No active disease. Electronically Signed   By: Burman Nieves M.D.   On: 06/23/2020 01:26   ECHOCARDIOGRAM COMPLETE  Result Date: 06/23/2020    ECHOCARDIOGRAM REPORT   Patient Name:   [name] Date of Exam: 06/23/2020 Medical Rec #:  [id number]      Height:       69.0 in Accession #:    [id number]     Weight:       230.0 lb Date of Birth:  17-May-1981     BSA:          2.192 m Patient Age:    38 years       BP:           140/56 mmHg Patient Gender: M              HR:           95 bpm. Exam Location:  Inpatient Procedure: 2D Echo, Cardiac Doppler and Color Doppler Indications:    Z01.818 Encounter for other preprocedural examination  History:        Patient has no prior history of Echocardiogram examinations.  CAD; Risk Factors:Hypertension and Dyslipidemia.  Sonographer:    Elmarie Shiley Dance Referring Phys: [id number] QMVHQIO Z ATKINS IMPRESSIONS  1. Left ventricular ejection fraction, by estimation, is 35 to 40%. The left ventricle has moderately decreased function. The left ventricle demonstrates regional wall motion abnormalities (see scoring diagram/findings for description). Left ventricular  diastolic parameters are indeterminate. There is akinesis of the left ventricular, entire anteroseptal wall. There is moderate hypokinesis of the left ventricular, entire inferoseptal wall, anterior wall and apical segment. There is severe hypokinesis of the left ventricular, mid-apical anterior wall and inferior wall.  2. Right ventricular systolic function is mildly reduced. The right ventricular size is normal. Tricuspid regurgitation signal is inadequate for assessing PA pressure.  3. The mitral valve is normal in structure. Mild mitral valve regurgitation. No evidence of mitral stenosis.  4. The aortic valve is tricuspid. Aortic valve regurgitation is not visualized. No aortic stenosis is present.  5. Aortic  presence of intra-aortic balloon pump.  6. The inferior vena cava is normal in size with <50% respiratory variability, suggesting right atrial pressure of 8 mmHg. Comparison(s): No prior Echocardiogram. FINDINGS  Left Ventricle: Left ventricular ejection fraction, by estimation, is 35 to 40%. The left ventricle has moderately decreased function. The left ventricle demonstrates regional wall motion abnormalities. Moderate hypokinesis of the left ventricular, entire inferoseptal wall, anterior wall and apical segment. Severe hypokinesis of the left ventricular, mid-apical anterior wall and inferior wall. The left ventricular internal cavity size was normal in size. There is no left ventricular hypertrophy. Left ventricular diastolic parameters are indeterminate. Right Ventricle: The right ventricular size is normal. No increase in right ventricular wall thickness. Right ventricular systolic function is mildly reduced. Tricuspid regurgitation signal is inadequate for assessing PA pressure. Left Atrium: Left atrial size was normal in size. Right Atrium: Right atrial size was normal in size. Pericardium: Trivial pericardial effusion is present. Mitral Valve: The mitral valve is normal in structure. Mild mitral valve regurgitation. No evidence of mitral valve stenosis. Tricuspid Valve: The tricuspid valve is normal in structure. Tricuspid valve regurgitation is trivial. No evidence of tricuspid stenosis. Aortic Valve: The aortic valve is tricuspid. Aortic valve regurgitation is not visualized. No aortic stenosis is present. Pulmonic Valve: The pulmonic valve was grossly normal. Pulmonic valve regurgitation is trivial. No evidence of pulmonic stenosis. Aorta: Presence of intra-aortic balloon pump. Venous: The inferior vena cava is normal in size with less than 50% respiratory variability, suggesting right atrial pressure of 8 mmHg. IAS/Shunts: The atrial septum is grossly normal.  LEFT VENTRICLE PLAX 2D LVIDd:         4.70  cm  Diastology LVIDs:         3.60 cm  LV e' medial:    7.21 cm/s LV PW:         1.10 cm  LV E/e' medial:  12.2 LV IVS:        1.20 cm  LV e' lateral:   9.34 cm/s LVOT diam:     1.90 cm  LV E/e' lateral: 9.4 LV SV:         36 LV SV Index:   16 LVOT Area:     2.84 cm  RIGHT VENTRICLE            IVC RV Basal diam:  2.80 cm    IVC diam: 2.00 cm RV S prime:     7.15 cm/s TAPSE (M-mode): 1.5 cm LEFT ATRIUM  Index       RIGHT ATRIUM           Index LA diam:        3.40 cm 1.55 cm/m  RA Area:     13.70 cm LA Vol (A2C):   57.7 ml 26.32 ml/m RA Volume:   30.50 ml  13.91 ml/m LA Vol (A4C):   38.5 ml 17.56 ml/m LA Biplane Vol: 47.3 ml 21.57 ml/m  AORTIC VALVE LVOT Vmax:   79.20 cm/s LVOT Vmean:  56.100 cm/s LVOT VTI:    0.127 m  AORTA Ao Root diam: 3.60 cm Ao Asc diam:  3.00 cm MITRAL VALVE MV Area (PHT): 4.40 cm    SHUNTS MV Decel Time: 173 msec    Systemic VTI:  0.13 m MV E velocity: 88.25 cm/s  Systemic Diam: 1.90 cm Jodelle Red MD Electronically signed by Jodelle Red MD Signature Date/Time: 06/23/2020/5:26:12 PM    Final    VAS US DOPPLER PRE CABG  Result Date: 06/23/2020 PREOPERATIVE VASCULAR EVALUATION  Indications:      Pre-CABG. Risk Factors:     Hypertension, hyperlipidemia, current smoker, prior MI,                   coronary artery disease. Limitations:      Patient on balloon pump, status post right radial cath today Comparison Study: No prior study on file Performing Technologist: Sherren Kerns RVS  Examination Guidelines: A complete evaluation includes B-mode imaging, spectral Doppler, color Doppler, and power Doppler as needed of all accessible portions of each vessel. Bilateral testing is considered an integral part of a complete examination. Limited examinations for reoccurring indications may be performed as noted.  Right Carotid Findings: +----------+--------+--------+--------+--------+--------+             PSV cm/s EDV cm/s Stenosis Describe Comments   +----------+--------+--------+--------+--------+--------+  CCA Prox   57                                            +----------+--------+--------+--------+--------+--------+  CCA Distal 54                                            +----------+--------+--------+--------+--------+--------+  ICA Prox   44                                            +----------+--------+--------+--------+--------+--------+  ICA Distal 58                                            +----------+--------+--------+--------+--------+--------+  ECA        69                                            +----------+--------+--------+--------+--------+--------+ Portions of this table do not appear on this page. +---------+--------+--+--------+  Vertebral PSV cm/s 22 EDV cm/s  +---------+--------+--+--------+ Left Carotid Findings: +----------+--------+--------+--------+--------+--------+  PSV cm/s EDV cm/s Stenosis Describe Comments  +----------+--------+--------+--------+--------+--------+  CCA Prox   82                                            +----------+--------+--------+--------+--------+--------+  CCA Distal 79                                            +----------+--------+--------+--------+--------+--------+  ICA Prox   54                                            +----------+--------+--------+--------+--------+--------+  ICA Distal 49                                            +----------+--------+--------+--------+--------+--------+  ECA        69                                            +----------+--------+--------+--------+--------+--------+ +----------+--------+--------+--------+------------+  Subclavian PSV cm/s EDV cm/s Describe Arm Pressure  +----------+--------+--------+--------+------------+             74                                       +----------+--------+--------+--------+------------+ +---------+--------+--+--------+  Vertebral PSV cm/s 38 EDV cm/s  +---------+--------+--+--------+  ABI Findings:  +--------+------------------+-----+--------+----------+  Right    Rt Pressure (mmHg) Index Waveform Comment     +--------+------------------+-----+--------+----------+  Brachial                                   restricted  +--------+------------------+-----+--------+----------+ +--------+------------------+-----+--------+-------+  Left     Lt Pressure (mmHg) Index Waveform Comment  +--------+------------------+-----+--------+-------+  Brachial 110                                        +--------+------------------+-----+--------+-------+ Limited study secondary to balloon pump.  Right Doppler Findings: +--------+--------+-----+-------+----------+  Site     Pressure Index Doppler Comments    +--------+--------+-----+-------+----------+  Brachial                        restricted  +--------+--------+-----+-------+----------+  Left Doppler Findings: +--------+--------+-----+-------+--------+  Site     Pressure Index Doppler Comments  +--------+--------+-----+-------+--------+  Brachial 110                              +--------+--------+-----+-------+--------+ Technologist Notes: Limited study secondary to balloon pump and radial cath today. Limited study secondary to balloon pump.  Summary: Right Carotid: There was no evidence of thrombus, dissection, atherosclerotic                plaque  or stenosis in the cervical carotid system. Left Carotid: There was no evidence of thrombus, dissection, atherosclerotic               plaque or stenosis in the cervical carotid system. Vertebrals:  Bilateral vertebral arteries demonstrate antegrade flow. Subclavians: Normal flow hemodynamics were seen in bilateral subclavian              arteries. Left Upper Extremity: Limited study secondary to balloon pump.  Electronically signed by Lemar Livings MD on 06/23/2020 at 4:48:03 PM.    Final    ECHOCARDIOGRAM LIMITED  Result Date: 06/28/2020    ECHOCARDIOGRAM LIMITED REPORT   Patient Name:   [name] Date of Exam: 06/28/2020  Medical Rec #:  [id number]      Height:       69.0 in Accession #:    [id number]     Weight:       226.0 lb Date of Birth:  05/01/1981     BSA:          2.176 m Patient Age:    38 years       BP:           119/72 mmHg Patient Gender: M              HR:           81 bpm. Exam Location:  Inpatient Procedure: Limited Echo, Cardiac Doppler and Color Doppler Indications:    CHF  History:        Patient has prior history of Echocardiogram examinations. CAD                 and Previous Myocardial Infarction, Prior CABG; Risk                 Factors:Dyslipidemia and Current Smoker.  Sonographer:    Lavenia Atlas Referring Phys: [id number] BROADUS Z ATKINS IMPRESSIONS  1. Left ventricular ejection fraction, by estimation, is 40 to 45%. The left ventricle has mildly decreased function. The left ventricle demonstrates regional wall motion abnormalities (see scoring diagram/findings for description). There is moderate concentric left ventricular hypertrophy. Left ventricular diastolic parameters are consistent with Grade I diastolic dysfunction (impaired relaxation). There is akinesis of the left ventricular, apical segment. There is hypokinesis of the left ventricular, mid inferoseptal wall. There is hypokinesis of the left ventricular, apical septal wall.  2. Right ventricular systolic function is normal. The right ventricular size is normal.  3. The mitral valve is normal in structure. No evidence of mitral valve regurgitation. No evidence of mitral stenosis.  4. The aortic valve is normal in structure. Aortic valve regurgitation is not visualized. No aortic stenosis is present.  5. Compared to prior echo, LVF has improved. Recommend definity contrast study to better delineate wall motiont and EF. FINDINGS  Left Ventricle: Left ventricular ejection fraction, by estimation, is 40 to 45%. The left ventricle has mildly decreased function. The left ventricle demonstrates regional wall motion abnormalities. The left ventricular  internal cavity size was normal in size. There is moderate concentric left ventricular hypertrophy. Left ventricular diastolic parameters are consistent with Grade I diastolic dysfunction (impaired relaxation). Normal left ventricular filling pressure. Right Ventricle: The right ventricular size is normal. No increase in right ventricular wall thickness. Right ventricular systolic function is normal. Left Atrium: Left atrial size was normal in size. Right Atrium: Right atrial size was normal in size. Pericardium: There is no evidence of pericardial effusion. Mitral Valve: The mitral  valve is normal in structure. No evidence of mitral valve stenosis. Tricuspid Valve: The tricuspid valve is normal in structure. Tricuspid valve regurgitation is not demonstrated. No evidence of tricuspid stenosis. Aortic Valve: The aortic valve is normal in structure. Aortic valve regurgitation is not visualized. No aortic stenosis is present. Pulmonic Valve: The pulmonic valve was normal in structure. Pulmonic valve regurgitation is not visualized. No evidence of pulmonic stenosis. Aorta: The aortic root is normal in size and structure. IAS/Shunts: No atrial level shunt detected by color flow Doppler. LEFT VENTRICLE PLAX 2D LVIDd:         4.30 cm      Diastology LVIDs:         3.40 cm      LV e' medial:    6.74 cm/s LV PW:         1.30 cm      LV E/e' medial:  9.5 LV IVS:        1.30 cm      LV e' lateral:   9.57 cm/s                             LV E/e' lateral: 6.7  LV Volumes (MOD) LV vol d, MOD A4C: 160.0 ml LV vol s, MOD A4C: 102.0 ml LV SV MOD A4C:     160.0 ml LEFT ATRIUM         Index LA diam:    3.50 cm 1.61 cm/m  AORTIC VALVE LVOT Vmax:   107.00 cm/s LVOT Vmean:  70.600 cm/s LVOT VTI:    0.234 m  AORTA Ao Root diam: 3.30 cm MITRAL VALVE MV Area (PHT): 3.85 cm    SHUNTS MV Decel Time: 197 msec    Systemic VTI: 0.23 m MV E velocity: 63.80 cm/s MV A velocity: 95.10 cm/s MV E/A ratio:  0.67 Armanda Magicraci Turner MD Electronically signed  by Armanda Magicraci Turner MD Signature Date/Time: 06/28/2020/12:32:22 PM    Final    Discharge Instructions    Discharge instructions   Complete by: As directed       Discharge Medications: Allergies as of 06/29/2020      Reactions   Penicillins Anaphylaxis, Swelling, Rash   "Rash from head to toe, throat closed up" with amoxicillin at 39 years old PCN reaction causing immediate rash, facial/tongue/throat swelling, SOB or lightheadedness with hypotension: Yes Has patient had a PCN reaction causing severe rash involving mucus membranes or skin necrosis: Yes Has patient had a PCN reaction that required hospitalization: No Has patient had a PCN reaction occurring within the last 10 years: No If all of the above answers are "NO", then may proceed with Cephalosporin use.      Medication List    STOP taking these medications   atorvastatin 80 MG tablet Commonly known as: LIPITOR   hydrochlorothiazide 12.5 MG capsule Commonly known as: MICROZIDE   HYDROcodone-acetaminophen 5-325 MG tablet Commonly known as: NORCO/VICODIN   nitroGLYCERIN 0.4 MG SL tablet Commonly known as: NITROSTAT     TAKE these medications   amiodarone 200 MG tablet Commonly known as: PACERONE Take 1 tablet (200 mg total) by mouth 2 (two) times daily. For 4 days then take 200 mg daily thereafter   aspirin 81 MG EC tablet Take 1 tablet (81 mg total) by mouth daily. Swallow whole. What changed:   how much to take  how to take this  when to take this  additional instructions  clopidogrel 75 MG tablet Commonly known as: PLAVIX Take 1 tablet (75 mg total) by mouth daily.   colchicine 0.6 MG tablet Take 0.5 tablets (0.3 mg total) by mouth 2 (two) times daily. Take until finished   furosemide 40 MG tablet Commonly known as: Lasix Take 1 tablet (40 mg total) by mouth daily. For one week then stop.   isosorbide dinitrate 10 MG tablet Commonly known as: ISORDIL Take 1 tablet (10 mg total) by mouth 3 (three)  times daily. Take until finished   LORazepam 0.5 MG tablet Commonly known as: ATIVAN Take 0.5 mg by mouth 2 (two) times daily.   oxyCODONE 5 MG immediate release tablet Commonly known as: Oxy IR/ROXICODONE Take 1 tablet (5 mg total) by mouth every 4 (four) hours as needed for severe pain.   potassium chloride SA 20 MEQ tablet Commonly known as: KLOR-CON Take 1 tablet (20 mEq total) by mouth daily. For one week then stop.   rosuvastatin 40 MG tablet Commonly known as: CRESTOR Take 1 tablet (40 mg total) by mouth daily.      The patient has been discharged on:   1.Beta Blocker:  Yes [   ]                              No   [x   ]                              If No, reason:Bradycardia;will likely start after discharge  2.Ace Inhibitor/ARB: Yes [   ]                                     No  [ x   ]                                     If No, reason:Labile BP  3.Statin:   Yes [  x ]                  No  [   ]                  If No, reason:  4.Ecasa:  Yes  [  x ]                  No   [   ]                  If No, reason:  Follow Up Appointments:  Follow-up Information    Linden Dolin, MD. Go on 07/04/2020.   Specialty: Cardiothoracic Surgery Why: Appointment time is at 10:00 am Contact information: [address] [place] [address] [phone]        Beatriz Stallion., PA-C. Go on 07/14/2020.   Specialty: Physician Assistant Why: Appointment time is at 2:15 pm  Contact information: [address] [place] [address] [place] [address] [phone]               Signed: Lelon Huh [facility] 06/29/2020, 8:10 AM

## 2020-06-28 NOTE — Progress Notes (Signed)
Progress Note  Patient Name: [name] Date of Encounter: 06/28/2020  [facility] Cardiologist: Kirk Ruths, MD   Subjective   Feeling better this morning.  Has moved to [facility].  Postop soreness.  No significant shortness of breath, no syncope, no bleeding.  Urinating.  Inpatient Medications    Scheduled Meds: . acetaminophen  1,000 mg Oral Q6H   Or  . acetaminophen (TYLENOL) oral liquid 160 mg/5 mL  1,000 mg Per Tube Q6H  . amiodarone  400 mg Oral BID  . aspirin EC  81 mg Oral Daily  . bisacodyl  10 mg Oral Daily   Or  . bisacodyl  10 mg Rectal Daily  . chlorhexidine gluconate (MEDLINE KIT)  15 mL Mouth Rinse BID  . Chlorhexidine Gluconate Cloth  6 each Topical Daily  . clopidogrel  75 mg Oral Daily  . colchicine  0.3 mg Oral BID  . docusate sodium  200 mg Oral Daily  . enoxaparin (LOVENOX) injection  40 mg Subcutaneous QHS  . furosemide  40 mg Intravenous BID  . insulin aspart  0-24 Units Subcutaneous Q4H  . isosorbide dinitrate  10 mg Oral TID  . pantoprazole  40 mg Oral Daily  . rosuvastatin  40 mg Oral Daily  . sodium chloride flush  3 mL Intravenous Q12H   Continuous Infusions: . sodium chloride    . insulin Stopped (06/24/20 2251)  . lactated ringers     PRN Meds: dextrose, lactated ringers, metoprolol tartrate, ondansetron (ZOFRAN) IV, oxyCODONE, sodium chloride flush, sodium chloride flush, traMADol   Vital Signs    Vitals:   06/27/20 2200 06/27/20 2300 06/28/20 0200 06/28/20 0400  BP: 107/68  (!) 100/59   Pulse:  65  64  Resp: 18  18   Temp:  98.8 F (37.1 C)  98.2 F (36.8 C)  TempSrc:  Oral  Oral  SpO2:  99%  99%  Weight:    102.5 kg  Height:        Intake/Output Summary (Last 24 hours) at 06/28/2020 0714 Last data filed at 06/28/2020 0000 Gross per 24 hour  Intake 516.66 ml  Output 3025 ml  Net -2508.34 ml   Last 3 Weights 06/28/2020 06/27/2020 06/26/2020  Weight (lbs) 225 lb 15.5 oz 232 lb 5.8 oz 232 lb 5.8 oz  Weight (kg) 102.5  kg 105.4 kg 105.4 kg      Telemetry    Sinus rhythm no adverse arrhythmias- Personally Reviewed  ECG    No new- Personally Reviewed  Physical Exam   GEN: No acute distress.   Neck: No JVD Cardiac: RRR, no murmurs, rubs, or gallops.  Chest scar well-healing Respiratory: Clear to auscultation bilaterally. GI: Soft, nontender, non-distended  MS: Minimal lower extremity edema; No deformity. Neuro:  Nonfocal  Psych: Normal affect   Labs    High Sensitivity Troponin:   Recent Labs  Lab 06/23/20 0400 06/23/20 0552 06/23/20 1330 06/23/20 2047  TROPONINIHS 771* 2,292* 21,634* >27,000*      Chemistry Recent Labs  Lab 06/23/20 1330 06/24/20 0440 06/24/20 2117 06/25/20 0442 06/26/20 0321 06/27/20 0359 06/28/20 0032  NA 136   < > 137   < > 136 136 136  K 4.0   < > 3.9   < > 3.5 3.5 4.1  CL 104   < > 108   < > 105 104 100  CO2 21*   < > 22   < > _0 GLUCOSE 97   < >  114*   < > 113* 99 100*  BUN 7   < > 10   < > _0 CREATININE 0.84   < > 0.93   < > 0.85 0.82 1.07  CALCIUM 8.4*   < > 7.3*   < > 8.0* 7.8* 8.0*  PROT 6.1*  --  5.0*  --   --   --   --   ALBUMIN 3.4*  --  3.0*  --   --   --   --   AST 179*  --  159*  --   --   --   --   ALT 47*  --  47*  --   --   --   --   ALKPHOS 45  --  35*  --   --   --   --   BILITOT 1.0  --  0.8  --   --   --   --   GFRNONAA >60   < > >60   < > >60 >60 >60  GFRAA >60   < > >60   < > >60 >60 >60  ANIONGAP 11   < > 7   < > _1 < > = values in this interval not displayed.     Hematology Recent Labs  Lab 06/26/20 0321 06/27/20 0359 06/28/20 0032  WBC 13.9* 10.8* 9.6  RBC 3.91* 3.41* 3.54*  HGB 12.2* 10.7* 10.7*  HCT 36.1* 32.0* 32.3*  MCV 92.3 93.8 91.2  MCH 31.2 31.4 30.2  MCHC 33.8 33.4 33.1  RDW 13.8 13.9 13.6  PLT 248 209 252    BNPNo results for input(s): BNP, PROBNP in the last 168 hours.   DDimer No results for input(s): DDIMER in the last 168 hours.   Radiology    DG Chest Port 1  View  Result Date: 06/27/2020 CLINICAL DATA:  Pleural effusion, open heart surgery EXAM: PORTABLE CHEST 1 VIEW COMPARISON:  06/25/2020 FINDINGS: Interval extubation, removal of enteric tube, removal right IJ Swan-Ganz catheter, removal intra-aortic balloon pump, and removal of bilateral chest tubes/mediastinal drains. Stable right IJ sheath. Postsurgical changes related to prior CABG. Status post median sternotomy. Lungs are essentially clear. No pneumothorax. No pleural effusion is seen. Mild cardiomegaly. IMPRESSION: Interval extubation and removal of multiple support apparatus as above. No pneumothorax is seen. Electronically Signed   By: Julian Hy M.D.   On: 06/27/2020 08:15    Cardiac Studies   Echo on admission EF 35 to 40%  Patient Profile     39 y.o. 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  Continue to encourage ambulation -Secondary risk factor prevention.  Aspirin 81, Plavix 75 (non-STEMI), isosorbide for radial harvest graft, Crestor 40 high intensity.  Blood pressure at this time does not allow for beta-blocker or ARB. -On amiodarone prophylaxis postoperatively.  No evidence of atrial fibrillation. -Getting furosemide 40 mg IV twice daily for fluid overload perioperatively.  Progressing well. -In the past had some muscle soreness with statins.  Hopefully Crestor will work for him.  Continue to encourage. -Continue to encourage tobacco cessation.  Cardiac rehab when able. -Chest x-ray personally reviewed this morning shows improvement.  No significant edema.     For questions or updates, please contact [facility] Please consult  [email or web address] for contact info under        Signed, Candee Furbish, MD  06/28/2020, 7:14 AM

## 2020-06-28 NOTE — Plan of Care (Signed)
  Problem: Education: °Goal: Will demonstrate proper wound care and an understanding of methods to prevent future damage °Outcome: Progressing °Goal: Knowledge of disease or condition will improve °Outcome: Progressing °Goal: Knowledge of the prescribed therapeutic regimen will improve °Outcome: Progressing °Goal: Individualized Educational Video(s) °Outcome: Progressing °  °Problem: Activity: °Goal: Risk for activity intolerance will decrease °Outcome: Progressing °  °Problem: Cardiac: °Goal: Will achieve and/or maintain hemodynamic stability °Outcome: Progressing °  °Problem: Clinical Measurements: °Goal: Postoperative complications will be avoided or minimized °Outcome: Progressing °  °Problem: Respiratory: °Goal: Respiratory status will improve °Outcome: Progressing °  °Problem: Skin Integrity: °Goal: Wound healing without signs and symptoms of infection °Outcome: Progressing °Goal: Risk for impaired skin integrity will decrease °Outcome: Progressing °  °Problem: Health Behavior/Discharge Planning: °Goal: Ability to manage health-related needs will improve °Outcome: Progressing °  °Problem: Clinical Measurements: °Goal: Ability to maintain clinical measurements within normal limits will improve °Outcome: Progressing °Goal: Will remain free from infection °Outcome: Progressing °Goal: Diagnostic test results will improve °Outcome: Progressing °Goal: Respiratory complications will improve °Outcome: Progressing °Goal: Cardiovascular complication will be avoided °Outcome: Progressing °  °Problem: Activity: °Goal: Risk for activity intolerance will decrease °Outcome: Progressing °  °Problem: Coping: °Goal: Level of anxiety will decrease °Outcome: Progressing °  °Problem: Pain Managment: °Goal: General experience of comfort will improve °Outcome: Progressing °  °Problem: Safety: °Goal: Ability to remain free from injury will improve °Outcome: Progressing °  °Problem: Skin Integrity: °Goal: Risk for impaired skin  integrity will decrease °Outcome: Progressing °  °

## 2020-06-28 NOTE — Progress Notes (Signed)
epicardial wire removed by RN tolerated well. Bedrest emphasized for 1 hour. No complaints presented.

## 2020-06-28 NOTE — Progress Notes (Signed)
  Echocardiogram 2D Echocardiogram has been performed.  [name] 06/28/2020, 11:30 AM

## 2020-06-28 NOTE — Progress Notes (Signed)
Encouraged to ambulate along the hallway but refused, claimed he just went  to the bathroom.

## 2020-06-28 NOTE — Progress Notes (Signed)
CARDIAC REHAB PHASE I   PRE:  Rate/Rhythm: 93 SR    BP: sitting 119/62    SaO2: 91-92 RA  MODE:  Ambulation: 300 ft   POST:  Rate/Rhythm: 105 ST    BP: sitting 117/70     SaO2: 89-91 RA  Pt able to move to EOB and stand independently. Used RW in hall, slow and steady. C/o SOB and pain with movement. SaO2 borderline low. Last 30 ft pt able to walk without RW. Steady, will not need RW for home.  To recliner after walk. Practiced IS x1, 750 mL. Pt needs encouragement to walk and use IS. Gave him [facility] booklet, smoking cessation sheet and diet sheet to begin reviewing. Will f/u tomorrow for education. [phone]  Harriet Masson CES, ACSM 06/28/2020 1:42 PM

## 2020-06-29 DIAGNOSIS — I5021 Acute systolic (congestive) heart failure: Secondary | ICD-10-CM

## 2020-06-29 LAB — BASIC METABOLIC PANEL
Anion gap: 8 (ref 5–15)
BUN: 14 mg/dL (ref 6–20)
CO2: 29 mmol/L (ref 22–32)
Calcium: 8.6 mg/dL — ABNORMAL LOW (ref 8.9–10.3)
Chloride: 99 mmol/L (ref 98–111)
Creatinine, Ser: 1.07 mg/dL (ref 0.61–1.24)
GFR calc Af Amer: >60 mL/min (ref >60)
GFR calc non Af Amer: >60 mL/min (ref >60)
Glucose, Bld: 106 mg/dL — ABNORMAL HIGH (ref 70–99)
Potassium: 3.7 mmol/L (ref 3.5–5.1)
Sodium: 136 mmol/L (ref 135–145)

## 2020-06-29 MED ORDER — FUROSEMIDE 10 MG/ML IJ SOLN
40.0000 mg | Freq: Every day | INTRAMUSCULAR | Status: DC
  Administered 2020-06-29: 40 mg via INTRAVENOUS
  Filled 2020-06-29: qty 4

## 2020-06-29 MED ORDER — OXYCODONE HCL 5 MG PO TABS: 5.0000 mg | ORAL_TABLET | ORAL | 0 refills | Status: DC | PRN

## 2020-06-29 MED ORDER — ROSUVASTATIN CALCIUM 40 MG PO TABS
40.0000 mg | ORAL_TABLET | Freq: Every day | ORAL | 1 refills | Status: DC
Start: 2020-06-29 — End: 2020-09-05

## 2020-06-29 MED ORDER — POTASSIUM CHLORIDE CRYS ER 20 MEQ PO TBCR: 20.0000 meq | EXTENDED_RELEASE_TABLET | Freq: Every day | ORAL | 0 refills | Status: DC

## 2020-06-29 MED ORDER — COLCHICINE 0.6 MG PO TABS
0.3000 mg | ORAL_TABLET | Freq: Two times a day (BID) | ORAL | 0 refills | Status: DC
Start: 2020-06-29 — End: 2020-07-14

## 2020-06-29 MED ORDER — POTASSIUM CHLORIDE CRYS ER 20 MEQ PO TBCR
30.0000 meq | EXTENDED_RELEASE_TABLET | Freq: Two times a day (BID) | ORAL | Status: DC
  Administered 2020-06-29: 30 meq via ORAL
  Filled 2020-06-29: qty 1

## 2020-06-29 MED ORDER — AMIODARONE HCL 200 MG PO TABS
200.0000 mg | ORAL_TABLET | Freq: Two times a day (BID) | ORAL | 1 refills | Status: DC
Start: 2020-06-29 — End: 2020-10-20

## 2020-06-29 MED ORDER — CLOPIDOGREL BISULFATE 75 MG PO TABS
75.0000 mg | ORAL_TABLET | Freq: Every day | ORAL | 1 refills | Status: DC
Start: 2020-06-29 — End: 2020-09-05

## 2020-06-29 MED ORDER — FUROSEMIDE 40 MG PO TABS: 40.0000 mg | ORAL_TABLET | Freq: Every day | ORAL | 0 refills | Status: DC

## 2020-06-29 MED ORDER — ISOSORBIDE DINITRATE 10 MG PO TABS
10.0000 mg | ORAL_TABLET | Freq: Three times a day (TID) | ORAL | 0 refills | Status: DC
Start: 2020-06-29 — End: 2020-10-20

## 2020-06-29 MED ORDER — ISOSORBIDE DINITRATE 10 MG PO TABS
10.0000 mg | ORAL_TABLET | Freq: Three times a day (TID) | ORAL | 0 refills | Status: DC
Start: 2020-06-29 — End: 2020-06-29

## 2020-06-29 MED FILL — Magnesium Sulfate Inj 50%: INTRAMUSCULAR | Qty: 10 | Status: AC

## 2020-06-29 MED FILL — Mannitol IV Soln 20%: INTRAVENOUS | Qty: 500 | Status: AC

## 2020-06-29 MED FILL — Heparin Sodium (Porcine) Inj 1000 Unit/ML: INTRAMUSCULAR | Qty: 30 | Status: AC

## 2020-06-29 MED FILL — Sodium Chloride IV Soln 0.9%: INTRAVENOUS | Qty: 2000 | Status: AC

## 2020-06-29 MED FILL — Sodium Bicarbonate IV Soln 8.4%: INTRAVENOUS | Qty: 50 | Status: AC

## 2020-06-29 MED FILL — Electrolyte-R (PH 7.4) Solution: INTRAVENOUS | Qty: 4000 | Status: AC

## 2020-06-29 MED FILL — Potassium Chloride Inj 2 mEq/ML: INTRAVENOUS | Qty: 40 | Status: AC

## 2020-06-29 MED FILL — Heparin Sodium (Porcine) Inj 1000 Unit/ML: INTRAMUSCULAR | Qty: 10 | Status: AC

## 2020-06-29 NOTE — Progress Notes (Signed)
      [address]       [place] [address]             [phone]        5 Days Post-Op Procedure(s) (LRB): CORONARY ARTERY BYPASS GRAFTING (CABG) x 6, bilateral IMA, LIMA TO LAD, RIMA TO PDA, LEFT RADIAL ARTERY TO DIAG. 1 SEQ TO OM1, SVG TO OM2, SVG TO ACUTE MARGINAL (N/A) RADIAL ARTERY HARVEST (Left) TRANSESOPHAGEAL ECHOCARDIOGRAM (TEE) (N/A) INDOCYANINE GREEN FLUORESCENCE IMAGING (ICG) (N/A) ENDOVEIN HARVEST OF GREATER SAPHENOUS VEIN (Right)  Subjective: Patient has sternal,incisional pain this am. He still has some tingling/numbness in left thumb, 3rd and 4th fingers.  Objective: Vital signs in last 24 hours: Temp:  [98 F (36.7 C)-98.2 F (36.8 C)] 98.2 F (36.8 C) (09/15 0332) Pulse Rate:  [76-81] 76 (09/15 0332) Cardiac Rhythm: Normal sinus rhythm (09/14 1951) Resp:  [16-23] 17 (09/15 0300) BP: (96-122)/(59-80) 122/70 (09/15 0332) SpO2:  [93 %-98 %] 98 % (09/15 0332) Weight:  [99.5 kg] 99.5 kg (09/15 0332)  Pre op weight 104.3 kg Current Weight  06/29/20 99.5 kg       Intake/Output from previous day: 09/14 0701 - 09/15 0700 In: 440 [P.O.:440] Out: 3125 [Urine:3125]   Physical Exam:  Cardiovascular: RRR Pulmonary: Clear to auscultation bilaterally Abdomen: Soft, non tender, bowel sounds present. Extremities: Trace bilateral lower extremity edema. Motor intact but tingling left thumb, 3rd and 4th fingers Wounds: Sternal, LUE, and RLE wounds are clean and dry.  No erythema or signs of infection.  Lab Results: CBC: Recent Labs    06/27/20 0359 06/28/20 0032  WBC 10.8* 9.6  HGB 10.7* 10.7*  HCT 32.0* 32.3*  PLT 209 252   BMET:  Recent Labs    06/28/20 0032 06/29/20 0019  NA 136 136  K 4.1 3.7  CL 100 99  CO2 26 29  GLUCOSE 100* 106*  BUN 12 14  CREATININE 1.07 1.07  CALCIUM 8.0* 8.6*    PT/INR:  Lab Results  Component Value Date   INR 1.3 (H) 06/24/2020   INR 0.89 11/30/2010   ABG:  INR: Will add last result  for INR, ABG once components are confirmed Will add last 4 CBG results once components are confirmed  Assessment/Plan:  1. CV - SR with HR in the 70-80's. On Plavix 75 mg daily, baby ec asa, Colchine 0.3 mg bid, Isordil 10 mg tid. Echo done yesterday showed LVEF 40-45% (improved since last echo), left ventricle demonstrates regional wall motion abnormalities, left Will likely consider starting BB after discharge. ventricular diastolic parameters are consistent with Grade I diastolic dysfunction (impaired relaxation). and no valvular abnormalities 2.  Pulmonary - On room air.  Encourage incentive spirometer. 3. Volume Overload - On Lasix 40 mg daily 4.  Expected post op acute blood loss anemia - Last H and H  stable at 10.7 and 32.3 5. Supplement potassium 6. Discharge  Teddie Mehta M ZimmermanPA-C 06/29/2020,7:12 AM

## 2020-06-29 NOTE — Progress Notes (Addendum)
   Notes reviewed.  Getting close discharge.  Trial low-dose beta-blocker on discharge.  Has clinic follow-up, 9/30.  Donato Schultz, MD

## 2020-06-29 NOTE — Progress Notes (Signed)
All set for discharge home, discharge instructions given to pt. Waiting for ride home.

## 2020-06-29 NOTE — Progress Notes (Signed)
Discharged home  Discharge instructions given to pt. Belongings taken home.

## 2020-06-29 NOTE — Progress Notes (Signed)
Pt doing well this am. Delphina Cahill again last night. IS up to 1000 mL. Discussed sternal precautions, IS, smoking cessation, diet, exercise, and CRPII. Pt receptive. Will refer to [facility] CPRII. He is motivated to quit smoking. [phone] Ethelda Chick CES, ACSM 9:04 AM 06/29/2020

## 2020-06-29 NOTE — Plan of Care (Signed)
°  Problem: Education: Goal: Will demonstrate proper wound care and an understanding of methods to prevent future damage Outcome: Progressing Goal: Knowledge of disease or condition will improve Outcome: Progressing Goal: Knowledge of the prescribed therapeutic regimen will improve Outcome: Progressing Goal: Individualized Educational Video(s) Outcome: Progressing   Problem: Activity: Goal: Risk for activity intolerance will decrease Outcome: Progressing   Problem: Cardiac: Goal: Will achieve and/or maintain hemodynamic stability Outcome: Progressing   Problem: Clinical Measurements: Goal: Postoperative complications will be avoided or minimized Outcome: Progressing   Problem: Respiratory: Goal: Respiratory status will improve Outcome: Progressing   Problem: Skin Integrity: Goal: Wound healing without signs and symptoms of infection Outcome: Progressing Goal: Risk for impaired skin integrity will decrease Outcome: Progressing   Problem: Health Behavior/Discharge Planning: Goal: Ability to manage health-related needs will improve Outcome: Progressing   Problem: Clinical Measurements: Goal: Ability to maintain clinical measurements within normal limits will improve Outcome: Progressing Goal: Will remain free from infection Outcome: Progressing Goal: Diagnostic test results will improve Outcome: Progressing Goal: Respiratory complications will improve Outcome: Progressing Goal: Cardiovascular complication will be avoided Outcome: Progressing   Problem: Activity: Goal: Risk for activity intolerance will decrease Outcome: Progressing   Problem: Coping: Goal: Level of anxiety will decrease Outcome: Progressing   Problem: Pain Managment: Goal: General experience of comfort will improve Outcome: Progressing   Problem: Safety: Goal: Ability to remain free from injury will improve Outcome: Progressing   Problem: Skin Integrity: Goal: Risk for impaired skin  integrity will decrease Outcome: Progressing

## 2020-07-04 ENCOUNTER — Ambulatory Visit (INDEPENDENT_AMBULATORY_CARE_PROVIDER_SITE_OTHER): Payer: Self-pay | Admitting: Cardiothoracic Surgery

## 2020-07-04 ENCOUNTER — Ambulatory Visit
Admission: RE | Admit: 2020-07-04 | Discharge: 2020-07-04 | Disposition: A | Discharge status: Home / Self Care | Payer: No Typology Code available for payment source | Source: Ambulatory Visit | Attending: Cardiothoracic Surgery | Admitting: Cardiothoracic Surgery

## 2020-07-04 ENCOUNTER — Other Ambulatory Visit: Payer: Self-pay | Admitting: *Deleted

## 2020-07-04 ENCOUNTER — Other Ambulatory Visit: Payer: Self-pay

## 2020-07-04 VITALS — BP 119/90 | HR 90 | Temp 97.9°F | Resp 20 | Ht 69.0 in | Wt 216.0 lb

## 2020-07-04 DIAGNOSIS — J90 Pleural effusion, not elsewhere classified: Secondary | ICD-10-CM

## 2020-07-04 DIAGNOSIS — I2510 Atherosclerotic heart disease of native coronary artery without angina pectoris: Secondary | ICD-10-CM

## 2020-07-04 DIAGNOSIS — Z951 Presence of aortocoronary bypass graft: Secondary | ICD-10-CM

## 2020-07-04 NOTE — Progress Notes (Signed)
[address]       [place] [address]             [phone]     CARDIOTHORACIC SURGERY OFFICE NOTE  Referring Provider is Jens Som, Madolyn Frieze, MD Primary Cardiologist is Olga Millers, MD PCP is April Manson, NP   HPI:  39 year old man presents for first postoperative visit after recent CABG.  He has been doing well since discharge home.  He denies complaints of chest pain or shortness of breath but has had occasional diarrhea; this is likely related to colchicine.  He is eager to get back to work. Current Outpatient Medications  Medication Sig Dispense Refill   amiodarone (PACERONE) 200 MG tablet Take 1 tablet (200 mg total) by mouth 2 (two) times daily. For 4 days then take 200 mg daily thereafter 60 tablet 1   aspirin EC 81 MG EC tablet Take 1 tablet (81 mg total) by mouth daily. Swallow whole. 30 tablet 11   clopidogrel (PLAVIX) 75 MG tablet Take 1 tablet (75 mg total) by mouth daily. 30 tablet 1   colchicine 0.6 MG tablet Take 0.5 tablets (0.3 mg total) by mouth 2 (two) times daily. Take until finished 50 tablet 0   furosemide (LASIX) 40 MG tablet Take 1 tablet (40 mg total) by mouth daily. For one week then stop. 7 tablet 0   isosorbide dinitrate (ISORDIL) 10 MG tablet Take 1 tablet (10 mg total) by mouth 3 (three) times daily. Take until finished 78 tablet 0   LORazepam (ATIVAN) 0.5 MG tablet Take 0.5 mg by mouth 2 (two) times daily.  2   oxyCODONE (OXY IR/ROXICODONE) 5 MG immediate release tablet Take 1 tablet (5 mg total) by mouth every 4 (four) hours as needed for severe pain. 30 tablet 0   potassium chloride (KLOR-CON) 20 MEQ tablet Take 1 tablet (20 mEq total) by mouth daily. For one week then stop. 7 tablet 0   rosuvastatin (CRESTOR) 40 MG tablet Take 1 tablet (40 mg total) by mouth daily. 30 tablet 1   No current facility-administered medications for this visit.      Physical Exam:   BP 119/90    Pulse 90    Temp 97.9 F (36.6  C) (Skin)    Resp 20    Ht 5\' 9"  (1.753 m)    Wt 98 kg    SpO2 95% Comment: RA   BMI 31.90 kg/m   General:  Well-appearing no acute distress  Chest:   Clear to auscultation  CV:  Regular rate and rhythm  Incisions:  Healing well; staples removed from left forearm  Abdomen:  Soft nontender  Extremities:  Warm and well-perfused  Diagnostic Tests:  No current x-ray   Impression:  Doing well after CABG for low EF  Plan:  Follow-up in 2 weeks Continue to observe sternal precautions Stop colchicine He should talk with his employer to find out what mechanisms are available for temporary disability  I spent in excess of 15 minutes during the conduct of this office consultation and >50% of this time involved direct face-to-face encounter with the patient for counseling and/or coordination of their care.  Level 2                 10 minutes Level 3                 15 minutes Level 4  25 minutes Level 5                 40 minutes  B.  Lorayne Marek, MD 07/04/2020 10:40 AM

## 2020-07-05 DIAGNOSIS — Z736 Limitation of activities due to disability: Secondary | ICD-10-CM

## 2020-07-06 LAB — ECHO INTRAOPERATIVE TEE
AV Mean grad: 3 mmHg
Ao-asc: 3 cm
Height: 69 in
LVOT diameter: 22 mm
Mean grad: 1 mmHg
STJ: 2.6 cm
Sinus: 3.2 cm
Weight: 3680 oz

## 2020-07-07 NOTE — Progress Notes (Signed)
Cardiology Office Note   Date:  07/14/2020   ID:  Colin KEDZIERSKI, DOB 1980/12/24, MRN [id number]  PCP:  April Manson, NP  Cardiologist:  Olga Millers, MD EP: None  Chief Complaint  Patient presents with  . Hospitalization Follow-up    s/p CABG      History of Present Illness: [name] is a 39 y.o. male with a PMH of CAD s/p PCI to RCA/LAD in 2011 now s/p CABG 06/24/20, ICM, chronic combined CHF, HTN, HLD, and tobacco abuse  who presents for post-hospital follow-up.   He was admitted to the hospital 06/23/20-06/29/20 after presenting with an NSTEMI. HsTrop peaked at >27000. He was taken to the cath lab where he was found to have severe 3-vessel CAD with severely reduced LV systolic function of 25-30%. He had an IABP placed and TCTS was consulted who recommended CABG. Echocardiogram showed EF 35-40% with akinesis of the entire anteroseptal wall and hypokinesis of apical anterior/inferior wall, and mild MR. He underwent CABG x6 (LIMA to LAD, RIMA to PDA, SVG to OM2, L radial artery graft to diagonal branch and OM1, SVG to RCA) 06/24/20. His post-op course was uncomplicated. Repeat limited echo with improvement in EF to 40-45% with moderate concentric LVH, G1DD, ongoing wall motion abnormalities, and no significant valvular abnormalities. He was discharged home on amiodarone for rhythm ppx, plavix, aspirin, statin, isordil, and lasix (x1 week). Unable to add GDMT for his cardiomyopathy due to baseline bradycardia and soft blood pressures.   He presents today for post-hospital follow-up with his sister. He has been doing fairly well since discharge. Only complaint is left forearm/hand pain/numbness/weakness since his left radial artery harvest. This keeps him up at night. He reports he ran out of his oxycodone prescribed at discharge last night. He has had no recurrent angina. He has been sleeping in a recliner as it is still uncomfortable to lay flat. He has no complaints of SOB, DOE,  orthopnea, PND, LE edema, dizziness, lightheadedness, or syncope. He is anticipating a follow-up visit with Dr. Vickey Sages 07/18/20 and is hopeful to get his blessing to increase his activity level. He is anxious to get back to work as he has had no income since his recent MI. On a positive note, he did quit smoking!    Past Medical History:  Diagnosis Date  . CAD (coronary artery disease)    inferiot MI; LHC with EF 55% and basal to mid inferior hypokinesis. totally occluded RCA w/weak L-R collaterals. 50% ostial OM1. long 70% proximal LAD. Pt had promus DES to LAD and RCA. echo 9/11: EF 55%, no regional wall motion abnormalities   . GI bleed 9/11   secondary to mallory-weiss tear with endoscopic clipping   . Hyperlipidemia   . Hypertension   . Nephrolithiasis   . Transfusion history 2011/2012  . 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 CARDIAC SURGERY    . CORONARY ARTERY BYPASS GRAFT N/A 06/24/2020   Procedure: CORONARY ARTERY BYPASS GRAFTING (CABG) x 6, bilateral IMA, LIMA TO LAD, RIMA TO PDA, LEFT RADIAL ARTERY TO DIAG. 1 SEQ TO OM1, SVG TO OM2, SVG TO ACUTE MARGINAL;  Surgeon: Linden Dolin, MD;  Location: [facility] OR;  Service: Open Heart Surgery;  Laterality: N/A;  . ENDOVEIN HARVEST OF GREATER SAPHENOUS VEIN Right 06/24/2020   Procedure: ENDOVEIN HARVEST OF GREATER SAPHENOUS VEIN;  Surgeon: Vickey Sages,  Merri Brunette, MD;  Location: [facility] OR;  Service: Open Heart Surgery;  Laterality: Right;  . IABP INSERTION N/A 06/23/2020   Procedure: IABP Insertion;  Surgeon: Yvonne Kendall, MD;  Location: [facility] INVASIVE CV LAB;  Service: Cardiovascular;  Laterality: N/A;  . LEFT HEART CATH AND CORONARY ANGIOGRAPHY N/A 06/23/2020   Procedure: LEFT HEART CATH AND CORONARY ANGIOGRAPHY;  Surgeon: Yvonne Kendall, MD;  Location: [facility] INVASIVE CV LAB;  Service: Cardiovascular;  Laterality: N/A;  . patients states had two stent  surgery to his heart  03/30/2010  . RADIAL ARTERY HARVEST Left 06/24/2020   Procedure: RADIAL ARTERY HARVEST;  Surgeon: Linden Dolin, MD;  Location: [facility] OR;  Service: Open Heart Surgery;  Laterality: Left;  . TEE WITHOUT CARDIOVERSION N/A 06/24/2020   Procedure: TRANSESOPHAGEAL ECHOCARDIOGRAM (TEE);  Surgeon: Linden Dolin, MD;  Location: [facility] OR;  Service: Open Heart Surgery;  Laterality: N/A;     Current Outpatient Medications  Medication Sig Dispense Refill  . amiodarone (PACERONE) 200 MG tablet Take 1 tablet (200 mg total) by mouth 2 (two) times daily. For 4 days then take 200 mg daily thereafter 60 tablet 1  . aspirin EC 81 MG EC tablet Take 1 tablet (81 mg total) by mouth daily. Swallow whole. 30 tablet 11  . clopidogrel (PLAVIX) 75 MG tablet Take 1 tablet (75 mg total) by mouth daily. 30 tablet 1  . isosorbide dinitrate (ISORDIL) 10 MG tablet Take 1 tablet (10 mg total) by mouth 3 (three) times daily. Take until finished 78 tablet 0  . LORazepam (ATIVAN) 0.5 MG tablet Take 0.5 mg by mouth 2 (two) times daily.  2  . oxyCODONE (OXY IR/ROXICODONE) 5 MG immediate release tablet Take 1 tablet (5 mg total) by mouth every 4 (four) hours as needed for severe pain. 30 tablet 0  . rosuvastatin (CRESTOR) 40 MG tablet Take 1 tablet (40 mg total) by mouth daily. 30 tablet 1  . furosemide (LASIX) 40 MG tablet Take 1 tablet (40 mg total) by mouth daily as needed (for weight gain of 3 lbs overnight or 5 lbs in one week). 90 tablet 1  . potassium chloride SA (KLOR-CON) 20 MEQ tablet Take 1 tablet (20 mEq total) by mouth daily as needed. When you take Furosemide 90 tablet 1   No current facility-administered medications for this visit.    Allergies:   Penicillins    Social History:  The patient  reports that he has quit smoking. He smoked 1.50 packs per day. He has never used smokeless tobacco. He reports current alcohol use. He reports that he does not use drugs.   Family History:  The  patient's family history includes Cancer in his mother; Coronary artery disease in an other family member; Diabetes in his mother; Hyperlipidemia in his mother; Hypertension in his mother; Rheum arthritis in his father and mother; Stroke in an other family member.    ROS:  Please see the history of present illness.   Otherwise, review of systems are positive for none.   All other systems are reviewed and negative.    PHYSICAL EXAM: VS:  BP 124/62   Pulse 82   Ht 5\' 9"  (1.753 m)   Wt 217 lb (98.4 kg)   BMI 32.05 kg/m  , BMI Body mass index is 32.05 kg/m. GEN: Well nourished, well developed, in no acute distress HEENT: sclera anicteric Neck: no JVD, carotid bruits, or masses Cardiac: RRR; no murmurs, rubs, or gallops, no edema; sternal incision is  healing well.  Respiratory:  clear to auscultation bilaterally, normal work of breathing GI: soft, nontender, nondistended, + BS MS: no deformity or atrophy Skin: warm and dry, no rash; radial artery and SV graft sites are healing well Neuro:  Weak left grip strength, numbness to median nerve distribution Psych: euthymic mood, full affect   EKG:  EKG is ordered today. The ekg ordered today demonstrates sinus rhythm with PAC, chronic STE in V2-3, chronic anteroseptal/lateral TWIs; no significant change from previous.    Recent Labs: 06/24/2020: ALT 47 06/25/2020: Magnesium 2.0 06/28/2020: Hemoglobin 10.7; Platelets 252 06/29/2020: BUN 14; Creatinine, Ser 1.07; Potassium 3.7; Sodium 136    Lipid Panel    Component Value Date/Time   CHOL 199 06/24/2020 0440   TRIG 179 (H) 06/24/2020 0440   HDL 40 (L) 06/24/2020 0440   CHOLHDL 5.0 06/24/2020 0440   VLDL 36 06/24/2020 0440   LDLCALC 123 (H) 06/24/2020 0440   LDLDIRECT 102.9 06/11/2014 0812      Wt Readings from Last 3 Encounters:  07/14/20 217 lb (98.4 kg)  07/04/20 216 lb (98 kg)  06/29/20 219 lb 5.7 oz (99.5 kg)      Other studies Reviewed: Additional studies/ records that  were reviewed today include:   Left heart catheterization 06/23/20: Conclusions: 1. Severe three-vessel coronary artery disease, including occluded proximal/mid LAD stent with minimal collateral filling of the mid/distal LAD, sequential 90% and 70% mid/distal LCx lesions, and chronic total occlusion of mid RCA stent with bridging and left-to-right collaterals. 2. Severely reduced left ventricular systolic function (LVEF 25-30%) with moderately elevated filling pressure (LVEDP ~30 mmHg). 3. Successful placement of 50 mL IABP via the right common femoral artery.  Recommendations: 1. Transfer to 2H-ICU for continued medical therapy, including diuresis.  Images reviewed in person with Dr. Vickey SagesAtkins with tentative plans for CABG tomorrow. 2. Restart heparin infusion in 2 hours. 3. Initiate furosemide 40 mg IV BID. 4. Start carvedilol 3.125 mg BID.  Defer adding ACEI/ARB until after surgery. 5. Aggressive secondary prevention, including high-intensity statin therapy and tobacco cessation. 6. Obtain echocardiogram.  Diagnostic Dominance: Right  Echocardiogram 06/23/20: 1. Left ventricular ejection fraction, by estimation, is 35 to 40%. The  left ventricle has moderately decreased function. The left ventricle  demonstrates regional wall motion abnormalities (see scoring  diagram/findings for description). Left ventricular  diastolic parameters are indeterminate. There is akinesis of the left  ventricular, entire anteroseptal wall. There is moderate hypokinesis of  the left ventricular, entire inferoseptal wall, anterior wall and apical  segment. There is severe hypokinesis  of the left ventricular, mid-apical anterior wall and inferior wall.  2. Right ventricular systolic function is mildly reduced. The right  ventricular size is normal. Tricuspid regurgitation signal is inadequate  for assessing PA pressure.  3. The mitral valve is normal in structure. Mild mitral valve  regurgitation. No  evidence of mitral stenosis.  4. The aortic valve is tricuspid. Aortic valve regurgitation is not  visualized. No aortic stenosis is present.  5. Aortic presence of intra-aortic balloon pump.  6. The inferior vena cava is normal in size with <50% respiratory  variability, suggesting right atrial pressure of 8 mmHg.   Limited echocardiogram 06/28/20: 1. Left ventricular ejection fraction, by estimation, is 40 to 45%. The  left ventricle has mildly decreased function. The left ventricle  demonstrates regional wall motion abnormalities (see scoring  diagram/findings for description). There is moderate  concentric left ventricular hypertrophy. Left ventricular diastolic  parameters are consistent with Grade  I diastolic dysfunction (impaired  relaxation). There is akinesis of the left ventricular, apical segment.  There is hypokinesis of the left  ventricular, mid inferoseptal wall. There is hypokinesis of the left  ventricular, apical septal wall.  2. Right ventricular systolic function is normal. The right ventricular  size is normal.  3. The mitral valve is normal in structure. No evidence of mitral valve  regurgitation. No evidence of mitral stenosis.  4. The aortic valve is normal in structure. Aortic valve regurgitation is  not visualized. No aortic stenosis is present.  5. Compared to prior echo, LVF has improved. Recommend definity contrast  study to better delineate wall motiont and EF.    ASSESSMENT AND PLAN:  1. CAD s/p CABG: doing well without anginal complaints - Continue aspirin, plavix, and statin - Continue isordil given radial artery harvest graft  2. ICM/chronic combined CHF: GDMT wasn't initiated inpatient due to baseline bradycardia and soft blood pressures. He completed a week long course of lasix. He appears euvolemic on exam today and is without volume overload complaints - Plan to repeat an echocardiogram in 3 months to monitor for improvement in LV  function - Encouraged a low sodium diet and monitoring of daily weights - Will give an Rx for prn lasix 40mg  + potassium 20 mEq for weight gain of 3lbs overnight or 5lbs in 1 week  3. HTN: BP 124/62 today - Continue isordil - Could consider addition of BBlocker at follow-up if BP/HR will allow  4. HLD: LDL 123 06/24/20. He reported prior intolerance to lipitor, now tolerating crestor - Plan to repeat FLP/LFTs in 6 weeks.  - Continue Crestor  5. Tobacco abuse: he has not smoked since discharge. Congratulated on this accomplishment! - Continue to encourage abstinence  6. Left hand pain: this is his primary complaint today. He describes constant sharp/buring pain in his hand, as well as numbness in the median nerve distribution since his left radial artery harvest. This interferes with his sleep. ?complex regional pain syndrome following radial artery harvest surgery.  - Will send a referral to ortho and OT   Current medicines are reviewed at length with the patient today.  The patient does not have concerns regarding medicines.  The following changes have been made:  As above  Labs/ tests ordered today include:   Orders Placed This Encounter  Procedures  . Lipid panel  . Hepatic function panel  . Ambulatory referral to Occupational Therapy  . AMB referral to orthopedics  . EKG 12-Lead  . ECHOCARDIOGRAM COMPLETE     Disposition:   FU with Dr. 08/24/20 in 3 months  Signed, Jens Som, PA-C  07/14/2020 3:53 PM

## 2020-07-14 ENCOUNTER — Ambulatory Visit (INDEPENDENT_AMBULATORY_CARE_PROVIDER_SITE_OTHER): Payer: Self-pay | Admitting: Medical

## 2020-07-14 ENCOUNTER — Encounter: Payer: Self-pay | Admitting: Medical

## 2020-07-14 ENCOUNTER — Other Ambulatory Visit: Payer: Self-pay

## 2020-07-14 VITALS — BP 124/62 | HR 82 | Ht 69.0 in | Wt 217.0 lb

## 2020-07-14 DIAGNOSIS — I255 Ischemic cardiomyopathy: Secondary | ICD-10-CM

## 2020-07-14 DIAGNOSIS — R200 Anesthesia of skin: Secondary | ICD-10-CM

## 2020-07-14 DIAGNOSIS — M79642 Pain in left hand: Secondary | ICD-10-CM

## 2020-07-14 DIAGNOSIS — Z720 Tobacco use: Secondary | ICD-10-CM

## 2020-07-14 DIAGNOSIS — I2510 Atherosclerotic heart disease of native coronary artery without angina pectoris: Secondary | ICD-10-CM

## 2020-07-14 DIAGNOSIS — Z951 Presence of aortocoronary bypass graft: Secondary | ICD-10-CM

## 2020-07-14 DIAGNOSIS — E785 Hyperlipidemia, unspecified: Secondary | ICD-10-CM

## 2020-07-14 DIAGNOSIS — I5042 Chronic combined systolic (congestive) and diastolic (congestive) heart failure: Secondary | ICD-10-CM

## 2020-07-14 DIAGNOSIS — I10 Essential (primary) hypertension: Secondary | ICD-10-CM

## 2020-07-14 MED ORDER — FUROSEMIDE 40 MG PO TABS: 40.0000 mg | ORAL_TABLET | Freq: Every day | ORAL | 1 refills | Status: DC | PRN

## 2020-07-14 MED ORDER — POTASSIUM CHLORIDE CRYS ER 20 MEQ PO TBCR: 20.0000 meq | EXTENDED_RELEASE_TABLET | Freq: Every day | ORAL | 1 refills | Status: DC | PRN

## 2020-07-14 NOTE — Patient Instructions (Signed)
Medication Instructions:   Take Furosemide 40 mg daily as needed for weight gain of 3 lbs overnight or 5 lbs in one week.  Take Potassium 20 mEq daily as needed when you take Furosemide.  *If you need a refill on your cardiac medications before your next appointment, please call your pharmacy*   Lab Work: Your physician recommends that you return for a FASTING lipid profile and Hepatic Function panel in 6 weeks.  If you have labs (blood work) drawn today and your tests are completely normal, you will receive your results only by: Marland Kitchen MyChart Message (if you have MyChart) OR . A paper copy in the mail If you have any lab test that is abnormal or we need to change your treatment, we will call you to review the results.   Testing/Procedures: Your physician has requested that you have an echocardiogram in 3 months---before your appointment with Dr. Jens Som. Echocardiography is a painless test that uses sound waves to create images of your heart. It provides your doctor with information about the size and shape of your heart and how well your heart's chambers and valves are working. This procedure takes approximately one hour. There are no restrictions for this procedure.   Follow-Up: At [facility], you and your health needs are our priority.  As part of our continuing mission to provide you with exceptional heart care, we have created designated Provider Care Teams.  These Care Teams include your primary Cardiologist (physician) and Advanced Practice Providers (APPs -  Physician Assistants and Nurse Practitioners) who all work together to provide you with the care you need, when you need it.  We recommend signing up for the patient portal called "MyChart".  Sign up information is provided on this After Visit Summary.  MyChart is used to connect with patients for Virtual Visits (Telemedicine).  Patients are able to view lab/test results, encounter notes, upcoming appointments, etc.  Non-urgent  messages can be sent to your provider as well.   To learn more about what you can do with MyChart, go to [email or web address].    Your next appointment:   3 month(s)  (After echocardiogram has been completed)  The format for your next appointment:   In Person  Provider:   You may see Olga Millers, MD or one of the following Advanced Practice Providers on your designated Care Team:    Corine Shelter, PA-C  [place], [place]  Edd Fabian, [place]    Other Instructions You have been referred to Orthopedics and Occupational Therapy. They will call you to schedule appointments.

## 2020-07-18 ENCOUNTER — Other Ambulatory Visit: Payer: Self-pay

## 2020-07-18 ENCOUNTER — Encounter: Payer: Self-pay | Admitting: Cardiothoracic Surgery

## 2020-07-18 ENCOUNTER — Ambulatory Visit (INDEPENDENT_AMBULATORY_CARE_PROVIDER_SITE_OTHER): Payer: Self-pay | Admitting: Cardiothoracic Surgery

## 2020-07-18 VITALS — BP 137/84 | HR 66 | Temp 98.2°F | Resp 20 | Ht 69.0 in | Wt 228.0 lb

## 2020-07-18 DIAGNOSIS — Z951 Presence of aortocoronary bypass graft: Secondary | ICD-10-CM

## 2020-07-18 MED ORDER — OXYCODONE HCL 5 MG PO TABS: 5.0000 mg | ORAL_TABLET | Freq: Three times a day (TID) | ORAL | 0 refills | Status: DC | PRN

## 2020-07-20 ENCOUNTER — Other Ambulatory Visit: Payer: Self-pay

## 2020-07-20 ENCOUNTER — Encounter (HOSPITAL_COMMUNITY): Payer: Self-pay | Admitting: Occupational Therapy

## 2020-07-20 ENCOUNTER — Ambulatory Visit (HOSPITAL_COMMUNITY): Payer: Self-pay | Attending: Medical | Admitting: Occupational Therapy

## 2020-07-20 DIAGNOSIS — R29898 Other symptoms and signs involving the musculoskeletal system: Secondary | ICD-10-CM | POA: Insufficient documentation

## 2020-07-20 DIAGNOSIS — R208 Other disturbances of skin sensation: Secondary | ICD-10-CM | POA: Insufficient documentation

## 2020-07-20 DIAGNOSIS — R278 Other lack of coordination: Secondary | ICD-10-CM | POA: Insufficient documentation

## 2020-07-20 DIAGNOSIS — M79632 Pain in left forearm: Secondary | ICD-10-CM | POA: Insufficient documentation

## 2020-07-20 DIAGNOSIS — M79642 Pain in left hand: Secondary | ICD-10-CM | POA: Insufficient documentation

## 2020-07-20 NOTE — Therapy (Signed)
[facility] [address] [place], [place], [address] Phone: [phone]   Fax:  [phone]  Occupational Therapy Evaluation  Patient Details  Name: [name] MRN: [id number] Date of Birth: 1981-05-27 Referring Provider (OT): Judy Pimple, [place]   Encounter Date: 07/20/2020   OT End of Session - 07/20/20 1005    Visit Number 1    Number of Visits 4    Date for OT Re-Evaluation 09/18/20    Authorization Type Self-pay    OT Start Time 0900    OT Stop Time 0940    OT Time Calculation (min) 40 min    Activity Tolerance Patient tolerated treatment well    Behavior During Therapy [facility] for tasks assessed/performed           Past Medical History:  Diagnosis Date  . CAD (coronary artery disease)    inferiot MI; LHC with EF 55% and basal to mid inferior hypokinesis. totally occluded RCA w/weak L-R collaterals. 50% ostial OM1. long 70% proximal LAD. Pt had promus DES to LAD and RCA. echo 9/11: EF 55%, no regional wall motion abnormalities   . GI bleed 9/11   secondary to mallory-weiss tear with endoscopic clipping   . Hyperlipidemia   . Hypertension   . Nephrolithiasis   . Transfusion history 2011/2012  . 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 CARDIAC SURGERY    . CORONARY ARTERY BYPASS GRAFT N/A 06/24/2020   Procedure: CORONARY ARTERY BYPASS GRAFTING (CABG) x 6, bilateral IMA, LIMA TO LAD, RIMA TO PDA, LEFT RADIAL ARTERY TO DIAG. 1 SEQ TO OM1, SVG TO OM2, SVG TO ACUTE MARGINAL;  Surgeon: Linden Dolin, MD;  Location: [facility] OR;  Service: Open Heart Surgery;  Laterality: N/A;  . ENDOVEIN HARVEST OF GREATER SAPHENOUS VEIN Right 06/24/2020   Procedure: ENDOVEIN HARVEST OF GREATER SAPHENOUS VEIN;  Surgeon: Linden Dolin, MD;  Location: [facility] OR;  Service: Open Heart Surgery;  Laterality: Right;  . IABP INSERTION N/A 06/23/2020    Procedure: IABP Insertion;  Surgeon: Yvonne Kendall, MD;  [facility]: [facility] INVASIVE CV LAB;  Service: Cardiovascular;  Laterality: N/A;  . LEFT HEART CATH AND CORONARY ANGIOGRAPHY N/A 06/23/2020   Procedure: LEFT HEART CATH AND CORONARY ANGIOGRAPHY;  Surgeon: Yvonne Kendall, MD;  [facility]: [facility] INVASIVE CV LAB;  Service: Cardiovascular;  Laterality: N/A;  . patients states had two stent surgery to his heart  03/30/2010  . RADIAL ARTERY HARVEST Left 06/24/2020   Procedure: RADIAL ARTERY HARVEST;  Surgeon: Linden Dolin, MD;  [facility]: [facility] OR;  Service: Open Heart Surgery;  Laterality: Left;  . TEE WITHOUT CARDIOVERSION N/A 06/24/2020   Procedure: TRANSESOPHAGEAL ECHOCARDIOGRAM (TEE);  Surgeon: Linden Dolin, MD;  Location: [facility] OR;  Service: Open Heart Surgery;  Laterality: N/A;    There were no vitals filed for this visit.   Subjective Assessment - 07/20/20 0958    Subjective  S: I've had hand and arm pain and numbness since my surgery.    Pertinent History Pt is a 39 y/o male s/p CABG on 06/24/20 with radial artery graft. Pt reports severe hand and forearm tingling/numbness and pain since surgery, has noticed shooting electrical shock type pains over the past 4-5 days. Pt was referred to occupational therapy by Lauretta Chester, PA-C.    Special Tests DASH: 68.18    Patient Stated Goals To have less pain  and more strength in my hand.    Currently in Pain? Yes    Pain Score 6     Pain Location Hand    Pain Orientation Left    Pain Descriptors / Indicators Tingling;Pins and needles    Pain Type Acute pain    Pain Radiating Towards N/A    Pain Onset 1 to 4 weeks ago    Pain Frequency Intermittent    Aggravating Factors  touching the skin    Pain Relieving Factors pain medication    Effect of Pain on Daily Activities mod to max effect on ADLs    Multiple Pain Sites No             [facility] OT Assessment - 07/20/20 0855      Assessment   Medical Diagnosis left hand pain/numbness s/p CABG     Referring Provider (OT) Judy Pimple, PA-C    Onset Date/Surgical Date 06/24/20    Hand Dominance Right    Next MD Visit 07/27/2020    Prior Therapy None      Precautions   Precautions Other (comment)    Precaution Comments s/p CABG      Restrictions   Weight Bearing Restrictions No      Balance Screen   Has the patient fallen in the past 6 months No    Has the patient had a decrease in activity level because of a fear of falling?  No    Is the patient reluctant to leave their home because of a fear of falling?  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  pt having difficulty with sleeping due to hand pain/tingling      Written Expression   Dominant Hand Right      Cognition   Overall Cognitive Status Within Functional Limits for tasks assessed      Sensation   Light Touch Impaired by gross assessment   hand region   Additional Comments Phoebe Perch: distal forearm and wrist normal; palm/thumb/dorsal hand are diminished light touch to diminished protective; 3rd and 4th digits are unresponsive       Coordination   9 Hole Peg Test Right;Left    Right 9 Hole Peg Test 24.92"    Left 9 Hole Peg Test 52.77"      ROM / Strength   AROM / PROM / Strength Strength      Palpation   Palpation comment min/mod scar restrictions along volar forearm incision site; pt with hypersensitivity along forearm and incision site      Strength   Strength Assessment Site Hand    Right/Left hand Right;Left    Right Hand Grip (lbs) 85    Right Hand Lateral Pinch 20 lbs    Right Hand 3 Point Pinch 19 lbs    Left Hand Grip (lbs) 24    Left Hand Lateral Pinch 4 lbs    Left Hand 3 Point Pinch 3 lbs               Neldon Mc -  07/20/20 0905    Open a tight or new jar Unable    Do heavy household chores (wash walls, wash floors)  Moderate difficulty    Carry a shopping bag or briefcase Mild difficulty    Wash your back Unable    Use a knife to cut food Unable    Recreational activities in which you take some force or impact through your arm, shoulder, or hand (golf, hammering, tennis) Unable    During the past week, to what extent has your arm, shoulder or hand problem interfered with your normal social activities with family, friends, neighbors, or groups? Slightly    During the past week, to what extent has your arm, shoulder or hand problem limited your work or other regular daily activities Modererately    Arm, shoulder, or hand pain. Moderate    Tingling (pins and needles) in your arm, shoulder, or hand Severe    Difficulty Sleeping Severe difficulty    DASH Score 68.18 %                      OT Education - 07/20/20 0953    Education Details educated on nerve regeneration and healing; provided desensitization strategies, yellow theraputty grip and pinch strengthening    Person(s) Educated Patient    Methods Explanation;Demonstration;Handout    Comprehension Verbalized understanding;Returned demonstration            OT Short Term Goals - 07/20/20 1400      OT SHORT TERM GOAL #1   Title Pt will be educated on and independent in HEP to improve use of LUE during ADL completion.    Time 4    Period Weeks    Status New    Target Date 08/19/20      OT SHORT TERM GOAL #2   Title Pt will increase left grip strength by 10# and pinch strength by 3# to improve ability to hold cups, plates, etc. during meals.    Time 4    Period Weeks    Status New      OT SHORT TERM GOAL #3   Title Pt will decrease pain in left forearm and hand to 5/10 to improve ability to use LUE as assist during functional task completion.    Time 4    Period Weeks    Status New      OT SHORT TERM GOAL #4   Title Pt  will decrease hypersensitivity in left forearm to improve tolerance to various fabrics and materials.    Time 4    Period Weeks    Status New             OT Long Term Goals - 07/20/20 1402      OT LONG TERM GOAL #1   Title Pt will increase left grip strength by 20# and pinch strength by 6# to improve ability to maintain a sustained grip on items during ADLs and hunting.    Time 8    Period Weeks    Status New    Target Date 09/18/20      OT LONG TERM GOAL #2   Title Pt will decrease pain in left hand and arm to 3/10 or less to improve ability to sleep 4 consecutive hours or more without waking due to pain.    Time 8    Period Weeks    Status New      OT LONG TERM GOAL #3   Title Pt will improve fine motor coordination in left hand by completing 9 hole peg test in under 30" to improve ability to manipulate objects during ADL  and work tasks.    Time 8    Period Weeks    Status New                 Plan - 07/20/20 1005    Clinical Impression Statement A: Pt is a 39 y/o male s/p CABG with radial artery graft on 06/24/20 presenting with decreased sensation and strength in left forearm and hand and increased pain, all present since surgery. Over the past 3-4 days pt has been experiencing electrical shock type pain daily, quick and then resolved. Educated pt on progression of nerve healing and regeneration.    OT Occupational Profile and History Problem Focused Assessment - Including review of records relating to presenting problem    Occupational performance deficits (Please refer to evaluation for details): ADL's;IADL's;Rest and Sleep;Work;Leisure    Body Structure / Function / Physical Skills ADL;Endurance;UE functional use;Fascial restriction;Pain;Coordination;Proprioception;Sensation;IADL;Strength;Edema;Dexterity    Rehab Potential Good    Clinical Decision Making Limited treatment options, no task modification necessary    Comorbidities Affecting Occupational Performance:  None    Modification or Assistance to Complete Evaluation  No modification of tasks or assist necessary to complete eval    OT Frequency 1x / week    OT Duration 8 weeks    OT Treatment/Interventions Self-care/ADL training;Ultrasound;Patient/family education;Scar mobilization;Passive range of motion;Cryotherapy;Electrical Stimulation;Moist Heat;Therapeutic exercise;Manual Therapy;Therapeutic activities    Plan P: Pt will benefit from skilled OT services to decrease pain and improve functioning of left hand. Treatment plan: manual techniques, desensitization strategies/techniques, grip and pinch strengthening, fine motor coordination tasks, modalities prn, update HEP each visit as pt is self-pay and only attending every other week    OT Home Exercise Plan 10/6: desensitization techniques, yellow theraputty grip and pinch strengthening    Consulted and Agree with Plan of Care Patient           Patient will benefit from skilled therapeutic intervention in order to improve the following deficits and impairments:   Body Structure / Function / Physical Skills: ADL, Endurance, UE functional use, Fascial restriction, Pain, Coordination, Proprioception, Sensation, IADL, Strength, Edema, Dexterity       Visit Diagnosis: Pain in left forearm  Pain in left hand  Other symptoms and signs involving the musculoskeletal system  Other lack of coordination  Decreased sensation of hand and arm    Problem List Patient Active Problem List   Diagnosis Date Noted  . Ischemic cardiomyopathy 06/28/2020  . Acute systolic heart failure (HCC) 06/28/2020  . 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 Coronary artery disease 06/24/2020  . NSTEMI (non-ST elevated myocardial infarction) (HCC) 06/23/2020  . Hyperlipidemia 05/02/2011  . Esophagitis 05/02/2011  . CORONARY ATHEROSCLEROSIS NATIVE CORONARY ARTERY 04/07/2010   [name], [name]  [phone] 07/20/2020, 2:16 PM  [facility] [address] [place], [place], [address] Phone: [phone]   Fax:  [phone]  Name: [name] MRN: [id number] Date of Birth: 1981/06/17

## 2020-07-20 NOTE — Progress Notes (Signed)
39 year old man is status post CABG and returns for second follow-up visit.  He reports residual numbness in the area of the left radial artery harvest.  Otherwise he has been doing well.  Physical exam: Well-appearing gentleman no acute distress BP 137/84 (BP Location: Right Arm, Patient Position: Sitting, Cuff Size: Normal)   Pulse 66   Temp 98.2 F (36.8 C)   Resp 20   Ht 5\' 9"  (1.753 m)   Wt 103.4 kg   SpO2 99% Comment: RA  BMI 33.67 kg/m   Chest clear to auscultation Heart: Regular rate and rhythm Abdomen: Soft nontender Extremities: No edema   Imaging: No new tests  impression: Doing well after CABG for low ejection fraction   Plan: Continue present management Follow-up with cardiology Strict diabetes control Okay to work in 2 weeks depending upon his work availability  okay to drive now  . [employer], MD [phone]

## 2020-07-20 NOTE — Patient Instructions (Signed)
Home Exercises Program Theraputty Exercises  Do the following exercises 2-3 times a day using your affected hand.  1. Roll putty into a ball.  2. Make into a pancake.  3. Roll putty into a roll.  4. Pinch along log with first finger and thumb.   5. Make into a ball.  6. Roll it back into a log.   7. Pinch using thumb and side of first finger.  8. Roll into a ball, then flatten into a pancake.  9. Using your fingers, make putty into a mountain.  10. Roll putty back into a ball and squeeze gently for 2-3 minutes.   

## 2020-07-27 ENCOUNTER — Other Ambulatory Visit: Payer: Self-pay

## 2020-07-27 ENCOUNTER — Ambulatory Visit (INDEPENDENT_AMBULATORY_CARE_PROVIDER_SITE_OTHER): Payer: Self-pay | Admitting: Orthopedic Surgery

## 2020-07-27 ENCOUNTER — Ambulatory Visit (HOSPITAL_COMMUNITY): Payer: Self-pay | Admitting: Specialist

## 2020-07-27 ENCOUNTER — Encounter: Payer: Self-pay | Admitting: Orthopedic Surgery

## 2020-07-27 VITALS — BP 121/83 | HR 94 | Ht 69.0 in | Wt 225.0 lb

## 2020-07-27 DIAGNOSIS — G5612 Other lesions of median nerve, left upper limb: Secondary | ICD-10-CM

## 2020-07-27 MED ORDER — GABAPENTIN 100 MG PO CAPS: 100.0000 mg | ORAL_CAPSULE | Freq: Three times a day (TID) | ORAL | 2 refills | Status: DC

## 2020-07-27 NOTE — Progress Notes (Signed)
New Patient Visit  Assessment: [name] is a 39 y.o. male with the following: 1.  Left median nerve irritation s/p radial artery harvest for coronary bypass surgery (DOS: 06/24/20)  Plan: [name] has symptoms consistent with carpal tunnel syndrome with onset immediately following his left radial artery harvest for coronary bypass surgery.  For the past month, his symptoms have improved, but he continues to have dense numbness and radiating pains within the median nerve distribution.  This is not uncommon following this procedure, with most patients experiencing complete resolution within 3 to 6 months.  Occasionally, patients will continue to have some minor strength issues.  At this time, I do believe that he will continue to improve but it may take several more months.  In the interim, I provided him with a prescription for gabapentin to assist with the nerve pain.  I instructed him to let me know if it is helping, and he wishes to increase the dosage.  In addition, we have referred him for an EMG to serve as a baseline so we can continue to monitor his progression.  If he returns to clinic, and is having significant radiating pains, we can also entertain the idea of proceeding with a steroid injection.  At this point in time, he is not interested in a steroid injection, and I told him I would rather get the EMG before discussing the injection again.  Although I think it is important for him to return to work, there may be some safety concerns as he works as a Naval architect.  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  In addition, if he has decreased sensation and strength in his left hand, this may make it more difficult for him to complete all tasks.  I am happy to continue to discuss this with him, but I have encouraged him to continue working diligently with occupational therapy in an attempt to improve his strength and dexterity.   Follow-up: Return in about 2 months (around  09/26/2020).  Subjective:  Chief Complaint  Patient presents with   Hand Pain    surgery was over a month ago, 06/27/2020 around this date still having pain comes in left wrist and goes into hand. feels some numbness.     History of Present Illness: [name] is a 39 y.o.RHD male who has been referred to clinic today by Judy Pimple, PA-C for evaluation of left hand pain.  Approximately 1 month ago, he underwent coronary bypass surgery, at which time they completed a radial artery harvest.  Since his surgery, he said numbness, tingling and pain in his left hand.  He does think it is improved since discharge from the hospital, but the radiating pains in particular are causing him difficulty.  He notes the pain is worse at night.  He has been taking oxycodone for pain, which helps him get to sleep.  He then wakes up in the middle the night and usually takes another dose.  He has been evaluated by occupational therapy, but has not had an official therapy session yet.  He notes it is difficult to hold items, and has he has decreased sensation.  He states he either holds things to lightly, which results in dropping items, or holds them too tight, which damages some items.  In general, he is frustrated with the lack of progression, and his current symptoms.  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  Review of Systems: No fevers or chills No shortness of breath No nausea or vomiting  Medical History:  Past Medical History:  Diagnosis Date   CAD (coronary artery disease)    inferiot MI; LHC with EF 55% and basal to mid inferior hypokinesis. totally occluded RCA w/weak L-R collaterals. 50% ostial OM1. long 70% proximal LAD. Pt had promus DES to LAD and RCA. 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 1 SEQ TO OM1, SVG TO OM2, SVG TO ACUTE MARGINAL;  Surgeon: Linden Dolin, MD;  [facility]: [facility] OR;  Service: Open Heart Surgery;  Laterality: N/A;   ENDOVEIN HARVEST OF GREATER SAPHENOUS VEIN Right 06/24/2020   Procedure: ENDOVEIN HARVEST OF GREATER SAPHENOUS VEIN;  Surgeon: Linden Dolin, MD;  [facility]: [facility] OR;  Service: Open Heart Surgery;  Laterality: Right;   IABP INSERTION N/A 06/23/2020   Procedure: IABP Insertion;  Surgeon: Yvonne Kendall, MD;  [facility] INVASIVE CV LAB;  Service: Cardiovascular;  Laterality: N/A;   LEFT HEART CATH AND CORONARY ANGIOGRAPHY N/A 06/23/2020   Procedure: LEFT HEART CATH AND CORONARY ANGIOGRAPHY;  Surgeon: Yvonne Kendall, MD;  [facility] INVASIVE CV LAB;  Service: Cardiovascular;  Laterality: N/A;   patients states had two stent surgery to his heart  03/30/2010   RADIAL ARTERY HARVEST Left 06/24/2020   Procedure: RADIAL ARTERY HARVEST;  Surgeon: Linden Dolin, MD;  [facility]: [facility] OR;  Service: Open Heart Surgery;  Laterality: Left;   TEE WITHOUT CARDIOVERSION N/A 06/24/2020   Procedure: TRANSESOPHAGEAL ECHOCARDIOGRAM (TEE);  Surgeon: Linden Dolin, MD;  Location: [facility] OR;  Service: Open Heart Surgery;  Laterality: N/A;    Family History  Problem Relation Age of Onset   Rheum arthritis Father    Coronary artery disease Other        noncontributory for early CAD and MI   Cancer Mother    Rheum arthritis Mother    Diabetes Mother    Hyperlipidemia Mother    Hypertension Mother    Stroke Other    Social History   Tobacco Use   Smoking status:  Former Smoker    Packs/day: 1.50   Smokeless tobacco: Never Used   Tobacco comment: quit 9/11  Substance Use Topics   Alcohol use: Yes    Comment: occasional    Drug use: No    Allergies  Allergen Reactions   Penicillins Anaphylaxis, Swelling and Rash    "Rash from head to toe, throat closed up" with amoxicillin at 39 years old  PCN reaction causing immediate rash, facial/tongue/throat swelling, SOB or lightheadedness with hypotension: Yes Has patient had a PCN reaction causing severe rash involving mucus membranes or skin necrosis: Yes Has patient had a PCN reaction that required hospitalization: No Has patient had a PCN reaction occurring within the last 10 years: No If all of the above answers are "NO", then may proceed with Cephalosporin use.    Current  Meds  Medication Sig   amiodarone (PACERONE) 200 MG tablet Take 1 tablet (200 mg total) by mouth 2 (two) times daily. For 4 days then take 200 mg daily thereafter   aspirin EC 81 MG EC tablet Take 1 tablet (81 mg total) by mouth daily. Swallow whole.   clopidogrel (PLAVIX) 75 MG tablet Take 1 tablet (75 mg total) by mouth daily.   furosemide (LASIX) 40 MG tablet Take 1 tablet (40 mg total) by mouth daily as needed (for weight gain of 3 lbs overnight or 5 lbs in one week).   isosorbide dinitrate (ISORDIL) 10 MG tablet Take 1 tablet (10 mg total) by mouth 3 (three) times daily. Take until finished   LORazepam (ATIVAN) 0.5 MG tablet Take 0.5 mg by mouth 2 (two) times daily.   oxyCODONE (ROXICODONE) 5 MG immediate release tablet Take 1 tablet (5 mg total) by mouth every 8 (eight) hours as needed.   potassium chloride SA (KLOR-CON) 20 MEQ tablet Take 1 tablet (20 mEq total) by mouth daily as needed. When you take Furosemide   rosuvastatin (CRESTOR) 40 MG tablet Take 1 tablet (40 mg total) by mouth daily.    Objective: BP 121/83    Pulse 94    Ht 5\' 9"  (1.753 m)    Wt 225 lb (102.1 kg)    BMI 33.23 kg/m   Physical  Exam:  General: Alert and oriented, no acute distress Gait: Walks with a nonantalgic gait  Evaluation of the left upper extremity demonstrates a well-healed surgical incision without surrounding erythema or drainage. He has some decreased sensation over the volar aspect of the forearm. Positive Tinel's at the carpal tunnel. Decreased sensation to the thumb, index, long and radial aspect of the ring finger.  Sensation is intact to the small finger. 4/5 grip strength He does have full and painless range of motion. Active motion intact in the AIN/PIN and ulnar nerve distribution.    IMAGING:  No imaging to review   New Medications:  Meds ordered this encounter  Medications   gabapentin (NEURONTIN) 100 MG capsule    Sig: Take 1 capsule (100 mg total) by mouth 3 (three) times daily.    Dispense:  90 capsule    Refill:  2      , MD  07/27/2020 10:04 AM

## 2020-08-01 ENCOUNTER — Telehealth: Payer: Self-pay | Admitting: Orthopedic Surgery

## 2020-08-01 NOTE — Telephone Encounter (Signed)
Patient called (left a voice message, which was difficult to decipher due to poor cell phone signal. I called back to patient to review, and he relays he would like to "have the mg's upped" on medication, if possible, as it is working, but not well: gabapentin (NEURONTIN) 100 MG capsule 90 capsule  -[facility], [facility]  -patient aware of referral appointment scheduled/rescheduled, with Dr Alvester Morin.

## 2020-08-02 NOTE — Telephone Encounter (Signed)
I called the patient and he has a follow up with Dr. Alvester Morin on 08/16/2020. He is wanting to know if you would increase the medication until he can see Dr. Alvester Morin. [facility] [place] [facility]. Please advise.

## 2020-08-04 ENCOUNTER — Ambulatory Visit (HOSPITAL_COMMUNITY): Payer: Self-pay

## 2020-08-04 ENCOUNTER — Other Ambulatory Visit: Payer: Self-pay

## 2020-08-04 ENCOUNTER — Telehealth: Payer: Self-pay | Admitting: *Deleted

## 2020-08-04 ENCOUNTER — Telehealth (HOSPITAL_COMMUNITY): Payer: Self-pay

## 2020-08-04 ENCOUNTER — Encounter (HOSPITAL_COMMUNITY): Payer: Self-pay

## 2020-08-04 DIAGNOSIS — R278 Other lack of coordination: Secondary | ICD-10-CM

## 2020-08-04 DIAGNOSIS — M79642 Pain in left hand: Secondary | ICD-10-CM

## 2020-08-04 DIAGNOSIS — M79632 Pain in left forearm: Secondary | ICD-10-CM

## 2020-08-04 DIAGNOSIS — R208 Other disturbances of skin sensation: Secondary | ICD-10-CM

## 2020-08-04 DIAGNOSIS — R29898 Other symptoms and signs involving the musculoskeletal system: Secondary | ICD-10-CM

## 2020-08-04 NOTE — Therapy (Signed)
[facility] [address] [place], [place], [address] Phone: [phone]   Fax:  [phone]  Occupational Therapy Treatment  Patient Details  Name: [name] MRN: [id number] Date of Birth: 03-22-1981 Referring Provider (OT): Judy Pimple, [place]   Encounter Date: 08/04/2020   OT End of Session - 08/04/20 1051    Visit Number 2    Number of Visits 4    Date for OT Re-Evaluation 09/18/20    Authorization Type Self-pay    OT Start Time 1046   pt arrived late   OT Stop Time 1117    OT Time Calculation (min) 31 min    Activity Tolerance Patient tolerated treatment well    Behavior During Therapy [facility] for tasks assessed/performed           Past Medical History:  Diagnosis Date  . CAD (coronary artery disease)    inferiot MI; LHC with EF 55% and basal to mid inferior hypokinesis. totally occluded RCA w/weak L-R collaterals. 50% ostial OM1. long 70% proximal LAD. Pt had promus DES to LAD and RCA. echo 9/11: EF 55%, no regional wall motion abnormalities   . GI bleed 9/11   secondary to mallory-weiss tear with endoscopic clipping   . Hyperlipidemia   . Hypertension   . Nephrolithiasis   . Transfusion history 2011/2012  . 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 CARDIAC SURGERY    . CORONARY ARTERY BYPASS GRAFT N/A 06/24/2020   Procedure: CORONARY ARTERY BYPASS GRAFTING (CABG) x 6, bilateral IMA, LIMA TO LAD, RIMA TO PDA, LEFT RADIAL ARTERY TO DIAG. 1 SEQ TO OM1, SVG TO OM2, SVG TO ACUTE MARGINAL;  Surgeon: Linden Dolin, MD;  Location: [facility] OR;  Service: Open Heart Surgery;  Laterality: N/A;  . ENDOVEIN HARVEST OF GREATER SAPHENOUS VEIN Right 06/24/2020   Procedure: ENDOVEIN HARVEST OF GREATER SAPHENOUS VEIN;  Surgeon: Linden Dolin, MD;  Location: [facility] OR;  Service: Open Heart Surgery;  Laterality: Right;  . IABP INSERTION N/A  06/23/2020   Procedure: IABP Insertion;  Surgeon: Yvonne Kendall, MD;  [facility] INVASIVE CV LAB;  Service: Cardiovascular;  Laterality: N/A;  . LEFT HEART CATH AND CORONARY ANGIOGRAPHY N/A 06/23/2020   Procedure: LEFT HEART CATH AND CORONARY ANGIOGRAPHY;  Surgeon: Yvonne Kendall, MD;  [facility] INVASIVE CV LAB;  Service: Cardiovascular;  Laterality: N/A;  . patients states had two stent surgery to his heart  03/30/2010  . RADIAL ARTERY HARVEST Left 06/24/2020   Procedure: RADIAL ARTERY HARVEST;  Surgeon: Linden Dolin, MD;  [facility]: [facility] OR;  Service: Open Heart Surgery;  Laterality: Left;  . TEE WITHOUT CARDIOVERSION N/A 06/24/2020   Procedure: TRANSESOPHAGEAL ECHOCARDIOGRAM (TEE);  Surgeon: Linden Dolin, MD;  Location: [facility] OR;  Service: Open Heart Surgery;  Laterality: N/A;    There were no vitals filed for this visit.   Subjective Assessment - 08/04/20 1048    Subjective  S: I'm irritated with constantly dropping things, and the numbness I have    Currently in Pain? Yes    Pain Score 6     Pain Location Hand    Pain Orientation Left    Pain Descriptors / Indicators Numbness;Pins and needles    Pain Type Acute pain    Pain Radiating Towards n/a    Pain Onset 1 to 4 weeks ago    Pain Frequency  Intermittent    Aggravating Factors  touching the skin, arm being down by side for long periods of time    Pain Relieving Factors topical pain patches, heat    Effect of Pain on Daily Activities mod to max effect    Multiple Pain Sites No              [facility] OT Assessment - 08/04/20 1318      Assessment   Medical Diagnosis left hand pain/numbness s/p CABG      Precautions   Precautions Other (comment)    Precaution Comments s/p CABG      Restrictions   Weight Bearing Restrictions No                    OT Treatments/Exercises (OP) - 08/04/20 1103      Exercises   Exercises Wrist;Theraputty      Additional Wrist Exercises   Sponges 16    Hand Gripper  with Large Beads 7#, all large beads, horizontal placement of gripper, min difficulty    Hand Gripper with Medium Beads 7#, all medium beads, min difficulty       Theraputty   Theraputty - Flatten yellow- standing    Theraputty - Roll yellow    Theraputty - Grip yellow- pronated grip                  OT Education - 08/04/20 1259    Education Details Reviewed goals, discussed importance of completing HEP and desensitization strategies to promote progress in between therapy sessions    Person(s) Educated Patient    Methods Explanation    Comprehension Verbalized understanding            OT Short Term Goals - 08/04/20 1054      OT SHORT TERM GOAL #1   Title Pt will be educated on and independent in HEP to improve use of LUE during ADL completion.    Time 4    Period Weeks    Status On-going    Target Date 08/19/20      OT SHORT TERM GOAL #2   Title Pt will increase left grip strength by 10# and pinch strength by 3# to improve ability to hold cups, plates, etc. during meals.    Time 4    Period Weeks    Status On-going      OT SHORT TERM GOAL #3   Title Pt will decrease pain in left forearm and hand to 5/10 to improve ability to use LUE as assist during functional task completion.    Time 4    Period Weeks    Status On-going      OT SHORT TERM GOAL #4   Title Pt will decrease hypersensitivity in left forearm to improve tolerance to various fabrics and materials.    Time 4    Period Weeks    Status On-going             OT Long Term Goals - 08/04/20 1100      OT LONG TERM GOAL #1   Title Pt will increase left grip strength by 20# and pinch strength by 6# to improve ability to maintain a sustained grip on items during ADLs and hunting.    Time 8    Period Weeks    Status On-going      OT LONG TERM GOAL #2   Title Pt will decrease pain in left hand and arm to 3/10 or less to  improve ability to sleep 4 consecutive hours or more without waking due to pain.     Time 8    Period Weeks    Status On-going      OT LONG TERM GOAL #3   Title Pt will improve fine motor coordination in left hand by completing 9 hole peg test in under 30" to improve ability to manipulate objects during ADL and work tasks.    Time 8    Period Weeks    Status On-going                 Plan - 08/04/20 1300    Clinical Impression Statement A: Patient presented with increased pain on radial side of the wrist up into the base of the palmar side of thumb, with pain decreasing upon theraputty activity. Patient reports that he hasn't done his HEP very much at home, but has found that the only thing that has helped the pain has been putting heat on his wrist with a warm towel. Introduced theraputty activity, gripping sponges and attempting bead gripper, with focus on grip strength this session. Patient reported increased numbness/tingling with motions that cause extension of the wrist, with recommendation to keep wrist in neutral position and education was provided why decreased sensitivity would cause deficits with grip function and increased discomfort.    Body Structure / Function / Physical Skills ADL;Endurance;UE functional use;Fascial restriction;Pain;Coordination;Proprioception;Sensation;IADL;Strength;Edema;Dexterity    Plan P: Update HEP, attempt [place] if pain is present as patient reports heat really helps the pain, continue grip/pinch strength and desensitization techniques    Consulted and Agree with Plan of Care Patient           Patient will benefit from skilled therapeutic intervention in order to improve the following deficits and impairments:   Body Structure / Function / Physical Skills: ADL, Endurance, UE functional use, Fascial restriction, Pain, Coordination, Proprioception, Sensation, IADL, Strength, Edema, Dexterity       Visit Diagnosis: Pain in left forearm  Pain in left hand  Other symptoms and signs involving the musculoskeletal system  Other  lack of coordination  Decreased sensation of hand and arm    Problem List Patient Active Problem List   Diagnosis Date Noted  . Ischemic cardiomyopathy 06/28/2020  . Acute systolic heart failure (HCC) 06/28/2020  . 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 Coronary artery disease 06/24/2020  . NSTEMI (non-ST elevated myocardial infarction) (HCC) 06/23/2020  . Hyperlipidemia 05/02/2011  . Esophagitis 05/02/2011  . CORONARY ATHEROSCLEROSIS NATIVE CORONARY ARTERY 04/07/2010     [place], [place] Student [phone]   08/04/2020, 1:26 PM  [place] [facility] [address] [place], [place], [address] Phone: [phone]   Fax:  [phone]  Name: [name] MRN: [id number] Date of Birth: 1980-10-22

## 2020-08-04 NOTE — Telephone Encounter (Signed)
Spoke with patient to inform him of scheduling error, and discussed updated appointments to being every other week per therapy plan and recommendations. Patient was informed of which appointments were kept and cancelled, with patient updating appointment sheet at home accordingly. Patient also informed of no-show policy.    [address] [place], [place] Student [phone]

## 2020-08-04 NOTE — Telephone Encounter (Signed)
[name] called requesting a refill of his Oxycodone stating he takes two pills each night to help him sleep through pain he is experiencing in his operative arm. He states he is currently undergoing treatment for nerve damage elsewhere. I advised [name] to try to wean himself off the Oxycodone by taking Tylenol in which he states that it does not help with his pain. I informed him that Dr. Vickey Sages is currently out of town this week but a message for medication refill will be sent to him. Pt is accepting.

## 2020-08-08 ENCOUNTER — Telehealth: Payer: Self-pay | Admitting: *Deleted

## 2020-08-08 NOTE — Telephone Encounter (Signed)
Followed up with [name] regarding his request for an Oxycodone refill. Per Dr. Vickey Sages, he needs to contact his PCP for pain management. Pt acknowledges receipt.

## 2020-08-09 ENCOUNTER — Ambulatory Visit (HOSPITAL_COMMUNITY): Payer: Self-pay

## 2020-08-12 ENCOUNTER — Telehealth: Payer: Self-pay | Admitting: Cardiology

## 2020-08-12 NOTE — Telephone Encounter (Signed)
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 Patient's boss would need a note to return to work. He was not sure if he needs a note from Dr. Jens Som or [facility] surgeon.   Routed to MD/RN

## 2020-08-12 NOTE — Telephone Encounter (Signed)
Patient states he had a bypass on 06/24/20 and he would like to know when he is eligible to return to work. Please advise.

## 2020-08-12 NOTE — Telephone Encounter (Signed)
Needs to touch base with [facility]; decision will be based on stability of sternotomy Olga Millers

## 2020-08-15 ENCOUNTER — Other Ambulatory Visit: Payer: Self-pay | Admitting: Physician Assistant

## 2020-08-15 NOTE — Telephone Encounter (Signed)
Patient called and wants to know if you are going to increase his Gabapentin dose? Patient sees Dr. Alvester Morin 08/16/2020. Please advise.

## 2020-08-15 NOTE — Telephone Encounter (Signed)
Spoke with pt, Aware of dr crenshaw's recommendations.  °

## 2020-08-16 ENCOUNTER — Encounter: Payer: Self-pay | Admitting: Physical Medicine and Rehabilitation

## 2020-08-16 ENCOUNTER — Encounter (HOSPITAL_COMMUNITY): Payer: Self-pay | Admitting: Occupational Therapy

## 2020-08-16 ENCOUNTER — Other Ambulatory Visit: Payer: Self-pay

## 2020-08-16 ENCOUNTER — Ambulatory Visit (HOSPITAL_COMMUNITY): Payer: Self-pay | Attending: Medical | Admitting: Occupational Therapy

## 2020-08-16 DIAGNOSIS — R278 Other lack of coordination: Secondary | ICD-10-CM | POA: Insufficient documentation

## 2020-08-16 DIAGNOSIS — R29898 Other symptoms and signs involving the musculoskeletal system: Secondary | ICD-10-CM | POA: Insufficient documentation

## 2020-08-16 DIAGNOSIS — R208 Other disturbances of skin sensation: Secondary | ICD-10-CM | POA: Insufficient documentation

## 2020-08-16 DIAGNOSIS — M79642 Pain in left hand: Secondary | ICD-10-CM | POA: Insufficient documentation

## 2020-08-16 DIAGNOSIS — M79632 Pain in left forearm: Secondary | ICD-10-CM | POA: Insufficient documentation

## 2020-08-16 MED ORDER — GABAPENTIN 300 MG PO CAPS: 300.0000 mg | ORAL_CAPSULE | Freq: Three times a day (TID) | ORAL | 0 refills | Status: DC

## 2020-08-16 NOTE — Telephone Encounter (Signed)
Updated prescription to 300 mg TID.  Prescription sent to pharmacy in chart.  If patient calls back, please remind him to let us know when the EMG is scheduled so we can schedule appropriate follow up in clinic.   Thanks   Faithanne Verret A. Dallas Schimke, MD MS [facility] [address] [place],  [place]  [address] Phone: [phone] Fax: [phone]

## 2020-08-16 NOTE — Therapy (Signed)
[facility] [address] [place], [place], [address] Phone: [phone]   Fax:  [phone]  Occupational Therapy Treatment  Patient Details  Name: OTONIEL MYHAND MRN: [id number] Date of Birth: 02-24-1981 Referring Provider (OT): Judy Pimple, [place]   Encounter Date: 08/16/2020   OT End of Session - 08/16/20 1057    Visit Number 3    Number of Visits 4    Date for OT Re-Evaluation 09/18/20    Authorization Type Self-pay    OT Start Time 1001   pt arrived late   OT Stop Time 1027    OT Time Calculation (min) 26 min    Activity Tolerance Patient tolerated treatment well    Behavior During Therapy [facility] for tasks assessed/performed           Past Medical History:  Diagnosis Date  . CAD (coronary artery disease)    inferiot MI; LHC with EF 55% and basal to mid inferior hypokinesis. totally occluded RCA w/weak L-R collaterals. 50% ostial OM1. long 70% proximal LAD. Pt had promus DES to LAD and RCA. echo 9/11: EF 55%, no regional wall motion abnormalities   . GI bleed 9/11   secondary to mallory-weiss tear with endoscopic clipping   . Hyperlipidemia   . Hypertension   . Nephrolithiasis   . Transfusion history 2011/2012  . 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 CARDIAC SURGERY    . CORONARY ARTERY BYPASS GRAFT N/A 06/24/2020   Procedure: CORONARY ARTERY BYPASS GRAFTING (CABG) x 6, bilateral IMA, LIMA TO LAD, RIMA TO PDA, LEFT RADIAL ARTERY TO DIAG. 1 SEQ TO OM1, SVG TO OM2, SVG TO ACUTE MARGINAL;  Surgeon: Linden Dolin, MD;  [facility] OR;  Service: Open Heart Surgery;  Laterality: N/A;  . ENDOVEIN HARVEST OF GREATER SAPHENOUS VEIN Right 06/24/2020   Procedure: ENDOVEIN HARVEST OF GREATER SAPHENOUS VEIN;  Surgeon: Linden Dolin, MD;  [facility]: [facility] OR;  Service: Open Heart Surgery;  Laterality: Right;  . IABP INSERTION N/A  06/23/2020   Procedure: IABP Insertion;  Surgeon: Yvonne Kendall, MD;  [facility] INVASIVE CV LAB;  Service: Cardiovascular;  Laterality: N/A;  . LEFT HEART CATH AND CORONARY ANGIOGRAPHY N/A 06/23/2020   Procedure: LEFT HEART CATH AND CORONARY ANGIOGRAPHY;  Surgeon: Yvonne Kendall, MD;  [facility] INVASIVE CV LAB;  Service: Cardiovascular;  Laterality: N/A;  . patients states had two stent surgery to his heart  03/30/2010  . RADIAL ARTERY HARVEST Left 06/24/2020   Procedure: RADIAL ARTERY HARVEST;  Surgeon: Linden Dolin, MD;  [facility]: [facility] OR;  Service: Open Heart Surgery;  Laterality: Left;  . TEE WITHOUT CARDIOVERSION N/A 06/24/2020   Procedure: TRANSESOPHAGEAL ECHOCARDIOGRAM (TEE);  Surgeon: Linden Dolin, MD;  Location: [facility] OR;  Service: Open Heart Surgery;  Laterality: N/A;    There were no vitals filed for this visit.   Subjective Assessment - 08/16/20 1003    Subjective  S: I have a nerve test on 11/11.    Currently in Pain? Yes    Pain Score 5     Pain Location Hand    Pain Orientation Left    Pain Descriptors / Indicators Numbness;Pins and needles    Pain Type Acute pain    Pain Radiating Towards n/a    Pain Onset More than a month ago    Pain Frequency Intermittent  Aggravating Factors  touching the skin    Pain Relieving Factors horse lintiment    Effect of Pain on Daily Activities mod effect on ADLs              [facility] OT Assessment - 08/16/20 1003      Assessment   Medical Diagnosis left hand pain/numbness s/p CABG      Precautions   Precautions Other (comment)    Precaution Comments s/p CABG      Restrictions   Weight Bearing Restrictions No                    OT Treatments/Exercises (OP) - 08/16/20 1005      Exercises   Exercises Wrist;Theraputty;Hand      Additional Wrist Exercises   Sponges 18, 20, 20    Hand Gripper with Large Beads all beads, gripper at 25#    Hand Gripper with Medium Beads all beads, gripper at 25#     Hand Gripper with Small Beads all beads, gripper at 25#      Hand Exercises   Other Hand Exercises Pt using red clothespin to stack 3 piles of 5 sponges working on 3 point pinch. Slightly increased time for task.       Fine Motor Coordination (Hand/Wrist)   Fine Motor Coordination Manipulating coins    Manipulating coins pt holding coins in palm and working on palm to fingertip translation. Increased time, using vision to compensate for lack of sensation.                     OT Short Term Goals - 08/04/20 1054      OT SHORT TERM GOAL #1   Title Pt will be educated on and independent in HEP to improve use of LUE during ADL completion.    Time 4    Period Weeks    Status On-going    Target Date 08/19/20      OT SHORT TERM GOAL #2   Title Pt will increase left grip strength by 10# and pinch strength by 3# to improve ability to hold cups, plates, etc. during meals.    Time 4    Period Weeks    Status On-going      OT SHORT TERM GOAL #3   Title Pt will decrease pain in left forearm and hand to 5/10 to improve ability to use LUE as assist during functional task completion.    Time 4    Period Weeks    Status On-going      OT SHORT TERM GOAL #4   Title Pt will decrease hypersensitivity in left forearm to improve tolerance to various fabrics and materials.    Time 4    Period Weeks    Status On-going             OT Long Term Goals - 08/04/20 1100      OT LONG TERM GOAL #1   Title Pt will increase left grip strength by 20# and pinch strength by 6# to improve ability to maintain a sustained grip on items during ADLs and hunting.    Time 8    Period Weeks    Status On-going      OT LONG TERM GOAL #2   Title Pt will decrease pain in left hand and arm to 3/10 or less to improve ability to sleep 4 consecutive hours or more without waking due to pain.    Time 8  Period Weeks    Status On-going      OT LONG TERM GOAL #3   Title Pt will improve fine motor  coordination in left hand by completing 9 hole peg test in under 30" to improve ability to manipulate objects during ADL and work tasks.    Time 8    Period Weeks    Status On-going                 Plan - 08/16/20 1022    Clinical Impression Statement A: Pt reports no changes in pain or sensation. Pt is in the hand, not along incision area, increased with touching thumb and fingertips. Pt completing grip strengthening with hand gripper, able to complete all bead sizes. Also completing fine motor coordination tasks, using vision for compesnating for lack of sensation. Also completing pinch strengthening. Verbal cuing for form and technique.    Body Structure / Function / Physical Skills ADL;Endurance;UE functional use;Fascial restriction;Pain;Coordination;Proprioception;Sensation;IADL;Strength;Edema;Dexterity    Plan P: Reassessment. Follow up on nerve conduction study, continue with grip and pinch strengthening.    OT Home Exercise Plan 10/6: desensitization techniques, yellow theraputty grip and pinch strengthening    Consulted and Agree with Plan of Care Patient           Patient will benefit from skilled therapeutic intervention in order to improve the following deficits and impairments:   Body Structure / Function / Physical Skills: ADL, Endurance, UE functional use, Fascial restriction, Pain, Coordination, Proprioception, Sensation, IADL, Strength, Edema, Dexterity       Visit Diagnosis: Pain in left forearm  Pain in left hand  Other symptoms and signs involving the musculoskeletal system  Other lack of coordination  Decreased sensation of hand and arm    Problem List Patient Active Problem List   Diagnosis Date Noted  . Ischemic cardiomyopathy 06/28/2020  . Acute systolic heart failure (HCC) 06/28/2020  . 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 Coronary artery disease 06/24/2020  . NSTEMI (non-ST elevated myocardial infarction) (HCC) 06/23/2020  . Hyperlipidemia  05/02/2011  . Esophagitis 05/02/2011  . CORONARY ATHEROSCLEROSIS NATIVE CORONARY ARTERY 04/07/2010   Ezra Sites, OTR/L  [phone] 08/16/2020, 10:59 AM  [facility] [address] [place], [place], [address] Phone: [phone]   Fax:  [phone]  Name: TRYGVE THAL MRN: [id number] Date of Birth: Dec 25, 1980

## 2020-08-23 ENCOUNTER — Other Ambulatory Visit: Payer: Self-pay | Admitting: Physician Assistant

## 2020-08-24 ENCOUNTER — Encounter (HOSPITAL_COMMUNITY): Payer: Self-pay | Admitting: Occupational Therapy

## 2020-08-25 ENCOUNTER — Encounter: Payer: Self-pay | Admitting: Physical Medicine and Rehabilitation

## 2020-08-25 ENCOUNTER — Ambulatory Visit (INDEPENDENT_AMBULATORY_CARE_PROVIDER_SITE_OTHER): Payer: Self-pay | Admitting: Physical Medicine and Rehabilitation

## 2020-08-25 ENCOUNTER — Other Ambulatory Visit: Payer: Self-pay

## 2020-08-25 DIAGNOSIS — R202 Paresthesia of skin: Secondary | ICD-10-CM

## 2020-08-25 LAB — LIPID PANEL
Chol/HDL Ratio: 3.1 ratio (ref 0.0–5.0)
Cholesterol, Total: 171 mg/dL (ref 100–199)
HDL: 55 mg/dL (ref >39)
LDL Chol Calc (NIH): 87 mg/dL (ref 0–99)
Triglycerides: 171 mg/dL — ABNORMAL HIGH (ref 0–149)
VLDL Cholesterol Cal: 29 mg/dL (ref 5–40)

## 2020-08-25 LAB — HEPATIC FUNCTION PANEL
ALT: 36 IU/L (ref 0–44)
AST: 21 IU/L (ref 0–40)
Albumin: 4.3 g/dL (ref 4.0–5.0)
Alkaline Phosphatase: 75 IU/L (ref 44–121)
Bilirubin Total: 0.2 mg/dL (ref 0.0–1.2)
Bilirubin, Direct: 0.1 mg/dL (ref 0.00–0.40)
Total Protein: 7 g/dL (ref 6.0–8.5)

## 2020-08-25 NOTE — Progress Notes (Signed)
DOROTHY LANDGREBE - 39 y.o. male MRN [id number]  Date of birth: [birth year]  Office Visit Note: Visit Date: 08/25/2020 PCP: April Manson, NP Referred by: April Manson, NP  Subjective: Chief Complaint  Patient presents with  . Left Hand - Numbness   HPI:  QUINCY BOY is a 39 y.o. male who comes in today at the request of Dr. Thane Edu for electrodiagnostic study of the Left upper extremities.  Patient is Right hand dominant.  Patient reports symptoms in a fairly classic median nerve distribution in the radial digits that started abruptly after open heart surgery, CABG x6,  that did consist of a radial artery harvesting.  This was on June 24, 2020.  Patient with no prior symptoms of numbness or tingling.  No prior symptoms of carpal tunnel syndrome or no complaints of frank cervical radiculopathy.  No diabetes history.  Patient has no prior electrodiagnostic study.  ROS Otherwise per HPI.  Assessment & Plan: Visit Diagnoses:  1. Paresthesia of skin     Plan: Impression: Essentially NORMAL electrodiagnostic study of the left upper limb.  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  This would portend a good outcome and he is getting somewhat better with time.  There is no significant electrodiagnostic evidence of nerve entrapment, brachial plexopathy or cervical radiculopathy.    As you know, purely sensory or demyelinating radiculopathies and chemical radiculitis may not be detected with this particular electrodiagnostic study.  Recommendations: 1.  Follow-up with referring physician. 2.  Continue current management of symptoms.  Consider repeat studies if symptoms persist or worsen.   Meds & Orders: No orders of the defined types were placed in this  encounter.   Orders Placed This Encounter  Procedures  . NCV with EMG (electromyography)    Follow-up: Return for Thane Edu, MD.   Procedures: No procedures performed  EMG & NCV Findings: All nerve conduction studies (as indicated in the following tables) were within normal limits.    All examined muscles (as indicated in the following table) showed no evidence of electrical instability.    Impression: Essentially NORMAL electrodiagnostic study of the left upper limb.  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  This would portend a good outcome and he is getting somewhat better with time.  There is no significant electrodiagnostic evidence of nerve entrapment, brachial plexopathy or cervical radiculopathy.    As you know, purely sensory or demyelinating radiculopathies and chemical radiculitis may not be detected with this particular electrodiagnostic study.  Recommendations: 1.  Follow-up with referring physician. 2.  Continue current management of symptoms.  Consider repeat studies if symptoms persist or worsen.  ___________________________ Naaman Plummer FAAPMR Board Certified, American Board of Physical Medicine and Rehabilitation    Nerve Conduction Studies Anti Sensory Summary Table   Stim Site NR Peak (ms) Norm Peak (ms) P-T Amp (V) Norm P-T Amp Site1 Site2 Delta-P (ms) Dist (cm) Vel (m/s) Norm Vel (m/s)  Left Median Acr Palm Anti Sensory (2nd Digit)  30.7C  Wrist    3.4 <3.6 15.3 >10 Wrist Palm 1.6 0.0  Palm    1.8 <2.0 12.0         Left Radial Anti Sensory (Base 1st Digit)  32.3C  Wrist    2.3 <3.1 15.5  Wrist Base 1st Digit 2.3 0.0    Left Ulnar Anti Sensory (5th Digit)  31.9C  Wrist    3.3 <3.7 18.2 >15.0 Wrist 5th Digit 3.3 14.0 42 >38     Motor Summary Table   Stim Site NR Onset (ms) Norm Onset (ms) O-P Amp (mV) Norm O-P Amp Site1 Site2 Delta-0 (ms) Dist (cm) Vel (m/s) Norm Vel (m/s)  Left Median Motor (Abd Poll Brev)  32.5C  Wrist    3.3 <4.2 8.4 >5 Elbow Wrist 4.1 22.0 54 >50  Elbow    7.4  5.1         Left Ulnar Motor (Abd Dig Min)  32.7C  Wrist    3.0 <4.2 7.2 >3 B Elbow Wrist 3.6 21.0 58 >53  B Elbow    6.6  6.4  A Elbow B Elbow 1.0 10.0 100 >53  A Elbow    7.6  6.2          EMG   Side Muscle Nerve Root Ins Act Fibs Psw Amp Dur Poly Recrt Int Dennie Bible Comment  Left Abd Poll Brev Median C8-T1 Nml Nml Nml Nml Nml 0 Nml Nml   Left 1stDorInt Ulnar C8-T1 Nml Nml Nml Nml Nml 0 Nml Nml   Left PronatorTeres Median C6-7 Nml Nml Nml Nml Nml 0 Nml Nml   Left Biceps Musculocut C5-6 Nml Nml Nml Nml Nml 0 Nml Nml   Left Deltoid Axillary C5-6 Nml Nml Nml Nml Nml 0 Nml Nml     Nerve Conduction Studies Anti Sensory Left/Right Comparison   Stim Site L Lat (ms) R Lat (ms) L-R Lat (ms) L Amp (V) R Amp (V) L-R Amp (%) Site1 Site2 L Vel (m/s) R Vel (m/s) L-R Vel (m/s)  Median Acr Palm Anti Sensory (2nd Digit)  30.7C  Wrist 3.4   15.3   Wrist Palm     Palm 1.8   12.0         Radial Anti Sensory (Base 1st Digit)  32.3C  Wrist 2.3   15.5   Wrist Base 1st Digit     Ulnar Anti Sensory (5th Digit)  31.9C  Wrist 3.3   18.2   Wrist 5th Digit 42     Motor Left/Right Comparison   Stim Site L Lat (ms) R Lat (ms) L-R Lat (ms) L Amp (mV) R Amp (mV) L-R Amp (%) Site1 Site2 L Vel (m/s) R Vel (m/s) L-R Vel (m/s)  Median Motor (Abd Poll Brev)  32.5C  Wrist 3.3   8.4   Elbow Wrist 54    Elbow 7.4   5.1         Ulnar Motor (Abd Dig Min)  32.7C  Wrist 3.0   7.2   B Elbow Wrist 58    B Elbow 6.6   6.4   A Elbow B Elbow 100    A Elbow 7.6   6.2            Waveforms:             Clinical History: No specialty comments available.     Objective:  VS:  HT:    WT:   BMI:     BP:   HR: bpm  TEMP: ( )  RESP:  Physical  Exam Musculoskeletal:  General: No tenderness.     Comments: Inspection reveals no atrophy of the bilateral APB or FDI or hand intrinsics. There is no swelling, color changes, allodynia or dystrophic changes. There is 5 out of 5 strength in the bilateral wrist extension, finger abduction and long finger flexion.  Impaired sensation paresthesia in the left median nerve distribution including the radial part of the fourth digit.. There is a negative Hoffmann's test bilaterally.  Skin:    General: Skin is warm and dry.     Findings: No erythema or rash.  Neurological:     General: No focal deficit present.     Mental Status: He is alert and oriented to person, place, and time.     Sensory: No sensory deficit.     Motor: No weakness or abnormal muscle tone.     Coordination: Coordination normal.     Gait: Gait normal.  Psychiatric:        Mood and Affect: Mood normal.        Behavior: Behavior normal.        Thought Content: Thought content normal.      Imaging: No results found.

## 2020-08-25 NOTE — Progress Notes (Signed)
Numbness and tingling in left palm from center to thumb. First three fingers are numb. Started after having open heart surgery.  Right hand dominant No lotion per patient Numeric Pain Rating Scale and Functional Assessment Average Pain 5   In the last MONTH (on 0-10 scale) has pain interfered with the following?  1. General activity like being  able to carry out your everyday physical activities such as walking, climbing stairs, carrying groceries, or moving a chair?  Rating(8)

## 2020-08-26 ENCOUNTER — Other Ambulatory Visit: Payer: Self-pay | Admitting: Physician Assistant

## 2020-08-26 NOTE — Procedures (Signed)
EMG & NCV Findings: All nerve conduction studies (as indicated in the following tables) were within normal limits.    All examined muscles (as indicated in the following table) showed no evidence of electrical instability.    Impression: Essentially NORMAL electrodiagnostic study of the left upper limb.  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  This would portend a good outcome and he is getting somewhat better with time.  There is no significant electrodiagnostic evidence of nerve entrapment, brachial plexopathy or cervical radiculopathy.    As you know, purely sensory or demyelinating radiculopathies and chemical radiculitis may not be detected with this particular electrodiagnostic study.  Recommendations: 1.  Follow-up with referring physician. 2.  Continue current management of symptoms.  Consider repeat studies if symptoms persist or worsen.  ___________________________ Naaman Plummer FAAPMR Board Certified, American Board of Physical Medicine and Rehabilitation    Nerve Conduction Studies Anti Sensory Summary Table   Stim Site NR Peak (ms) Norm Peak (ms) P-T Amp (V) Norm P-T Amp Site1 Site2 Delta-P (ms) Dist (cm) Vel (m/s) Norm Vel (m/s)  Left Median Acr Palm Anti Sensory (2nd Digit)  30.7C  Wrist    3.4 <3.6 15.3 >10 Wrist Palm 1.6 0.0    Palm    1.8 <2.0 12.0         Left Radial Anti Sensory (Base 1st Digit)  32.3C  Wrist    2.3 <3.1 15.5  Wrist Base 1st Digit 2.3 0.0    Left Ulnar Anti Sensory (5th Digit)  31.9C  Wrist    3.3 <3.7 18.2 >15.0 Wrist 5th Digit 3.3 14.0 42 >38   Motor Summary Table   Stim Site NR Onset (ms) Norm Onset (ms) O-P Amp (mV) Norm O-P Amp Site1 Site2 Delta-0 (ms) Dist (cm) Vel (m/s) Norm Vel (m/s)  Left Median Motor (Abd  Poll Brev)  32.5C  Wrist    3.3 <4.2 8.4 >5 Elbow Wrist 4.1 22.0 54 >50  Elbow    7.4  5.1         Left Ulnar Motor (Abd Dig Min)  32.7C  Wrist    3.0 <4.2 7.2 >3 B Elbow Wrist 3.6 21.0 58 >53  B Elbow    6.6  6.4  A Elbow B Elbow 1.0 10.0 100 >53  A Elbow    7.6  6.2          EMG   Side Muscle Nerve Root Ins Act Fibs Psw Amp Dur Poly Recrt Int Dennie Bible Comment  Left Abd Poll Brev Median C8-T1 Nml Nml Nml Nml Nml 0 Nml Nml   Left 1stDorInt Ulnar C8-T1 Nml Nml Nml Nml Nml 0 Nml Nml   Left PronatorTeres Median C6-7 Nml Nml Nml Nml Nml 0 Nml Nml   Left Biceps Musculocut C5-6 Nml Nml Nml Nml Nml 0 Nml Nml   Left Deltoid Axillary C5-6 Nml Nml Nml Nml Nml 0 Nml Nml     Nerve Conduction Studies Anti Sensory Left/Right Comparison   Stim Site L Lat (ms) R Lat (ms) L-R Lat (ms) L Amp (V) R Amp (V) L-R Amp (%) Site1 Site2 L Vel (m/s) R Vel (m/s) L-R Vel (m/s)  Median Acr Palm Anti Sensory (2nd Digit)  30.7C  Wrist 3.4   15.3   Wrist Palm  Palm 1.8   12.0         Radial Anti Sensory (Base 1st Digit)  32.3C  Wrist 2.3   15.5   Wrist Base 1st Digit     Ulnar Anti Sensory (5th Digit)  31.9C  Wrist 3.3   18.2   Wrist 5th Digit 42     Motor Left/Right Comparison   Stim Site L Lat (ms) R Lat (ms) L-R Lat (ms) L Amp (mV) R Amp (mV) L-R Amp (%) Site1 Site2 L Vel (m/s) R Vel (m/s) L-R Vel (m/s)  Median Motor (Abd Poll Brev)  32.5C  Wrist 3.3   8.4   Elbow Wrist 54    Elbow 7.4   5.1         Ulnar Motor (Abd Dig Min)  32.7C  Wrist 3.0   7.2   B Elbow Wrist 58    B Elbow 6.6   6.4   A Elbow B Elbow 100    A Elbow 7.6   6.2            Waveforms:

## 2020-08-31 ENCOUNTER — Ambulatory Visit (HOSPITAL_COMMUNITY): Payer: Self-pay | Admitting: Occupational Therapy

## 2020-08-31 ENCOUNTER — Other Ambulatory Visit: Payer: Self-pay | Admitting: *Deleted

## 2020-08-31 ENCOUNTER — Encounter (HOSPITAL_COMMUNITY): Payer: Self-pay | Admitting: Occupational Therapy

## 2020-08-31 ENCOUNTER — Other Ambulatory Visit: Payer: Self-pay

## 2020-08-31 DIAGNOSIS — M79632 Pain in left forearm: Secondary | ICD-10-CM

## 2020-08-31 DIAGNOSIS — R208 Other disturbances of skin sensation: Secondary | ICD-10-CM

## 2020-08-31 DIAGNOSIS — R278 Other lack of coordination: Secondary | ICD-10-CM

## 2020-08-31 DIAGNOSIS — R29898 Other symptoms and signs involving the musculoskeletal system: Secondary | ICD-10-CM

## 2020-08-31 DIAGNOSIS — E785 Hyperlipidemia, unspecified: Secondary | ICD-10-CM

## 2020-08-31 DIAGNOSIS — M79642 Pain in left hand: Secondary | ICD-10-CM

## 2020-08-31 NOTE — Therapy (Signed)
[place], [place], [address] Phone: [phone]   Fax:  [phone]  Occupational Therapy Reassessment, Treatment, Discharge Summary  Patient Details  Name: [name] MRN: [id number] Date of Birth: Jul 19, 1981 Referring Provider (OT): Roby Lofts, [place]   Encounter Date: 08/31/2020   OT End of Session - 08/31/20 0935    Visit Number 4    Number of Visits 4    Date for OT Re-Evaluation 09/18/20    Authorization Type Self-pay    OT Start Time 0906   pt arrived late   OT Stop Time 0945    OT Time Calculation (min) 39 min    Activity Tolerance Patient tolerated treatment well    Behavior During Therapy [facility] for tasks assessed/performed           Past Medical History:  Diagnosis Date  . CAD (coronary artery disease)    inferiot MI; LHC with EF 55% and basal to mid inferior hypokinesis. totally occluded RCA w/weak L-R collaterals. 50% ostial OM1. long 70% proximal LAD. Pt had promus DES to LAD and RCA. echo 9/11: EF 55%, no regional wall motion abnormalities   . GI bleed 9/11   secondary to mallory-weiss tear with endoscopic clipping   . Hyperlipidemia   . Hypertension   . Nephrolithiasis   . Transfusion history 2011/2012  . 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 CARDIAC SURGERY    . CORONARY ARTERY BYPASS GRAFT N/A 06/24/2020   Procedure: CORONARY ARTERY BYPASS GRAFTING (CABG) x 6, bilateral IMA, LIMA TO LAD, RIMA TO PDA, LEFT RADIAL ARTERY TO DIAG. 1 SEQ TO OM1, SVG TO OM2, SVG TO ACUTE MARGINAL;  Surgeon: Wonda Olds, MD;  Location: [facility];  Service: Open Heart Surgery;  Laterality: N/A;  . ENDOVEIN HARVEST OF GREATER SAPHENOUS VEIN Right 06/24/2020   Procedure: ENDOVEIN HARVEST OF GREATER SAPHENOUS VEIN;  Surgeon: Wonda Olds, MD;  Location: [facility];  Service: Open Heart Surgery;   Laterality: Right;  . IABP INSERTION N/A 06/23/2020   Procedure: IABP Insertion;  Surgeon: Nelva Bush, MD;  Location: [facility];  Service: Cardiovascular;  Laterality: N/A;  . LEFT HEART CATH AND CORONARY ANGIOGRAPHY N/A 06/23/2020   Procedure: LEFT HEART CATH AND CORONARY ANGIOGRAPHY;  Surgeon: Nelva Bush, MD;  Location: [facility];  Service: Cardiovascular;  Laterality: N/A;  . patients states had two stent surgery to his heart  03/30/2010  . RADIAL ARTERY HARVEST Left 06/24/2020   Procedure: RADIAL ARTERY HARVEST;  Surgeon: Wonda Olds, MD;  Location: [facility];  Service: Open Heart Surgery;  Laterality: Left;  . TEE WITHOUT CARDIOVERSION N/A 06/24/2020   Procedure: TRANSESOPHAGEAL ECHOCARDIOGRAM (TEE);  Surgeon: Wonda Olds, MD;  Location: [facility];  Service: Open Heart Surgery;  Laterality: N/A;    There were no vitals filed for this visit.   Subjective Assessment - 08/31/20 0905    Subjective  S: I have a red spot on my hand that I don't know where it came from.    Currently in Pain? Yes    Pain Score 5     Pain Location Hand    Pain Orientation Left    Pain Descriptors / Indicators Numbness;Pins and needles    Pain Type Acute pain    Pain Radiating Towards N/A    Pain Onset More than  a month ago    Pain Frequency Intermittent    Aggravating Factors  touching the skin    Pain Relieving Factors horse lintiment    Effect of Pain on Daily Activities mod effect on ADLs    Multiple Pain Sites No              [facility] OT Assessment - 08/31/20 0905      Assessment   Medical Diagnosis left hand pain/numbness s/p CABG      Precautions   Precautions Other (comment)    Precaution Comments s/p CABG      Restrictions   Weight Bearing Restrictions No      Coordination   9 Hole Peg Test Left    Left 9 Hole Peg Test 43.2"   52.77" previous     Strength   Strength Assessment Site Hand    Right/Left hand Left    Left Hand Grip (lbs) 45   24 previous    Left Hand Lateral Pinch 14 lbs   4 previous   Left Hand 3 Point Pinch 5 lbs   3 previous                   OT Treatments/Exercises (OP) - 08/31/20 0915      Exercises   Exercises Wrist;Theraputty;Hand      Additional Wrist Exercises   Hand Gripper with Large Beads all beads, gripper at 42#    Hand Gripper with Medium Beads all beads, gripper at 35#    Hand Gripper with Small Beads all beads, gripper at 35#      Hand Exercises   Other Hand Exercises Pt using pvc pipe to cut circles into red theraputty.     Other Hand Exercises Pt using red clothespin to stack 4 piles of 5 sponges working on 3 point pinch. Slightly increased time for task.       Theraputty   Theraputty - Flatten red                  OT Education - 08/31/20 0935    Education Details 11/17: red theraputty, education on nerve regeneration, encouraged to use LUE as normally as possible    Person(s) Educated Patient    Methods Explanation;Demonstration    Comprehension Verbalized understanding;Returned demonstration            OT Short Term Goals - 08/31/20 0914      OT SHORT TERM GOAL #1   Title Pt will be educated on and independent in HEP to improve use of LUE during ADL completion.    Time 4    Period Weeks    Status Achieved    Target Date 08/19/20      OT SHORT TERM GOAL #2   Title Pt will increase left grip strength by 10# and pinch strength by 3# to improve ability to hold cups, plates, etc. during meals.    Time 4    Period Weeks    Status Partially Met      OT SHORT TERM GOAL #3   Title Pt will decrease pain in left forearm and hand to 5/10 to improve ability to use LUE as assist during functional task completion.    Time 4    Period Weeks    Status Achieved      OT SHORT TERM GOAL #4   Title Pt will decrease hypersensitivity in left forearm to improve tolerance to various fabrics and materials.    Time 4  Period Weeks    Status Achieved             OT Long  Term Goals - 08/31/20 0914      OT LONG TERM GOAL #1   Title Pt will increase left grip strength by 20# and pinch strength by 6# to improve ability to maintain a sustained grip on items during ADLs and hunting.    Time 8    Period Weeks    Status Partially Met      OT LONG TERM GOAL #2   Title Pt will decrease pain in left hand and arm to 3/10 or less to improve ability to sleep 4 consecutive hours or more without waking due to pain.    Time 8    Period Weeks    Status Not Met      OT LONG TERM GOAL #3   Title Pt will improve fine motor coordination in left hand by completing 9 hole peg test in under 30" to improve ability to manipulate objects during ADL and work tasks.    Time 8    Period Weeks    Status Not Met                 Plan - 08/31/20 0928    Clinical Impression Statement A: Reassessment completed this session, pt has met 3/4 STGs with remaining STG partially met, and has partially met 1 LTG. Pt reports increased ability to use left hand during ADLs, continues to have difficulty with sustained grasp of objects. Pt had nerve study with normal results on 08/25/20. Discussed nerve regeneration process and timeline with pt who verbalized understanding. Educated pt on using left hand as normally as possible to continue improving hand strength and functional use. Pt is agreeable to discharge today with updated HEP.    Body Structure / Function / Physical Skills ADL;Endurance;UE functional use;Fascial restriction;Pain;Coordination;Proprioception;Sensation;IADL;Strength;Edema;Dexterity    Plan P: discharge pt    OT Home Exercise Plan 10/6: desensitization techniques, yellow theraputty grip and pinch strengthening; 11/17: red theraputty, education on nerve regeneration, encouraged to use LUE as normally as possible    Consulted and Agree with Plan of Care Patient           Patient will benefit from skilled therapeutic intervention in order to improve the following deficits  and impairments:   Body Structure / Function / Physical Skills: ADL, Endurance, UE functional use, Fascial restriction, Pain, Coordination, Proprioception, Sensation, IADL, Strength, Edema, Dexterity       Visit Diagnosis: Pain in left forearm  Pain in left hand  Other symptoms and signs involving the musculoskeletal system  Other lack of coordination  Decreased sensation of hand and arm    Problem List Patient Active Problem List   Diagnosis Date Noted  . Ischemic cardiomyopathy 06/28/2020  . Acute systolic heart failure (HCC) 06/28/2020  . 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 Coronary artery disease 06/24/2020  . NSTEMI (non-ST elevated myocardial infarction) (HCC) 06/23/2020  . Hyperlipidemia 05/02/2011  . Esophagitis 05/02/2011  . CORONARY ATHEROSCLEROSIS NATIVE CORONARY ARTERY 04/07/2010   Ezra Sites, OTR/L  [phone] 08/31/2020, 9:46 AM   [facility] [address] [place], [place], [address] Phone: [phone]   Fax:  [phone]  Name: ITZAE MIRALLES MRN: [id number] Date of Birth: Jul 25, 1981   OCCUPATIONAL THERAPY DISCHARGE SUMMARY  Visits from Start of Care: 4  Current functional level related to goals / functional outcomes: See above. Pt has improved strength  and functional use of left hand. Reports he has been practicing grasping and holding items, uses vision to compensate for sensation deficits. Pt has met 3/4 STGs with remaining STG partially met, and has partially met 1 LTG. Pt is agreeable to discharge today with updated HEP.  Remaining deficits: Decreased sensation, decreased 3 point pinch, decreased coordination   Education / Equipment: Red theraputty strengthening, nerve regeneration information Plan: Patient agrees to discharge.  Patient goals were partially met. Patient is being discharged due to being pleased with the current functional level.  ?????

## 2020-09-05 ENCOUNTER — Telehealth: Payer: Self-pay | Admitting: Cardiology

## 2020-09-05 ENCOUNTER — Other Ambulatory Visit: Payer: Self-pay

## 2020-09-05 MED ORDER — CLOPIDOGREL BISULFATE 75 MG PO TABS
75.0000 mg | ORAL_TABLET | Freq: Every day | ORAL | 3 refills | Status: DC
Start: 2020-09-05 — End: 2021-02-16

## 2020-09-05 MED ORDER — ROSUVASTATIN CALCIUM 40 MG PO TABS
40.0000 mg | ORAL_TABLET | Freq: Every day | ORAL | 3 refills | Status: DC
Start: 2020-09-05 — End: 2021-02-06

## 2020-09-05 NOTE — Telephone Encounter (Signed)
Spoke with pt, aware new prescriptions sent to the pharmacy.

## 2020-09-05 NOTE — Telephone Encounter (Signed)
New Message:     Pt wants to know if he is to continue to take Plavix and Crestor. If so, he will need refills called in.

## 2020-09-06 ENCOUNTER — Encounter (HOSPITAL_COMMUNITY): Payer: Self-pay

## 2020-09-13 ENCOUNTER — Encounter (HOSPITAL_COMMUNITY): Payer: Self-pay

## 2020-09-21 ENCOUNTER — Encounter (HOSPITAL_COMMUNITY): Payer: Self-pay | Admitting: Occupational Therapy

## 2020-09-28 ENCOUNTER — Encounter: Payer: Self-pay | Admitting: General Practice

## 2020-10-09 ENCOUNTER — Other Ambulatory Visit: Payer: Self-pay | Admitting: Orthopedic Surgery

## 2020-10-13 ENCOUNTER — Ambulatory Visit (HOSPITAL_COMMUNITY): Payer: Self-pay | Attending: Cardiology

## 2020-10-13 ENCOUNTER — Other Ambulatory Visit: Payer: Self-pay

## 2020-10-13 DIAGNOSIS — Z951 Presence of aortocoronary bypass graft: Secondary | ICD-10-CM

## 2020-10-13 DIAGNOSIS — Z720 Tobacco use: Secondary | ICD-10-CM

## 2020-10-13 DIAGNOSIS — I255 Ischemic cardiomyopathy: Secondary | ICD-10-CM

## 2020-10-13 DIAGNOSIS — I5042 Chronic combined systolic (congestive) and diastolic (congestive) heart failure: Secondary | ICD-10-CM

## 2020-10-13 DIAGNOSIS — I2510 Atherosclerotic heart disease of native coronary artery without angina pectoris: Secondary | ICD-10-CM

## 2020-10-13 DIAGNOSIS — E785 Hyperlipidemia, unspecified: Secondary | ICD-10-CM

## 2020-10-13 DIAGNOSIS — I10 Essential (primary) hypertension: Secondary | ICD-10-CM

## 2020-10-13 LAB — ECHOCARDIOGRAM COMPLETE
Area-P 1/2: 4.68 cm2
S' Lateral: 4 cm

## 2020-10-13 MED ORDER — PERFLUTREN LIPID MICROSPHERE
1.0000 mL | INTRAVENOUS | Status: AC | PRN
  Administered 2020-10-13: 3 mL via INTRAVENOUS

## 2020-10-13 NOTE — Progress Notes (Signed)
HPI: Follow-up coronary artery disease. 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 Cath revealed occluded RCA, 50 OM1, 70 proximal LAD. Pt had PCI of RCA and LAD with DES. Also with h/o traumatic SDH.   Patient had non-ST elevation myocardial infarction in September 2021.  Cardiac catheterization revealed severe three-vessel coronary artery disease and elevated left ventricular end-diastolic pressure.  Ejection fraction 25 to 30%.  Echocardiogram showed ejection fraction 35 to 40%, mild RV dysfunction, mild mitral regurgitation.  Carotid Dopplers showed no significant obstruction.  Patient underwent coronary artery bypass and graft September 2021 with LIMA to the LAD, RIMA to the PDA, saphenous vein graft to OM 2, left radial artery graft to diagonal and OM1 and saphenous vein graft to the right coronary artery acute marginal.  Follow-up echocardiogram December 2021 showed ejection fraction 20 to 25%, mild mitral regurgitation.  Since last seen he does have dyspnea on exertion.  No orthopnea, PND, pedal edema, exertional chest pain or syncope.  Current Outpatient Medications  Medication Sig Dispense Refill  . aspirin EC 81 MG EC tablet Take 1 tablet (81 mg total) by mouth daily. Swallow whole. 30 tablet 11  . clopidogrel (PLAVIX) 75 MG tablet Take 1 tablet (75 mg total) by mouth daily. 90 tablet 3  . gabapentin (NEURONTIN) 300 MG capsule TAKE 1 CAPSULE BY MOUTH THREE TIMES A DAY 90 capsule 0  . LORazepam (ATIVAN) 0.5 MG tablet Take 0.5 mg by mouth 2 (two) times daily.  2  . potassium chloride SA (KLOR-CON) 20 MEQ tablet Take 1 tablet (20 mEq total) by mouth daily as needed. When you take Furosemide 90 tablet 1  . rosuvastatin (CRESTOR) 40 MG tablet Take 1 tablet (40 mg total) by mouth daily. 90 tablet 3  . furosemide (LASIX) 40 MG tablet Take 1 tablet (40 mg total) by mouth daily as needed (for weight gain of 3 lbs overnight or 5 lbs in one week). 90 tablet 1   No current facility-administered medications for this  visit.     Past Medical History:  Diagnosis Date  . CAD (coronary artery disease)    inferiot MI; LHC with EF 55% and basal to mid inferior hypokinesis. totally occluded RCA w/weak L-R collaterals. 50% ostial OM1. long 70% proximal LAD. Pt had promus DES to LAD and RCA. echo 9/11: EF 55%, no regional wall motion abnormalities   . GI bleed 9/11   secondary to mallory-weiss tear with endoscopic clipping   . Hyperlipidemia   . Hypertension   . Nephrolithiasis   . Transfusion history 2011/2012  . 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 CARDIAC SURGERY    . CORONARY ARTERY BYPASS GRAFT N/A 06/24/2020   Procedure: CORONARY ARTERY BYPASS GRAFTING (CABG) x 6, bilateral IMA, LIMA TO LAD, RIMA TO PDA, LEFT RADIAL ARTERY TO DIAG. 1 SEQ TO OM1, SVG TO OM2, SVG TO ACUTE MARGINAL;  Surgeon: Linden Dolin, MD;  Location: [facility] OR;  Service: Open Heart Surgery;  Laterality: N/A;  . ENDOVEIN HARVEST OF GREATER SAPHENOUS VEIN Right 06/24/2020   Procedure: ENDOVEIN HARVEST OF GREATER SAPHENOUS VEIN;  Surgeon: Linden Dolin, MD;  Location: [facility] OR;  Service: Open Heart Surgery;  Laterality: Right;  . IABP INSERTION N/A 06/23/2020   Procedure: IABP Insertion;  Surgeon: Yvonne Kendall, MD;  Location: [facility] INVASIVE CV LAB;  Service: Cardiovascular;  Laterality: N/A;  . LEFT HEART CATH AND  CORONARY ANGIOGRAPHY N/A 06/23/2020   Procedure: LEFT HEART CATH AND CORONARY ANGIOGRAPHY;  Surgeon: Yvonne Kendall, MD;  Location: [facility] INVASIVE CV LAB;  Service: Cardiovascular;  Laterality: N/A;  . patients states had two stent surgery to his heart  03/30/2010  . RADIAL ARTERY HARVEST Left 06/24/2020   Procedure: RADIAL ARTERY HARVEST;  Surgeon: Linden Dolin, MD;  Location: [facility] OR;  Service: Open Heart Surgery;  Laterality: Left;  . TEE WITHOUT CARDIOVERSION N/A 06/24/2020   Procedure: TRANSESOPHAGEAL ECHOCARDIOGRAM  (TEE);  Surgeon: Linden Dolin, MD;  Location[facility] OR;  Service: Open Heart Surgery;  Laterality: N/A;    Social History   Socioeconomic History  . Marital status: Single    Spouse name: Not on file  . Number of children: 2  . Years of education: Not on file  . Highest education level: Not on file  Occupational History  . Not on file  Tobacco Use  . Smoking status: Former Smoker    Packs/day: 1.50  . Smokeless tobacco: Never Used  . Tobacco comment: quit 9/11  Substance and Sexual Activity  . Alcohol use: Yes    Comment: occasional   . Drug use: No  . Sexual activity: Not on file  Other Topics Concern  . Not on file  Social History Narrative   Married; lives in [place].   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 Rheum arthritis Father   . Coronary artery disease Other        noncontributory for early CAD and MI  . Cancer Mother   . Rheum arthritis Mother   . Diabetes Mother   . Hyperlipidemia Mother   . Hypertension Mother   . Stroke Other     ROS: no fevers or chills, productive cough, hemoptysis, dysphasia, odynophagia, melena, hematochezia, dysuria, hematuria, rash, seizure activity, orthopnea, PND, pedal edema, claudication. Remaining systems are negative.  Physical Exam: Well-developed well-nourished in no acute distress.  Skin is warm and dry.  HEENT is normal.  Neck is supple.  Chest is clear to auscultation with normal expansion.  Cardiovascular exam is regular rate and rhythm.  Abdominal exam nontender or distended. No masses palpated. Extremities show no edema. neuro grossly intact  A/P  1 coronary artery disease status  post coronary artery bypass graft-continue aspirin, Plavix and statin.  Will discontinue Plavix September 2021.   2 ischemic cardiomyopathy-plan to add losartan 25 mg daily and potentially transition to [facility] in the future if blood pressure allows.  Add spironolactone 12.5 mg daily.  Add digoxin 0.125 mg daily.  Check potassium and renal function in 1 week.  Will add carvedilol in the near future if blood pressure allows.  Refer to CHF clinic for medication titration.  Once medications fully titrated we will repeat echocardiogram and if ejection fraction less than 35% will need ICD.  3 chronic systolic congestive heart failure-he appears to be euvolemic.  Add spironolactone 12.5 mg daily as outlined above.  Continue Lasix as needed.  4 hyperlipidemia-continue statin.  Recent LDL not at goal.  Add Zetia 10 mg daily.  Check lipids and liver in 12 weeks.  5  history of tobacco abuse-patient has not smoked since discharge.  6 hypertension-blood pressure is borderline.  We are adding cardiac medications as outlined above and will continue to titrate as tolerated.  7 history of traumatic subdural hematoma  Olga Millers, MD

## 2020-10-17 ENCOUNTER — Telehealth: Payer: Self-pay | Admitting: Cardiology

## 2020-10-17 NOTE — Telephone Encounter (Signed)
Patient is requesting to further discuss echo results prior to appointment with Dr. Jens Som on 10/21/19. He states he is concerned that he may need to be seen sooner. Please call.

## 2020-10-18 NOTE — Telephone Encounter (Signed)
Called patient, advised that it looked as tho a message from PA was sent to [facility] on recommendations for his ECHO results, and I did not see a response yet. Patient just wanted to make sure he did not need to be seen sooner- appointment is on 01/06- advised I would ask about this just to make sure.  Patient was thankful for call back. Will route to nurse.

## 2020-10-19 NOTE — Telephone Encounter (Signed)
Spoke with pt, aware appointment tomorrow is fine. Will discuss echo at that time.

## 2020-10-20 ENCOUNTER — Ambulatory Visit (INDEPENDENT_AMBULATORY_CARE_PROVIDER_SITE_OTHER): Payer: Self-pay | Admitting: Cardiology

## 2020-10-20 ENCOUNTER — Other Ambulatory Visit: Payer: Self-pay

## 2020-10-20 ENCOUNTER — Encounter: Payer: Self-pay | Admitting: Cardiology

## 2020-10-20 VITALS — BP 125/93 | HR 80 | Temp 97.7°F | Ht 69.0 in | Wt 248.2 lb

## 2020-10-20 DIAGNOSIS — I5042 Chronic combined systolic (congestive) and diastolic (congestive) heart failure: Secondary | ICD-10-CM

## 2020-10-20 DIAGNOSIS — E785 Hyperlipidemia, unspecified: Secondary | ICD-10-CM

## 2020-10-20 DIAGNOSIS — I255 Ischemic cardiomyopathy: Secondary | ICD-10-CM

## 2020-10-20 DIAGNOSIS — I2510 Atherosclerotic heart disease of native coronary artery without angina pectoris: Secondary | ICD-10-CM

## 2020-10-20 DIAGNOSIS — I10 Essential (primary) hypertension: Secondary | ICD-10-CM

## 2020-10-20 MED ORDER — SPIRONOLACTONE 25 MG PO TABS: 12.5000 mg | ORAL_TABLET | Freq: Every day | ORAL | 3 refills | Status: DC

## 2020-10-20 MED ORDER — DIGOXIN 125 MCG PO TABS: 0.1250 mg | ORAL_TABLET | Freq: Every day | ORAL | 3 refills | Status: DC

## 2020-10-20 MED ORDER — LOSARTAN POTASSIUM 25 MG PO TABS: 25.0000 mg | ORAL_TABLET | Freq: Every day | ORAL | 3 refills | Status: DC

## 2020-10-20 MED ORDER — EZETIMIBE 10 MG PO TABS: 10.0000 mg | ORAL_TABLET | Freq: Every day | ORAL | 3 refills | Status: DC

## 2020-10-20 NOTE — Patient Instructions (Signed)
Medication Instructions:   START EZETIMIBE 10 MG ONCE DAILY  START SPIRONOLACTONE 12.5 MG ONCE DAILY= 1/2 OF THE 25 MG TABLET ONCE DAILY  START DIGOXIN 0.125 MG ONCE DAILY  START LOSARTAN 25 MG ONCE DAILY  *If you need a refill on your cardiac medications before your next appointment, please call your pharmacy*   Lab Work:  Your physician recommends that you return for lab work in: ONE WEEK  Your physician recommends that you return for lab work in: 212 WEEKS FASTING  If you have labs (blood work) drawn today and your tests are completely normal, you will receive your results only by: Marland Kitchen MyChart Message (if you have MyChart) OR . A paper copy in the mail If you have any lab test that is abnormal or we need to change your treatment, we will call you to review the results.   Follow-Up: At [facility], you and your health needs are our priority.  As part of our continuing mission to provide you with exceptional heart care, we have created designated Provider Care Teams.  These Care Teams include your primary Cardiologist (physician) and Advanced Practice Providers (APPs -  Physician Assistants and Nurse Practitioners) who all work together to provide you with the care you need, when you need it.  We recommend signing up for the patient portal called "MyChart".  Sign up information is provided on this After Visit Summary.  MyChart is used to connect with patients for Virtual Visits (Telemedicine).  Patients are able to view lab/test results, encounter notes, upcoming appointments, etc.  Non-urgent messages can be sent to your provider as well.   To learn more about what you can do with MyChart, go to [email or web address].    Your next appointment:   3 month(s)  The format for your next appointment:   In Person  Provider:   Olga Millers, MD

## 2020-10-28 LAB — BASIC METABOLIC PANEL
BUN/Creatinine Ratio: 11 (ref 9–20)
BUN: 9 mg/dL (ref 6–24)
CO2: 18 mmol/L — ABNORMAL LOW (ref 20–29)
Calcium: 8.8 mg/dL (ref 8.7–10.2)
Chloride: 102 mmol/L (ref 96–106)
Creatinine, Ser: 0.85 mg/dL (ref 0.76–1.27)
GFR calc Af Amer: 126 mL/min/{1.73_m2} (ref >59)
GFR calc non Af Amer: 109 mL/min/{1.73_m2} (ref >59)
Glucose: 99 mg/dL (ref 65–99)
Potassium: 3.8 mmol/L (ref 3.5–5.2)
Sodium: 139 mmol/L (ref 134–144)

## 2020-11-07 ENCOUNTER — Other Ambulatory Visit: Payer: Self-pay | Admitting: Orthopedic Surgery

## 2020-11-07 ENCOUNTER — Other Ambulatory Visit: Payer: Self-pay | Admitting: Medical

## 2020-11-08 DIAGNOSIS — Z0279 Encounter for issue of other medical certificate: Secondary | ICD-10-CM

## 2020-11-16 ENCOUNTER — Telehealth: Payer: Self-pay | Admitting: Cardiology

## 2020-11-16 NOTE — Telephone Encounter (Signed)
Patient dropped off paperwork in person for Dr.Crenshaw to complete on 1/25 from [employer], paperwork was put in providers box.

## 2020-11-21 NOTE — Telephone Encounter (Signed)
Called to inform patient that his paperwork was ready to be picked up at the [facility] front desk.

## 2020-11-23 NOTE — Telephone Encounter (Signed)
Patient completed his part of the paperwork, and had them faxed from [place] office on 2/9.

## 2020-12-09 ENCOUNTER — Other Ambulatory Visit: Payer: Self-pay | Admitting: Physician Assistant

## 2020-12-21 ENCOUNTER — Other Ambulatory Visit: Payer: Self-pay | Admitting: Orthopedic Surgery

## 2021-01-05 ENCOUNTER — Other Ambulatory Visit: Payer: Self-pay

## 2021-01-05 ENCOUNTER — Other Ambulatory Visit (HOSPITAL_COMMUNITY): Payer: Self-pay | Admitting: Cardiology

## 2021-01-05 ENCOUNTER — Encounter (HOSPITAL_COMMUNITY): Payer: Self-pay | Admitting: Cardiology

## 2021-01-05 ENCOUNTER — Ambulatory Visit (HOSPITAL_COMMUNITY)
Admission: RE | Admit: 2021-01-05 | Discharge: 2021-01-05 | Disposition: A | Discharge status: Home / Self Care | Payer: Self-pay | Source: Ambulatory Visit | Attending: Cardiology | Admitting: Cardiology

## 2021-01-05 ENCOUNTER — Telehealth (HOSPITAL_COMMUNITY): Payer: Self-pay | Admitting: Pharmacy Technician

## 2021-01-05 VITALS — BP 124/80 | HR 60 | Wt 245.2 lb

## 2021-01-05 DIAGNOSIS — Z733 Stress, not elsewhere classified: Secondary | ICD-10-CM | POA: Insufficient documentation

## 2021-01-05 DIAGNOSIS — Z7902 Long term (current) use of antithrombotics/antiplatelets: Secondary | ICD-10-CM | POA: Insufficient documentation

## 2021-01-05 DIAGNOSIS — I2510 Atherosclerotic heart disease of native coronary artery without angina pectoris: Secondary | ICD-10-CM | POA: Insufficient documentation

## 2021-01-05 DIAGNOSIS — Z955 Presence of coronary angioplasty implant and graft: Secondary | ICD-10-CM | POA: Insufficient documentation

## 2021-01-05 DIAGNOSIS — I252 Old myocardial infarction: Secondary | ICD-10-CM | POA: Insufficient documentation

## 2021-01-05 DIAGNOSIS — I5022 Chronic systolic (congestive) heart failure: Secondary | ICD-10-CM | POA: Insufficient documentation

## 2021-01-05 DIAGNOSIS — I255 Ischemic cardiomyopathy: Secondary | ICD-10-CM | POA: Insufficient documentation

## 2021-01-05 DIAGNOSIS — E785 Hyperlipidemia, unspecified: Secondary | ICD-10-CM | POA: Insufficient documentation

## 2021-01-05 DIAGNOSIS — Z87891 Personal history of nicotine dependence: Secondary | ICD-10-CM | POA: Insufficient documentation

## 2021-01-05 DIAGNOSIS — Z951 Presence of aortocoronary bypass graft: Secondary | ICD-10-CM | POA: Insufficient documentation

## 2021-01-05 DIAGNOSIS — Z597 Insufficient social insurance and welfare support: Secondary | ICD-10-CM | POA: Insufficient documentation

## 2021-01-05 DIAGNOSIS — I5042 Chronic combined systolic (congestive) and diastolic (congestive) heart failure: Secondary | ICD-10-CM

## 2021-01-05 DIAGNOSIS — Z8719 Personal history of other diseases of the digestive system: Secondary | ICD-10-CM | POA: Insufficient documentation

## 2021-01-05 DIAGNOSIS — I110 Hypertensive heart disease with heart failure: Secondary | ICD-10-CM | POA: Insufficient documentation

## 2021-01-05 DIAGNOSIS — Z79899 Other long term (current) drug therapy: Secondary | ICD-10-CM | POA: Insufficient documentation

## 2021-01-05 DIAGNOSIS — Z7982 Long term (current) use of aspirin: Secondary | ICD-10-CM | POA: Insufficient documentation

## 2021-01-05 HISTORY — DX: Heart failure, unspecified: I50.9

## 2021-01-05 LAB — BASIC METABOLIC PANEL
Anion gap: 9 (ref 5–15)
BUN: 6 mg/dL (ref 6–20)
CO2: 25 mmol/L (ref 22–32)
Calcium: 8.8 mg/dL — ABNORMAL LOW (ref 8.9–10.3)
Chloride: 102 mmol/L (ref 98–111)
Creatinine, Ser: 0.84 mg/dL (ref 0.61–1.24)
GFR, Estimated: >60 mL/min (ref >60)
Glucose, Bld: 102 mg/dL — ABNORMAL HIGH (ref 70–99)
Potassium: 3.4 mmol/L — ABNORMAL LOW (ref 3.5–5.1)
Sodium: 136 mmol/L (ref 135–145)

## 2021-01-05 LAB — LIPID PANEL
Cholesterol: 109 mg/dL (ref 0–200)
HDL: 47 mg/dL (ref >40)
LDL Cholesterol: 41 mg/dL (ref 0–99)
Total CHOL/HDL Ratio: 2.3 RATIO
Triglycerides: 107 mg/dL (ref <150)
VLDL: 21 mg/dL (ref 0–40)

## 2021-01-05 LAB — CBC
HCT: 41.7 % (ref 39.0–52.0)
Hemoglobin: 14.1 g/dL (ref 13.0–17.0)
MCH: 29.8 pg (ref 26.0–34.0)
MCHC: 33.8 g/dL (ref 30.0–36.0)
MCV: 88.2 fL (ref 80.0–100.0)
Platelets: 350 10*3/uL (ref 150–400)
RBC: 4.73 MIL/uL (ref 4.22–5.81)
RDW: 14 % (ref 11.5–15.5)
WBC: 8.2 10*3/uL (ref 4.0–10.5)
nRBC: 0 % (ref 0.0–0.2)

## 2021-01-05 LAB — DIGOXIN LEVEL: Digoxin Level: 0.6 ng/mL — ABNORMAL LOW (ref 0.8–2.0)

## 2021-01-05 MED ORDER — FUROSEMIDE 40 MG PO TABS
20.0000 mg | ORAL_TABLET | Freq: Every day | ORAL | 1 refills | Status: DC
Start: 2021-01-05 — End: 2021-02-06

## 2021-01-05 MED ORDER — CARVEDILOL 3.125 MG PO TABS: 3.1250 mg | ORAL_TABLET | Freq: Two times a day (BID) | ORAL | 3 refills | Status: DC

## 2021-01-05 MED ORDER — POTASSIUM CHLORIDE CRYS ER 10 MEQ PO TBCR: 10.0000 meq | EXTENDED_RELEASE_TABLET | Freq: Every day | ORAL | 6 refills | Status: DC

## 2021-01-05 MED ORDER — ENTRESTO 24-26 MG PO TABS: 1.0000 | ORAL_TABLET | Freq: Two times a day (BID) | ORAL | 3 refills | Status: DC

## 2021-01-05 MED FILL — FUROSEMIDE 20 MG TABS: 20 | 30 days supply | Qty: 30 | Fill #0

## 2021-01-05 MED FILL — POTASSIUM CHLORIDE CRYS ER: 10 | 34 days supply | Qty: 34 | Fill #0

## 2021-01-05 MED FILL — DIGOXIN 0.125 MG TABLET: 125 | 34 days supply | Qty: 34 | Fill #0

## 2021-01-05 MED FILL — ENTRESTO 24 MG-26 MG TABLET: 24-26 | 30 days supply | Qty: 60 | Fill #0

## 2021-01-05 MED FILL — CARVEDILOL 3.125 MG TABLET: 3.125 | 34 days supply | Qty: 68 | Fill #0

## 2021-01-05 MED FILL — SPIRONOLACTONE 25 MG TABLET: 25 | 34 days supply | Qty: 17 | Fill #0

## 2021-01-05 NOTE — Progress Notes (Signed)
Heart and Vascular Care Navigation  01/05/2021  [name] January 03, 1981 [id number]  Reason for Referral: Consulted to speak with pt regarding lack of insurance.   Engaged with patient face to face for initial visit for Heart and Vascular Care Coordination.                                                                                                   Assessment:  CSW met with pt and pt sister during clinic visit.  Pt reports he has been unemployed and without insurance since his hospital stay in Sept 2021.  Pt has been living off of savings since that time and was supposed to start another job before having another medical set back and being kept out of work by MDs.    Pt reports he applied for disability Dec. 2021 but hasn't heard anything- CSW encouraged pt to call local [place] office and DDS to check on status of his case and make sure he doesn't need to submit additional documentation from more recent medical visits- CSW offered to assist with sending documentation if needed.  Pt also applied for Medicaid but states he was denied because he is over reserve limit- owns several cars he is working on which add up to more than $2,000 reserve limit.  Pt unable to afford ACA insurance without income.  Per financial counseling notes pt sent CAFA multiple times but states he never got one- CSW provided him hard copy during visit and encouraged him to fill out and mail back in or bring to clinic for CSW to review and submit on his behalf.  Pt admits to financial hardship as he has run through his savings and has been relying on family to help with medication and food/housing costs.  Clinic is setting him up to get medications through heart failure fund so hopefully this will assist with concerns.  Pt has not applied for food stamps so CSW encouraged him to use online account which he established to apply for Medicaid to submit food stamp application to assist with costs.  Also informed pt of crisis  assistance programs through [facility] which he could apply for if he needs help in the future with rent or utility bills.                                    HRT/VAS Care Coordination    Outpatient Care Team Social Worker   Social Worker Name: Tammy Sours- [facility]- [phone]   Living arrangements for the past 2 months Single Family Home   Lives with: Self   Patient Current Insurance Coverage Self-Pay   Patient Has Concern With Paying Medical Bills Yes   Patient Concerns With Medical Bills no insurance   Medical Bill Referrals: CAFA   Does Patient Have Prescription Coverage? No   Patient Prescription Assistance Programs Heart Failure Fund; Patient Assistance Programs   Patient Assistance Programs Medications [employer] app pending   Home Assistive Devices/Equipment None      Social History:  SDOH Screenings   Alcohol Screen: Not on file  Depression (ZXY7-2): Not on file  Financial Resource Strain: High Risk  . Difficulty of Paying Living Expenses: Hard  Food Insecurity: No Food Insecurity  . Worried About Charity fundraiser in the Last Year: Never true  . Ran Out of Food in the Last Year: Never true  Housing: Medium Risk  . Last Housing Risk Score: 1  Physical Activity: Not on file  Social Connections: Not on file  Stress: Not on file  Tobacco Use: Medium Risk  . Smoking Tobacco Use: Former Smoker  . Smokeless Tobacco Use: Never Used  Transportation Needs: No Transportation Needs  . Lack of Transportation (Medical): No  . Lack of Transportation (Non-Medical): No    SDOH Interventions: Financial Resources:  Financial Strain Interventions: Other (Comment) (referred to [facility] for crisis assistance)   Food Insecurity:  Food Insecurity Interventions: Assist with [employer]  Housing Insecurity:  Housing Interventions: Other (Comment) (referred to [facility] for crisis assistance program)   Transportation:    no concerns- has his own car and his sister drives as well    Other Care Navigation Interventions:     Provided Pharmacy assistance resources Heart Failure Fund,Patient Assistance Programs- application completed for [employer] to help with Entresto   Follow-up plan:    Pt to apply for food stamps and complete CAFA application  Provided pt with CSW number and encouraged to call if he had any utility or rental costs that they could not cover and he needed assistance.  Will continue to follow and assist as needed  [name], [facility] Desk#: [phone] Cell#: [phone]

## 2021-01-05 NOTE — Patient Instructions (Addendum)
Stop Losartan  Start Entresto 24/26 mg Twice daily   Start Carvedilol 3.125 mg Twice daily   Decrease Furosemide to 20 mg Daily  Decrease Potassium to 10 meq Daily  Labs done today, your results will be available in MyChart, we will contact you for abnormal readings.  Your physician recommends that you return for lab work in: 1 week  Please follow up with our heart failure pharmacist in 2-3 weeks  Your physician recommends that you schedule a follow-up appointment in: 6-8 weeks  Do the following things EVERYDAY: 1) Weigh yourself in the morning before breakfast. Write it down and keep it in a log. 2) Take your medicines as prescribed 3) Eat low salt foods--Limit salt (sodium) to 2000 mg per day.  4) Stay as active as you can everyday 5) Limit all fluids for the day to less than 2 liters  If you have any questions or concerns before your next appointment please send [place] a message through [place] or call our office at [phone].    TO LEAVE A MESSAGE FOR THE NURSE SELECT OPTION 2, PLEASE LEAVE A MESSAGE INCLUDING: . YOUR NAME . DATE OF BIRTH . CALL BACK NUMBER . REASON FOR CALL**this is important as we prioritize the call backs  YOU WILL RECEIVE A CALL BACK THE SAME DAY AS LONG AS YOU CALL BEFORE 4:00 PM  At the [facility], you and your health needs are our priority. As part of our continuing mission to provide you with exceptional heart care, we have created designated Provider Care Teams. These Care Teams include your primary Cardiologist (physician) and Advanced Practice Providers (APPs- Physician Assistants and Nurse Practitioners) who all work together to provide you with the care you need, when you need it.   You may see any of the following providers on your designated Care Team at your next follow up: Marland Kitchen Dr Arvilla Meres . Dr Marca Ancona . Dr Thornell Mule . Tonye Becket, NP . Robbie Lis, PA . Shanda Bumps [place],NP . Karle Plumber,  PharmD   Please be sure to bring in all your medications bottles to every appointment.

## 2021-01-05 NOTE — Telephone Encounter (Signed)
Patient was seen in clinic today and started on Entresto. 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   Will fax in once signatures obtained.

## 2021-01-05 NOTE — Progress Notes (Signed)
HF meds all sent to [facility] Outpatient pharmacy under HF Fund, 30 day free card for [name] provided and pt signed [employer] pt Adult nurse
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
## 2021-01-09 ENCOUNTER — Telehealth (HOSPITAL_COMMUNITY): Payer: Self-pay | Admitting: Licensed Clinical Social Worker

## 2021-01-09 NOTE — Telephone Encounter (Signed)
Pt reached out to CSW and informed that he learned his disability case was just sent to the [facility] DDS office- he is awaiting case worker assignment and will let CSW when he knows who he was assigned to so I can reach out and see if I can expedite getting them medical records.  CSW also discussed referring pt to Care Connect[facility] program in [place] to get assistance getting PCP with no insurance and being connected to medication assistance.  Pt is agreeable to CSW placing a referral- CSW sent message to RN with that program to assist.  Will continue to follow and assist as needed  Burna Sis, LCSW Clinical Social Worker [facility] Desk#: [phone] Cell#: [phone]

## 2021-01-10 ENCOUNTER — Telehealth: Payer: Self-pay

## 2021-01-10 NOTE — Telephone Encounter (Signed)
Referral received from Rosetta Posner LCSW with [facility] to Care Connect. Mr Aversa is uninsured and in need of a primary care provider as well as other resources.  Called Mr. Blumberg and discussed the Care Connect program and services we would provide including connecting to a primary care provider in network. Applying for [facility] MedAssist program, assistance with [facility] Financial assistance application, connection to other resources such as assistance with transportation to provider medical appointments. We discussed documents needed for enrollment into care connect and [place] medAssist. Appointment scheduled for in office enrollment for 01/16/21 at 10 am. His sister will accompany him to assist. Client reports he was denied Medicaid and is in process applying for disability with assistance of [facility] LCSWs.   Francee Nodal RN [employer].

## 2021-01-11 ENCOUNTER — Telehealth (HOSPITAL_COMMUNITY): Payer: Self-pay | Admitting: Pharmacy Technician

## 2021-01-11 NOTE — Telephone Encounter (Signed)
Sent in application via fax.  Will follow up.  

## 2021-01-12 ENCOUNTER — Telehealth (HOSPITAL_COMMUNITY): Payer: Self-pay | Admitting: Licensed Clinical Social Worker

## 2021-01-12 NOTE — Telephone Encounter (Signed)
Pt called CSW and provided with case number and case worker name for disability- CSW will help follow up with disability case as needed.  CSW inquired how CAFA application was going- states he is working on gathering all required documents.  Pt then asked if he could get lab scheduled at [facility].  Was referred to [facility] closer to him but they would make him pay upfront since he doesn't have insurance so he would rather come to [facility] to get it.  CSW spoke with clinic staff who helped to schedule lab for pt next week- pt updated.  Will continue to follow and assist as needed  Burna Sis, LCSW Clinical Social Worker [facility] Desk#: [phone] Cell#: [phone]

## 2021-01-13 ENCOUNTER — Telehealth (HOSPITAL_COMMUNITY): Payer: Self-pay | Admitting: Pharmacy Technician

## 2021-01-13 ENCOUNTER — Other Ambulatory Visit: Payer: Self-pay | Admitting: Medical
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
## 2021-01-13 NOTE — Telephone Encounter (Signed)
Advanced Heart Failure Patient Advocate Encounter   Patient was approved to receive Entresto from [employer]  Patient ID: [id number] Effective dates: 01/12/21 through 01/12/22  Called and spoke with the patient.   Archer Asa, CPhT

## 2021-01-17 ENCOUNTER — Ambulatory Visit (HOSPITAL_COMMUNITY)
Admission: RE | Admit: 2021-01-17 | Discharge: 2021-01-17 | Disposition: A | Discharge status: Home / Self Care | Payer: Medicaid Other | Source: Ambulatory Visit | Attending: Cardiology | Admitting: Cardiology

## 2021-01-17 ENCOUNTER — Other Ambulatory Visit: Payer: Self-pay

## 2021-01-17 DIAGNOSIS — E785 Hyperlipidemia, unspecified: Secondary | ICD-10-CM

## 2021-01-17 DIAGNOSIS — I5022 Chronic systolic (congestive) heart failure: Secondary | ICD-10-CM

## 2021-01-17 LAB — HEPATIC FUNCTION PANEL
ALT: 58 U/L — ABNORMAL HIGH (ref 0–44)
AST: 31 U/L (ref 15–41)
Albumin: 3.8 g/dL (ref 3.5–5.0)
Alkaline Phosphatase: 69 U/L (ref 38–126)
Bilirubin, Direct: 0.2 mg/dL (ref 0.0–0.2)
Indirect Bilirubin: 1.1 mg/dL — ABNORMAL HIGH (ref 0.3–0.9)
Total Bilirubin: 1.3 mg/dL — ABNORMAL HIGH (ref 0.3–1.2)
Total Protein: 7.7 g/dL (ref 6.5–8.1)

## 2021-01-17 LAB — BASIC METABOLIC PANEL
Anion gap: 10 (ref 5–15)
BUN: 11 mg/dL (ref 6–20)
CO2: 23 mmol/L (ref 22–32)
Calcium: 9.1 mg/dL (ref 8.9–10.3)
Chloride: 100 mmol/L (ref 98–111)
Creatinine, Ser: 0.97 mg/dL (ref 0.61–1.24)
GFR, Estimated: >60 mL/min (ref >60)
Glucose, Bld: 105 mg/dL — ABNORMAL HIGH (ref 70–99)
Potassium: 4 mmol/L (ref 3.5–5.1)
Sodium: 133 mmol/L — ABNORMAL LOW (ref 135–145)

## 2021-01-17 LAB — LIPID PANEL
Cholesterol: 119 mg/dL (ref 0–200)
HDL: 48 mg/dL (ref >40)
LDL Cholesterol: 51 mg/dL (ref 0–99)
Total CHOL/HDL Ratio: 2.5 RATIO
Triglycerides: 100 mg/dL (ref <150)
VLDL: 20 mg/dL (ref 0–40)

## 2021-01-17 NOTE — Addendum Note (Signed)
Encounter addended by: Noralee Space, RN on: 01/17/2021 2:44 PM  Actions taken: Visit diagnoses modified, Order list changed, Diagnosis association updated

## 2021-01-18 ENCOUNTER — Telehealth: Payer: Self-pay | Admitting: Cardiology

## 2021-01-18 NOTE — Telephone Encounter (Signed)
Paperwork received via fax from Museum/gallery curator from [place], paperwork was put in providers box on 4/6.

## 2021-01-20 ENCOUNTER — Encounter: Payer: Self-pay | Admitting: Cardiology

## 2021-01-20 ENCOUNTER — Ambulatory Visit (INDEPENDENT_AMBULATORY_CARE_PROVIDER_SITE_OTHER): Payer: Self-pay | Admitting: Cardiology

## 2021-01-20 ENCOUNTER — Other Ambulatory Visit: Payer: Self-pay

## 2021-01-20 VITALS — BP 114/80 | HR 72 | Ht 69.0 in | Wt 244.0 lb

## 2021-01-20 DIAGNOSIS — I10 Essential (primary) hypertension: Secondary | ICD-10-CM

## 2021-01-20 DIAGNOSIS — I2510 Atherosclerotic heart disease of native coronary artery without angina pectoris: Secondary | ICD-10-CM

## 2021-01-20 DIAGNOSIS — E785 Hyperlipidemia, unspecified: Secondary | ICD-10-CM

## 2021-01-20 DIAGNOSIS — I255 Ischemic cardiomyopathy: Secondary | ICD-10-CM

## 2021-01-20 DIAGNOSIS — I5022 Chronic systolic (congestive) heart failure: Secondary | ICD-10-CM

## 2021-01-20 MED ORDER — CARVEDILOL 6.25 MG PO TABS: 6.2500 mg | ORAL_TABLET | Freq: Two times a day (BID) | ORAL | 3 refills | Status: DC

## 2021-01-20 NOTE — Patient Instructions (Signed)
Medication Instructions:   INCREASE CARVEDILOL TO 6.25 MG TWICE DAILY= 2 OF THE 3.125 MG TABLETS TWICE DAILY  *If you need a refill on your cardiac medications before your next appointment, please call your pharmacy*   Testing/Procedures:  Your physician has requested that you have an echocardiogram. Echocardiography is a painless test that uses sound waves to create images of your heart. It provides your doctor with information about the size and shape of your heart and how well your heart's chambers and valves are working. This procedure takes approximately one hour. There are no restrictions for this procedure.[address]     Follow-Up: At [facility], you and your health needs are our priority.  As part of our continuing mission to provide you with exceptional heart care, we have created designated Provider Care Teams.  These Care Teams include your primary Cardiologist (physician) and Advanced Practice Providers (APPs -  Physician Assistants and Nurse Practitioners) who all work together to provide you with the care you need, when you need it.  We recommend signing up for the patient portal called "MyChart".  Sign up information is provided on this After Visit Summary.  MyChart is used to connect with patients for Virtual Visits (Telemedicine).  Patients are able to view lab/test results, encounter notes, upcoming appointments, etc.  Non-urgent messages can be sent to your provider as well.   To learn more about what you can do with MyChart, go to [email or web address].    Your next appointment:   6 month(s)  The format for your next appointment:   In Person  Provider:   Olga Millers, MD

## 2021-01-23 NOTE — Telephone Encounter (Signed)
Paperwork received back from provider, and faxed to [facility] on 4/8. Patient picked up copy of paperwork when in office for appointment  on 4/8.

## 2021-01-24 NOTE — Congregational Nurse Program (Signed)
Updated Care Coordination note to reflect [place] MedAssist eligibility until 01/13/22 and Care Connect enrollment until 01/16/22.  Staff message sent to Rosetta Posner LCSW for [facility] to update her on above.   Plan: follow up with client after first appointment at [facility] on 01/25/21.    Elease Hashimoto. Jarold Motto RN [employer]

## 2021-01-25 ENCOUNTER — Ambulatory Visit: Payer: Self-pay | Admitting: Physician Assistant

## 2021-01-26 ENCOUNTER — Telehealth: Payer: Self-pay

## 2021-01-26 NOTE — Telephone Encounter (Signed)
Attempted call to follow up with client. Appears appointment at [facility] to establish care was missed. No future appointment scheduled. Called to assist client in rescheduling and to assess for any potential barriers to attending appointment in the future.  No Answer, left voicemail requesting return call.   Francee Nodal RN [employer]

## 2021-01-31 ENCOUNTER — Telehealth: Payer: Self-pay | Admitting: Orthopedic Surgery

## 2021-01-31 ENCOUNTER — Other Ambulatory Visit (HOSPITAL_COMMUNITY): Payer: Self-pay

## 2021-01-31 ENCOUNTER — Other Ambulatory Visit: Payer: Self-pay | Admitting: Physician Assistant

## 2021-01-31 ENCOUNTER — Telehealth: Payer: Self-pay

## 2021-01-31 ENCOUNTER — Other Ambulatory Visit: Payer: Self-pay | Admitting: Cardiology

## 2021-01-31 DIAGNOSIS — Z736 Limitation of activities due to disability: Secondary | ICD-10-CM

## 2021-01-31 MED ORDER — CARVEDILOL 6.25 MG PO TABS
6.2500 mg | ORAL_TABLET | Freq: Two times a day (BID) | ORAL | 2 refills | Status: DC
  Filled 2021-01-31: qty 68, 34d supply, fill #0

## 2021-01-31 MED ORDER — SPIRONOLACTONE 25 MG PO TABS
12.5000 mg | ORAL_TABLET | Freq: Every day | ORAL | 1 refills | Status: DC
  Filled 2021-01-31: qty 17, 34d supply, fill #0

## 2021-01-31 MED FILL — Carvedilol Tab 3.125 MG: ORAL | 34 days supply | Qty: 68 | Fill #0 | Status: CN

## 2021-01-31 MED FILL — Furosemide Tab 20 MG: ORAL | 30 days supply | Qty: 30 | Fill #0 | Status: AC

## 2021-01-31 MED FILL — Digoxin Tab 125 MCG (0.125 MG): ORAL | 34 days supply | Qty: 34 | Fill #0 | Status: AC

## 2021-01-31 NOTE — Telephone Encounter (Signed)
Can you advise in Dr Dallas Schimke' absence please?

## 2021-01-31 NOTE — Telephone Encounter (Signed)
Called Care connect client to assist in rescheduling his primary care appointment to establish primary care with [facility].  Appointment scheduled for 02/16/21 at 10:00 am.  Will text client this information to serve as his reminder.  Plan: follow up with client after his first appointment. No further needs at this time.   Francee Nodal RN [employer]

## 2021-02-01 ENCOUNTER — Other Ambulatory Visit (HOSPITAL_COMMUNITY): Payer: Self-pay

## 2021-02-01 DIAGNOSIS — I255 Ischemic cardiomyopathy: Secondary | ICD-10-CM

## 2021-02-01 MED ORDER — CARVEDILOL 6.25 MG PO TABS
6.2500 mg | ORAL_TABLET | Freq: Two times a day (BID) | ORAL | 3 refills | Status: DC
  Filled 2021-02-01: qty 60, 30d supply, fill #0

## 2021-02-02 ENCOUNTER — Encounter: Payer: Self-pay | Admitting: Physician Assistant
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
## 2021-02-06 ENCOUNTER — Ambulatory Visit (HOSPITAL_COMMUNITY)
Admission: RE | Admit: 2021-02-06 | Discharge: 2021-02-06 | Disposition: A | Discharge status: Home / Self Care | Payer: Medicaid Other | Source: Ambulatory Visit | Attending: Cardiology | Admitting: Cardiology

## 2021-02-06 ENCOUNTER — Other Ambulatory Visit: Payer: Self-pay

## 2021-02-06 ENCOUNTER — Other Ambulatory Visit (HOSPITAL_COMMUNITY): Payer: Self-pay

## 2021-02-06 VITALS — BP 88/64 | HR 77 | Wt 254.6 lb

## 2021-02-06 DIAGNOSIS — Z7901 Long term (current) use of anticoagulants: Secondary | ICD-10-CM | POA: Insufficient documentation

## 2021-02-06 DIAGNOSIS — R0602 Shortness of breath: Secondary | ICD-10-CM | POA: Insufficient documentation

## 2021-02-06 DIAGNOSIS — Z79899 Other long term (current) drug therapy: Secondary | ICD-10-CM | POA: Insufficient documentation

## 2021-02-06 DIAGNOSIS — Z951 Presence of aortocoronary bypass graft: Secondary | ICD-10-CM | POA: Insufficient documentation

## 2021-02-06 DIAGNOSIS — Z87891 Personal history of nicotine dependence: Secondary | ICD-10-CM | POA: Insufficient documentation

## 2021-02-06 DIAGNOSIS — I5022 Chronic systolic (congestive) heart failure: Secondary | ICD-10-CM

## 2021-02-06 DIAGNOSIS — I252 Old myocardial infarction: Secondary | ICD-10-CM | POA: Insufficient documentation

## 2021-02-06 DIAGNOSIS — I2510 Atherosclerotic heart disease of native coronary artery without angina pectoris: Secondary | ICD-10-CM | POA: Insufficient documentation

## 2021-02-06 MED ORDER — ROSUVASTATIN CALCIUM 40 MG PO TABS
40.0000 mg | ORAL_TABLET | Freq: Every day | ORAL | 3 refills | Status: DC
  Filled 2021-02-06: qty 30, 30d supply, fill #0
  Filled 2021-02-28: qty 30, 30d supply, fill #1
  Filled 2021-04-12: qty 30, 30d supply, fill #2
  Filled 2021-05-16 – 2021-05-31 (×2): qty 30, 30d supply, fill #3
  Filled 2021-06-30: qty 30, 30d supply, fill #4
  Filled 2021-08-02: qty 30, 30d supply, fill #5
  Filled 2021-09-11: qty 30, 30d supply, fill #6
  Filled 2021-10-10: qty 30, 30d supply, fill #7
  Filled 2021-11-21: qty 30, 30d supply, fill #8

## 2021-02-06 NOTE — Patient Instructions (Signed)
It was a pleasure seeing you today!  MEDICATIONS: -No medication changes today -Call if you have questions about your medications.  NEXT APPOINTMENT: Return to clinic in 1 month with Dr. McLean.  In general, to take care of your heart failure: -Limit your fluid intake to 2 Liters (half-gallon) per day.   -Limit your salt intake to ideally 2-3 grams (2000-3000 mg) per day. -Weigh yourself daily and record, and bring that "weight diary" to your next appointment.  (Weight gain of 2-3 pounds in 1 day typically means fluid weight.) -The medications for your heart are to help your heart and help you live longer.   -Please contact us before stopping any of your heart medications.  Call the clinic at [phone] with questions or to reschedule future appointments.  
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
## 2021-02-16 ENCOUNTER — Encounter: Payer: Self-pay | Admitting: Physician Assistant

## 2021-02-16 ENCOUNTER — Other Ambulatory Visit: Payer: Self-pay

## 2021-02-16 ENCOUNTER — Ambulatory Visit: Payer: Medicaid Other | Admitting: Physician Assistant

## 2021-02-16 ENCOUNTER — Telehealth: Payer: Self-pay

## 2021-02-16 VITALS — BP 130/89 | HR 61 | Temp 97.8°F

## 2021-02-16 DIAGNOSIS — F419 Anxiety disorder, unspecified: Secondary | ICD-10-CM

## 2021-02-16 DIAGNOSIS — I255 Ischemic cardiomyopathy: Secondary | ICD-10-CM

## 2021-02-16 DIAGNOSIS — I2510 Atherosclerotic heart disease of native coronary artery without angina pectoris: Secondary | ICD-10-CM

## 2021-02-16 DIAGNOSIS — E785 Hyperlipidemia, unspecified: Secondary | ICD-10-CM

## 2021-02-16 DIAGNOSIS — G4700 Insomnia, unspecified: Secondary | ICD-10-CM

## 2021-02-16 DIAGNOSIS — Z7689 Persons encountering health services in other specified circumstances: Secondary | ICD-10-CM

## 2021-02-16 DIAGNOSIS — Z951 Presence of aortocoronary bypass graft: Secondary | ICD-10-CM

## 2021-02-16 MED ORDER — EZETIMIBE 10 MG PO TABS: 10.0000 mg | ORAL_TABLET | Freq: Every day | ORAL | 0 refills | Status: DC

## 2021-02-16 MED ORDER — FUROSEMIDE 20 MG PO TABS: ORAL_TABLET | Freq: Every day | ORAL | 0 refills | Status: DC

## 2021-02-16 MED ORDER — CARVEDILOL 6.25 MG PO TABS: 6.2500 mg | ORAL_TABLET | Freq: Two times a day (BID) | ORAL | 0 refills | Status: DC

## 2021-02-16 MED ORDER — POTASSIUM CHLORIDE CRYS ER 20 MEQ PO TBCR: EXTENDED_RELEASE_TABLET | ORAL | 0 refills | Status: DC

## 2021-02-16 MED ORDER — ASPIRIN 81 MG PO TBEC: 81.0000 mg | DELAYED_RELEASE_TABLET | Freq: Every day | ORAL | 0 refills | Status: DC

## 2021-02-16 MED ORDER — SPIRONOLACTONE 25 MG PO TABS: 12.5000 mg | ORAL_TABLET | Freq: Every day | ORAL | 0 refills | Status: DC

## 2021-02-16 MED ORDER — CLOPIDOGREL BISULFATE 75 MG PO TABS: 75.0000 mg | ORAL_TABLET | Freq: Every day | ORAL | 0 refills | Status: DC

## 2021-02-16 MED ORDER — TRAZODONE HCL 50 MG PO TABS: 25.0000 mg | ORAL_TABLET | Freq: Every evening | ORAL | 0 refills | Status: DC | PRN

## 2021-02-16 MED ORDER — FLUOXETINE HCL 10 MG PO CAPS: 10.0000 mg | ORAL_CAPSULE | Freq: Every day | ORAL | 0 refills | Status: DC

## 2021-02-16 NOTE — Patient Instructions (Addendum)
[email or web address]  [email or web address]

## 2021-02-16 NOTE — Telephone Encounter (Signed)
Attempted to call client for follow up after his first Free clinic appointment , no answer. Left voicemail requesting return call.   Francee Nodal RN [employer]
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
## 2021-02-28 MED FILL — Digoxin Tab 125 MCG (0.125 MG): ORAL | 34 days supply | Qty: 34 | Fill #1 | Status: AC

## 2021-03-01 ENCOUNTER — Other Ambulatory Visit (HOSPITAL_COMMUNITY): Payer: Self-pay

## 2021-03-03 ENCOUNTER — Other Ambulatory Visit (HOSPITAL_COMMUNITY): Payer: Self-pay

## 2021-03-03 ENCOUNTER — Encounter (HOSPITAL_COMMUNITY): Payer: Self-pay | Admitting: Cardiology

## 2021-03-03 ENCOUNTER — Telehealth (HOSPITAL_COMMUNITY): Payer: Self-pay | Admitting: *Deleted

## 2021-03-03 ENCOUNTER — Other Ambulatory Visit: Payer: Self-pay | Admitting: Orthopaedic Surgery

## 2021-03-03 ENCOUNTER — Ambulatory Visit (HOSPITAL_COMMUNITY)
Admission: RE | Admit: 2021-03-03 | Discharge: 2021-03-03 | Disposition: A | Discharge status: Home / Self Care | Payer: Self-pay | Source: Ambulatory Visit | Attending: Cardiology | Admitting: Cardiology

## 2021-03-03 ENCOUNTER — Other Ambulatory Visit: Payer: Self-pay

## 2021-03-03 ENCOUNTER — Ambulatory Visit (HOSPITAL_COMMUNITY): Payer: Medicaid Other | Attending: Internal Medicine

## 2021-03-03 ENCOUNTER — Telehealth: Payer: Self-pay | Admitting: Cardiology

## 2021-03-03 VITALS — BP 122/70 | HR 66 | Wt 254.8 lb

## 2021-03-03 DIAGNOSIS — Z87891 Personal history of nicotine dependence: Secondary | ICD-10-CM | POA: Insufficient documentation

## 2021-03-03 DIAGNOSIS — Z7902 Long term (current) use of antithrombotics/antiplatelets: Secondary | ICD-10-CM | POA: Insufficient documentation

## 2021-03-03 DIAGNOSIS — Z7901 Long term (current) use of anticoagulants: Secondary | ICD-10-CM | POA: Insufficient documentation

## 2021-03-03 DIAGNOSIS — R0600 Dyspnea, unspecified: Secondary | ICD-10-CM | POA: Insufficient documentation

## 2021-03-03 DIAGNOSIS — Z7982 Long term (current) use of aspirin: Secondary | ICD-10-CM | POA: Insufficient documentation

## 2021-03-03 DIAGNOSIS — I5022 Chronic systolic (congestive) heart failure: Secondary | ICD-10-CM | POA: Insufficient documentation

## 2021-03-03 DIAGNOSIS — I252 Old myocardial infarction: Secondary | ICD-10-CM | POA: Insufficient documentation

## 2021-03-03 DIAGNOSIS — R0602 Shortness of breath: Secondary | ICD-10-CM | POA: Insufficient documentation

## 2021-03-03 DIAGNOSIS — Z7984 Long term (current) use of oral hypoglycemic drugs: Secondary | ICD-10-CM | POA: Insufficient documentation

## 2021-03-03 DIAGNOSIS — I255 Ischemic cardiomyopathy: Secondary | ICD-10-CM | POA: Insufficient documentation

## 2021-03-03 DIAGNOSIS — I2510 Atherosclerotic heart disease of native coronary artery without angina pectoris: Secondary | ICD-10-CM | POA: Insufficient documentation

## 2021-03-03 DIAGNOSIS — Z79899 Other long term (current) drug therapy: Secondary | ICD-10-CM | POA: Insufficient documentation

## 2021-03-03 DIAGNOSIS — Z951 Presence of aortocoronary bypass graft: Secondary | ICD-10-CM | POA: Insufficient documentation

## 2021-03-03 DIAGNOSIS — I110 Hypertensive heart disease with heart failure: Secondary | ICD-10-CM | POA: Insufficient documentation

## 2021-03-03 LAB — BASIC METABOLIC PANEL
Anion gap: 9 (ref 5–15)
BUN: 6 mg/dL (ref 6–20)
CO2: 24 mmol/L (ref 22–32)
Calcium: 8.6 mg/dL — ABNORMAL LOW (ref 8.9–10.3)
Chloride: 103 mmol/L (ref 98–111)
Creatinine, Ser: 0.83 mg/dL (ref 0.61–1.24)
GFR, Estimated: >60 mL/min (ref >60)
Glucose, Bld: 104 mg/dL — ABNORMAL HIGH (ref 70–99)
Potassium: 3 mmol/L — ABNORMAL LOW (ref 3.5–5.1)
Sodium: 136 mmol/L (ref 135–145)

## 2021-03-03 LAB — BRAIN NATRIURETIC PEPTIDE: B Natriuretic Peptide: 205.4 pg/mL — ABNORMAL HIGH (ref 0.0–100.0)

## 2021-03-03 LAB — DIGOXIN LEVEL: Digoxin Level: 0.3 ng/mL — ABNORMAL LOW (ref 0.8–2.0)

## 2021-03-03 MED ORDER — SPIRONOLACTONE 25 MG PO TABS
25.0000 mg | ORAL_TABLET | Freq: Every day | ORAL | 2 refills | Status: DC
  Filled 2021-03-03: qty 30, 30d supply, fill #0
  Filled 2021-05-16 – 2021-05-31 (×2): qty 30, 30d supply, fill #1
  Filled 2021-06-30: qty 30, 30d supply, fill #2

## 2021-03-03 MED ORDER — DAPAGLIFLOZIN PROPANEDIOL 10 MG PO TABS
10.0000 mg | ORAL_TABLET | Freq: Every day | ORAL | 6 refills | Status: DC
  Filled 2021-03-03: qty 30, 30d supply, fill #0
  Filled 2021-04-04 – 2022-02-07 (×2): qty 30, 30d supply, fill #1

## 2021-03-03 MED ORDER — FUROSEMIDE 40 MG PO TABS
40.0000 mg | ORAL_TABLET | Freq: Every day | ORAL | 2 refills | Status: DC
  Filled 2021-03-03: qty 30, 30d supply, fill #0

## 2021-03-03 NOTE — Patient Instructions (Addendum)
Start Farxiga 10 mg Daily  Increase Spironolactone to 25 mg (1 tab) Daiy AT BEDTIME  Increase Furosemide to 40 mg Daily  Labs done today, your results will be available in MyChart, we will contact you for abnormal readings.  Your physician recommends that you have lab work done again in: 1-2 weeks  Your physician recommends that you schedule a follow-up appointment in: 1 month  If you have any questions or concerns before your next appointment please send [place] a message through [employer] or call our office at [phone].    TO LEAVE A MESSAGE FOR THE NURSE SELECT OPTION 2, PLEASE LEAVE A MESSAGE INCLUDING: . YOUR NAME . DATE OF BIRTH . CALL BACK NUMBER . REASON FOR CALL**this is important as we prioritize the call backs  YOU WILL RECEIVE A CALL BACK THE SAME DAY AS LONG AS YOU CALL BEFORE 4:00 PM  At the [facility], you and your health needs are our priority. As part of our continuing mission to provide you with exceptional heart care, we have created designated Provider Care Teams. These Care Teams include your primary Cardiologist (physician) and Advanced Practice Providers (APPs- Physician Assistants and Nurse Practitioners) who all work together to provide you with the care you need, when you need it.   You may see any of the following providers on your designated Care Team at your next follow up: Marland Kitchen Dr Arvilla Meres . Dr Marca Ancona . Dr Thornell Mule . Tonye Becket, NP . Robbie Lis, PA . Shanda Bumps [place],NP . Karle Plumber, PharmD   Please be sure to bring in all your medications bottles to every appointment.

## 2021-03-03 NOTE — Telephone Encounter (Signed)
-----   Message from Laurey Morale, MD sent at 03/03/2021  3:53 PM EDT ----- Increase KCl to 40 daily if he is taking 20 daily as ordered.

## 2021-03-03 NOTE — Telephone Encounter (Signed)
[name], [place]  03/03/2021 4:35 PM EDT Back to Top     Spoke with pt he verbalized understanding and is agreeable with plan.    Laurey Morale, MD  03/03/2021 3:53 PM EDT      Increase KCl to 40 daily if he is taking 20 daily as ordered.

## 2021-03-03 NOTE — Telephone Encounter (Signed)
New message     Returning a call to someone from today.  Patient dropped off some papers today for Dr Jens Som to fill out.  They are papers for his ins company to continue payments on his loan.  I think patient is out of work.  Not sure who called him.

## 2021-03-03 NOTE — Progress Notes (Signed)
ReDS Vest / Clip - 03/03/21 1100      ReDS Vest / Clip   Station Marker D    Ruler Value 28    ReDS Value Range High volume overload    ReDS Actual Value 42
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
## 2021-03-08 NOTE — Telephone Encounter (Signed)
Spoke with pt, questions regarding disability forms discussed.

## 2021-03-08 NOTE — Telephone Encounter (Signed)
Dr. Dallas Schimke patient.  I did this last month when Dr. Dallas Schimke out of town.

## 2021-03-08 NOTE — Telephone Encounter (Signed)
Did you receive a form?

## 2021-03-09 ENCOUNTER — Telehealth: Payer: Self-pay | Admitting: Cardiology

## 2021-03-09 NOTE — Telephone Encounter (Signed)
Patient dropped off form to be updated by provider, received back from provider on 5/25, form is ready to be picked up by patient in pickup bin at front desk of [facility] office.

## 2021-03-15 ENCOUNTER — Ambulatory Visit (HOSPITAL_COMMUNITY)
Admission: RE | Admit: 2021-03-15 | Discharge: 2021-03-15 | Disposition: A | Discharge status: Home / Self Care | Payer: Self-pay | Source: Ambulatory Visit | Attending: Cardiology | Admitting: Cardiology

## 2021-03-15 ENCOUNTER — Other Ambulatory Visit: Payer: Self-pay

## 2021-03-15 ENCOUNTER — Other Ambulatory Visit (HOSPITAL_COMMUNITY)
Admission: RE | Admit: 2021-03-15 | Discharge: 2021-03-15 | Disposition: A | Discharge status: Home / Self Care | Payer: Medicaid Other | Source: Ambulatory Visit | Attending: Cardiology | Admitting: Cardiology

## 2021-03-15 ENCOUNTER — Encounter (HOSPITAL_COMMUNITY): Payer: Self-pay

## 2021-03-15 DIAGNOSIS — I255 Ischemic cardiomyopathy: Secondary | ICD-10-CM | POA: Insufficient documentation

## 2021-03-15 DIAGNOSIS — I5022 Chronic systolic (congestive) heart failure: Secondary | ICD-10-CM | POA: Insufficient documentation

## 2021-03-15 LAB — BASIC METABOLIC PANEL
Anion gap: 9 (ref 5–15)
BUN: 10 mg/dL (ref 6–20)
CO2: 22 mmol/L (ref 22–32)
Calcium: 8.8 mg/dL — ABNORMAL LOW (ref 8.9–10.3)
Chloride: 104 mmol/L (ref 98–111)
Creatinine, Ser: 0.82 mg/dL (ref 0.61–1.24)
GFR, Estimated: >60 mL/min (ref >60)
Glucose, Bld: 112 mg/dL — ABNORMAL HIGH (ref 70–99)
Potassium: 3.7 mmol/L (ref 3.5–5.1)
Sodium: 135 mmol/L (ref 135–145)

## 2021-03-15 LAB — ECHOCARDIOGRAM COMPLETE
AR max vel: 2.91 cm2
AV Area VTI: 2.88 cm2
AV Area mean vel: 3.06 cm2
AV Mean grad: 4.4 mmHg
AV Peak grad: 8.7 mmHg
Ao pk vel: 1.47 m/s
Area-P 1/2: 3.17 cm2
Calc EF: 25.2 %
S' Lateral: 4.5 cm
Single Plane A2C EF: 19.5 %
Single Plane A4C EF: 32.4 %

## 2021-03-15 NOTE — Progress Notes (Signed)
*  PRELIMINARY RESULTS* Echocardiogram 2D Echocardiogram has been performed.  Stacey Drain 03/15/2021, 11:14 AM

## 2021-03-16 ENCOUNTER — Other Ambulatory Visit: Payer: Self-pay | Admitting: *Deleted

## 2021-03-16 DIAGNOSIS — I255 Ischemic cardiomyopathy: Secondary | ICD-10-CM

## 2021-03-21 ENCOUNTER — Encounter: Payer: Self-pay | Admitting: Internal Medicine

## 2021-03-21 ENCOUNTER — Ambulatory Visit (INDEPENDENT_AMBULATORY_CARE_PROVIDER_SITE_OTHER): Payer: Medicaid Other | Admitting: Internal Medicine

## 2021-03-21 ENCOUNTER — Other Ambulatory Visit: Payer: Self-pay

## 2021-03-21 VITALS — BP 100/64 | HR 79 | Ht 69.0 in | Wt 243.9 lb

## 2021-03-21 DIAGNOSIS — I255 Ischemic cardiomyopathy: Secondary | ICD-10-CM

## 2021-03-21 LAB — CBC WITH DIFFERENTIAL/PLATELET
Basophils Absolute: 0.1 10*3/uL (ref 0.0–0.2)
Basos: 1 %
EOS (ABSOLUTE): 0.3 10*3/uL (ref 0.0–0.4)
Eos: 3 %
Hematocrit: 43.4 % (ref 37.5–51.0)
Hemoglobin: 14.7 g/dL (ref 13.0–17.7)
Lymphocytes Absolute: 1.7 10*3/uL (ref 0.7–3.1)
Lymphs: 19 %
MCH: 29.3 pg (ref 26.6–33.0)
MCHC: 33.9 g/dL (ref 31.5–35.7)
MCV: 87 fL (ref 79–97)
Monocytes Absolute: 1 10*3/uL — ABNORMAL HIGH (ref 0.1–0.9)
Monocytes: 11 %
Neutrophils Absolute: 5.7 10*3/uL (ref 1.4–7.0)
Neutrophils: 66 %
Platelets: 374 10*3/uL (ref 150–450)
RBC: 5.01 x10E6/uL (ref 4.14–5.80)
RDW: 14.9 % (ref 11.6–15.4)
WBC: 8.7 10*3/uL (ref 3.4–10.8)

## 2021-03-21 NOTE — Patient Instructions (Addendum)
Medication Instructions:  Your physician recommends that you continue on your current medications as directed. Please refer to the Current Medication list given to you today.  Labwork: You will get lab work today:  CBC  Testing/Procedures: Your physician has recommended that you have a defibrillator inserted. An implantable cardioverter defibrillator (ICD) is a small device that is placed in your chest or, in rare cases, your abdomen. This device uses electrical pulses or shocks to help control life-threatening, irregular heartbeats that could lead the heart to suddenly stop beating (sudden cardiac arrest). Leads are attached to the ICD that goes into your heart. This is done in the hospital and usually requires an overnight stay. Please see the instruction sheet given to you today for more information.  Follow-Up:  SEE INSTRUCTION LETTER   Any Other Special Instructions Will Be Listed Below (If Applicable).  If you need a refill on your cardiac medications before your next appointment, please call your pharmacy.

## 2021-03-21 NOTE — Progress Notes (Signed)
HPI [name] is referred today by Dr. Jens Som and Shirlee Latch for consideration for ICD insertion. He is a pleasant 40 yo man with obesity, who has an ICM, s/p MI, s/p CABG. He has an EF of 30% and class 2 CHF. He has not had syncope. He is on GDMT and denies non-compliance. The patient denies anginal symptoms. He has not been able to lose weight. Allergies  Allergen Reactions  . Penicillins Anaphylaxis, Swelling and Rash    "Rash from head to toe, throat closed up" with amoxicillin at 40 years old  PCN reaction causing immediate rash, facial/tongue/throat swelling, SOB or lightheadedness with hypotension: Yes Has patient had a PCN reaction causing severe rash involving mucus membranes or skin necrosis: Yes Has patient had a PCN reaction that required hospitalization: No Has patient had a PCN reaction occurring within the last 10 years: No If all of the above answers are "NO", then may proceed with Cephalosporin use.  Marland Kitchen Penicillins     anaphylactic     Current Outpatient Medications  Medication Sig Dispense Refill  . aspirin 81 MG EC tablet Take 1 tablet (81 mg total) by mouth daily. Swallow whole. 90 tablet 0  . carvedilol (COREG) 6.25 MG tablet Take 1 tablet (6.25 mg total) by mouth 2 (two) times daily. 180 tablet 0  . clopidogrel (PLAVIX) 75 MG tablet Take 1 tablet (75 mg total) by mouth daily. 90 tablet 0  . dapagliflozin propanediol (FARXIGA) 10 MG TABS tablet Take 1 tablet (10 mg total) by mouth daily before breakfast. 30 tablet 6  . digoxin (LANOXIN) 0.125 MG tablet TAKE 1 TABLET BY MOUTH ONCE DAILY. 34 tablet 2  . ezetimibe (ZETIA) 10 MG tablet Take 1 tablet (10 mg total) by mouth daily. 90 tablet 0  . FLUoxetine (PROZAC) 20 MG tablet Take 20 mg by mouth daily.    . furosemide (LASIX) 40 MG tablet Take 1 tablet (40 mg total) by mouth daily. 30 tablet 2  . gabapentin (NEURONTIN) 300 MG capsule TAKE 1 CAPSULE BY MOUTH THREE TIMES A DAY 90 capsule 0  . potassium chloride SA  (KLOR-CON) 20 MEQ tablet Take 40 mEq by mouth daily.    . rosuvastatin (CRESTOR) 40 MG tablet Take 1 tablet (40 mg total) by mouth daily. 90 tablet 3  . sacubitril-valsartan (ENTRESTO) 24-26 MG TAKE 1 TABLET BY MOUTH 2 (TWO) TIMES DAILY. 60 tablet 3  . spironolactone (ALDACTONE) 25 MG tablet Take 1 tablet (25 mg total) by mouth at bedtime. 30 tablet 2  . traZODone (DESYREL) 50 MG tablet Take 0.5-1 tablets (25-50 mg total) by mouth at bedtime as needed for sleep. 90 tablet 0   No current facility-administered medications for this visit.     Past Medical History:  Diagnosis Date  . CAD (coronary artery disease)    inferiot MI; LHC with EF 55% and basal to mid inferior hypokinesis. totally occluded RCA w/weak L-R collaterals. 50% ostial OM1. long 70% proximal LAD. Pt had promus DES to LAD and RCA. echo 9/11: EF 55%, no regional wall motion abnormalities   . CHF (congestive heart failure) (HCC)   . GI bleed 9/11   secondary to mallory-weiss tear with endoscopic clipping   . Hyperlipidemia   . Hypertension   . Nephrolithiasis   . Transfusion history 2011/2012  . 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   Past Surgical History:  Procedure Laterality Date  . CARDIAC SURGERY    . CORONARY ARTERY BYPASS GRAFT N/A 06/24/2020   Procedure: CORONARY ARTERY BYPASS GRAFTING (CABG) x 6, bilateral IMA, LIMA TO LAD, RIMA TO PDA, LEFT RADIAL ARTERY TO DIAG. 1 SEQ TO OM1, SVG TO OM2, SVG TO ACUTE MARGINAL;  Surgeon: Linden Dolin, MD;  Location: [facility] OR;  Service: Open Heart Surgery;  Laterality: N/A;  . ENDOVEIN HARVEST OF GREATER SAPHENOUS VEIN Right 06/24/2020   Procedure: ENDOVEIN HARVEST OF GREATER SAPHENOUS VEIN;  Surgeon: Linden Dolin, MD;  Location: [facility] OR;  Service: Open Heart Surgery;  Laterality: Right;  . IABP INSERTION N/A 06/23/2020   Procedure: IABP Insertion;   Surgeon: Yvonne Kendall, MD;  Location: [facility] INVASIVE CV LAB;  Service: Cardiovascular;  Laterality: N/A;  . LEFT HEART CATH AND CORONARY ANGIOGRAPHY N/A 06/23/2020   Procedure: LEFT HEART CATH AND CORONARY ANGIOGRAPHY;  Surgeon: Yvonne Kendall, MD;  Location: [facility] INVASIVE CV LAB;  Service: Cardiovascular;  Laterality: N/A;  . patients states had two stent surgery to his heart  03/30/2010  . RADIAL ARTERY HARVEST Left 06/24/2020   Procedure: RADIAL ARTERY HARVEST;  Surgeon: Linden Dolin, MD;  Location: [facility] OR;  Service: Open Heart Surgery;  Laterality: Left;  . TEE WITHOUT CARDIOVERSION N/A 06/24/2020   Procedure: TRANSESOPHAGEAL ECHOCARDIOGRAM (TEE);  Surgeon: Linden Dolin, MD;  Location: [facility] OR;  Service: Open Heart Surgery;  Laterality: N/A;     Family History  Problem Relation Age of Onset  . Heart disease Father   . Coronary artery disease Other        noncontributory for early CAD and MI  . Rheum arthritis Mother   . Diabetes Mother   . Hyperlipidemia Mother   . Hypertension Mother   . Stroke Other      Social History   Socioeconomic History  . Marital status: Single    Spouse name: Not on file  . Number of children: 2  . Years of education: Not on file  . Highest education level: Not on file  Occupational History  . Not on file  Tobacco Use  . Smoking status: Former Smoker    Packs/day: 1.50  . Smokeless tobacco: Never Used  . Tobacco comment: quit september 2021  Vaping Use  . Vaping Use: Never used  Substance and Sexual Activity  . Alcohol use: Yes    Comment: occasional   . Drug use: No  . Sexual activity: Not on file  Other Topics Concern  . Not on file  Social History Narrative   Married; lives in [place].   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 Difficulty of Paying Living Expenses: Hard  Food Insecurity: No Food Insecurity  . Worried About Programme researcher, broadcasting/film/video in  the Last Year: Never true  . Ran Out of Food in the Last Year: Never true  Transportation Needs: No Transportation Needs  . Lack of Transportation (Medical): No  . Lack of Transportation (Non-Medical): No  Physical Activity: Not on file  Stress: Not on file  Social Connections: Not on file  Intimate Partner Violence: Not on file     BP 100/64   Pulse 79   Ht 5\' 9"  (1.753 m)   Wt 243 lb 13.9 oz (110.6 kg)   SpO2 95%   BMI 36.01 kg/m   Physical Exam:  Well appearing NAD HEENT: Unremarkable Neck:  No JVD, no thyromegally Lymphatics:  No adenopathy Back:  No CVA tenderness Lungs:  Clear with no wheezes HEART:  Regular rate rhythm, no murmurs, no rubs, no clicks Abd:  soft, positive bowel sounds, no organomegally, no rebound, no guarding Ext:  2 plus pulses, no edema, no cyanosis, no clubbing Skin:  No rashes no nodules Neuro:  CN II through XII intact, motor grossly intact  EKG - nsr   Assess/Plan: 1. ICM - he is s/p MI and has severe LV dysfunction and class 2 CHF symptoms. He is on maximal GDMT. I have discussed the indications/risks/benefits/goals/expectations of ICD Insertion and he would like to proceed. 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  3. HTN - his bp is well controlled.  4. Obesity - he is encouraged to lose weight.   Sharlot Gowda Gilverto Dileonardo,MD

## 2021-03-22 ENCOUNTER — Telehealth (HOSPITAL_COMMUNITY): Payer: Self-pay | Admitting: Licensed Clinical Social Worker

## 2021-03-22 ENCOUNTER — Telehealth: Payer: Self-pay

## 2021-03-22 NOTE — Telephone Encounter (Signed)
Outreach made to [facility] finances to see if Pt's Medicaid family planning would cover ICD placement.  Per [facility]-Pt would be self pay.  Cost of procedure is [phone] with about 30% out of pocket required prior to procedure.  Advised Pt of findings.  Pt unable to cover this cost.  Pt states he has filled out paperwork for assistance with [facility].  Also advised that this nurse has enlisted help of Child psychotherapist to evaluate for other means of assistance/insurance.  Will cancel ICD placement for now and continue to monitor for financial assistance.

## 2021-03-22 NOTE — Telephone Encounter (Signed)
CSW received call from pt stating that he was supposed to get ICD this Friday but that it was canceled because he is uninsured and they would require $9,000 up front.  He was still interested in pursing the procedure then applying for CAFA and hopefully getting it reimbursed if he was approved for assistance.  Was wanting CSW to follow up with [facility] st team to see if he could still move forward with procedure.  CSW called Roney Mans at [facility] to discuss- cannot schedule pt until he has a payor source or can make payment up front but can reschedule pt quickly after an approval for CAFA is received.  CSW updated pt.  He confirms he still has CAFA which CSW has supplied him several times and has been gathering documents.  CSW encouraged him to complete ASAP and mail to address on application.  Will need to make sure he has most recent forms as possible or else it could delay processing- pt expressed understanding.  Encouraged pt to mail in then call [facility] 2 weeks after submitting to check on status of application and make sure there are no missing documents.  Informed that once he is approved he can call back [facility] st to inform and get rescheduled for ICD placement- pt expressed understanding.  Will continue to follow and assist as needed  Burna Sis, LCSW Clinical Social Worker [facility] Desk#: [phone] Cell#: [phone]

## 2021-03-24 ENCOUNTER — Encounter (HOSPITAL_COMMUNITY): Admission: RE | Payer: Self-pay | Source: Home / Self Care

## 2021-03-24 ENCOUNTER — Ambulatory Visit (HOSPITAL_COMMUNITY): Admission: RE | Admit: 2021-03-24 | Payer: Medicaid Other | Source: Home / Self Care | Admitting: Internal Medicine

## 2021-03-24 SURGERY — ICD IMPLANT
Anesthesia: LOCAL

## 2021-03-29 ENCOUNTER — Other Ambulatory Visit (HOSPITAL_COMMUNITY): Payer: Medicaid Other
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
## 2021-04-03 ENCOUNTER — Ambulatory Visit (HOSPITAL_COMMUNITY)
Admission: RE | Admit: 2021-04-03 | Discharge: 2021-04-03 | Disposition: A | Discharge status: Home / Self Care | Payer: Medicaid Other | Source: Ambulatory Visit | Attending: Family Medicine | Admitting: Family Medicine

## 2021-04-03 ENCOUNTER — Encounter (HOSPITAL_COMMUNITY): Payer: Self-pay

## 2021-04-03 ENCOUNTER — Other Ambulatory Visit: Payer: Self-pay

## 2021-04-03 ENCOUNTER — Other Ambulatory Visit (HOSPITAL_COMMUNITY): Payer: Self-pay

## 2021-04-03 VITALS — BP 128/70 | HR 60 | Wt 242.6 lb

## 2021-04-03 DIAGNOSIS — I2510 Atherosclerotic heart disease of native coronary artery without angina pectoris: Secondary | ICD-10-CM

## 2021-04-03 DIAGNOSIS — E785 Hyperlipidemia, unspecified: Secondary | ICD-10-CM | POA: Insufficient documentation

## 2021-04-03 DIAGNOSIS — Z79899 Other long term (current) drug therapy: Secondary | ICD-10-CM | POA: Insufficient documentation

## 2021-04-03 DIAGNOSIS — Z87891 Personal history of nicotine dependence: Secondary | ICD-10-CM | POA: Insufficient documentation

## 2021-04-03 DIAGNOSIS — Z7982 Long term (current) use of aspirin: Secondary | ICD-10-CM | POA: Insufficient documentation

## 2021-04-03 DIAGNOSIS — Z7902 Long term (current) use of antithrombotics/antiplatelets: Secondary | ICD-10-CM | POA: Insufficient documentation

## 2021-04-03 DIAGNOSIS — I252 Old myocardial infarction: Secondary | ICD-10-CM | POA: Insufficient documentation

## 2021-04-03 DIAGNOSIS — G4733 Obstructive sleep apnea (adult) (pediatric): Secondary | ICD-10-CM

## 2021-04-03 DIAGNOSIS — I110 Hypertensive heart disease with heart failure: Secondary | ICD-10-CM | POA: Insufficient documentation

## 2021-04-03 DIAGNOSIS — Z955 Presence of coronary angioplasty implant and graft: Secondary | ICD-10-CM | POA: Insufficient documentation

## 2021-04-03 DIAGNOSIS — I5022 Chronic systolic (congestive) heart failure: Secondary | ICD-10-CM | POA: Insufficient documentation

## 2021-04-03 DIAGNOSIS — I255 Ischemic cardiomyopathy: Secondary | ICD-10-CM | POA: Insufficient documentation

## 2021-04-03 DIAGNOSIS — Z951 Presence of aortocoronary bypass graft: Secondary | ICD-10-CM | POA: Insufficient documentation

## 2021-04-03 LAB — BASIC METABOLIC PANEL
Anion gap: 11 (ref 5–15)
BUN: 11 mg/dL (ref 6–20)
CO2: 22 mmol/L (ref 22–32)
Calcium: 8.9 mg/dL (ref 8.9–10.3)
Chloride: 104 mmol/L (ref 98–111)
Creatinine, Ser: 0.88 mg/dL (ref 0.61–1.24)
GFR, Estimated: >60 mL/min (ref >60)
Glucose, Bld: 97 mg/dL (ref 70–99)
Potassium: 3.4 mmol/L — ABNORMAL LOW (ref 3.5–5.1)
Sodium: 137 mmol/L (ref 135–145)

## 2021-04-03 MED ORDER — ENTRESTO 49-51 MG PO TABS
1.0000 | ORAL_TABLET | Freq: Two times a day (BID) | ORAL | 11 refills | Status: DC
  Filled 2021-04-03: qty 60, 30d supply, fill #0

## 2021-04-03 NOTE — Progress Notes (Signed)
ReDS Vest / Clip - 04/03/21 1100       ReDS Vest / Clip   Station Marker D    Ruler Value 33.5    ReDS Value Range Moderate volume overload    ReDS Actual Value 40

## 2021-04-03 NOTE — Patient Instructions (Addendum)
Labs done today. We will contact you only if your labs are abnormal.  No other medication changes were made. Please continue all current medications as prescribed.  Your physician recommends that you schedule a follow-up appointment in: 10 days for a lab only appointment at [facility] and in 1 month with our APP Clinic here in our office and in 2 months with Dr. Shirlee Latch  If you have any questions or concerns before your next appointment please send [place] a message through [place] or call our office at [phone].    TO LEAVE A MESSAGE FOR THE NURSE SELECT OPTION 2, PLEASE LEAVE A MESSAGE INCLUDING: YOUR NAME DATE OF BIRTH CALL BACK NUMBER REASON FOR CALL**this is important as we prioritize the call backs  YOU WILL RECEIVE A CALL BACK THE SAME DAY AS LONG AS YOU CALL BEFORE 4:00 PM   Do the following things EVERYDAY: Weigh yourself in the morning before breakfast. Write it down and keep it in a log. Take your medicines as prescribed Eat low salt foods--Limit salt (sodium) to 2000 mg per day.  Stay as active as you can everyday Limit all fluids for the day to less than 2 liters   At the [facility], you and your health needs are our priority. As part of our continuing mission to provide you with exceptional heart care, we have created designated Provider Care Teams. These Care Teams include your primary Cardiologist (physician) and Advanced Practice Providers (APPs- Physician Assistants and Nurse Practitioners) who all work together to provide you with the care you need, when you need it.   You may see any of the following providers on your designated Care Team at your next follow up: Dr Arvilla Meres Dr Carron Curie, NP Robbie Lis, [place] Karle Plumber, PharmD   Please be sure to bring in all your medications bottles to every appointment.

## 2021-04-04 ENCOUNTER — Other Ambulatory Visit (HOSPITAL_COMMUNITY): Payer: Self-pay

## 2021-04-05 ENCOUNTER — Other Ambulatory Visit (HOSPITAL_COMMUNITY): Payer: Self-pay

## 2021-04-05 ENCOUNTER — Telehealth: Payer: Self-pay | Admitting: Licensed Clinical Social Worker

## 2021-04-05 ENCOUNTER — Telehealth (HOSPITAL_COMMUNITY): Payer: Self-pay | Admitting: Pharmacy Technician

## 2021-04-05 NOTE — Telephone Encounter (Signed)
LCSW attempted to reach out to pt at [phone]. No answer, follow up message left for pt regarding CAFA. My colleague Eileen Stanford, [place], has f/u with pt over the past few months. Pt denied for Medicaid and in order to get procedures/ongoing coverage for care he is eligible for [facility] Financial Assistance- per notes has been given this document.  [name], MSW, LCSW [facility] Heart/Vascular Care Navigation  [phone]

## 2021-04-05 NOTE — Telephone Encounter (Signed)
Advanced Heart Failure Patient Advocate Encounter  Patient was temporarily approved to receive Farxiga from [employer] through September 5th. [employer] wants the patient to apply and be denied for Medicaid before they are willing to give a full approval.   The patient should receive his first 30 day supply by Friday 6/24. The patient would have to call and get a refill every 30 days until September.  The patient stated that he has been denied for Medicaid and will bring that letter in with him during his next visit in July.   Will fax in documentation once received.

## 2021-04-06 ENCOUNTER — Ambulatory Visit: Payer: Medicaid Other

## 2021-04-07 ENCOUNTER — Encounter: Payer: Self-pay | Admitting: Physician Assistant

## 2021-04-07 ENCOUNTER — Other Ambulatory Visit: Payer: Self-pay

## 2021-04-07 ENCOUNTER — Telehealth: Payer: Self-pay | Admitting: Licensed Clinical Social Worker

## 2021-04-07 ENCOUNTER — Other Ambulatory Visit: Payer: Self-pay | Admitting: Physician Assistant

## 2021-04-07 NOTE — Telephone Encounter (Signed)
Additional call placed to pt at [phone]. No answer, message left reminding pt that our team remains available to assist with [facility] Financial Aid as needed.   Octavio Graves, MSW, LCSW [facility] Heart/Vascular Care Navigation  [phone]

## 2021-04-10 MED ORDER — ASPIRIN 81 MG PO TBEC: 81.0000 mg | DELAYED_RELEASE_TABLET | Freq: Every day | ORAL | 0 refills | Status: DC

## 2021-04-12 ENCOUNTER — Other Ambulatory Visit (HOSPITAL_COMMUNITY): Payer: Self-pay

## 2021-04-12 MED ORDER — GABAPENTIN 300 MG PO CAPS
300.0000 mg | ORAL_CAPSULE | Freq: Three times a day (TID) | ORAL | 0 refills | Status: DC
  Filled 2021-04-12: qty 90, 30d supply, fill #0

## 2021-04-13 ENCOUNTER — Other Ambulatory Visit (HOSPITAL_COMMUNITY): Payer: Self-pay

## 2021-04-19 ENCOUNTER — Other Ambulatory Visit (HOSPITAL_COMMUNITY): Payer: Self-pay

## 2021-04-28 ENCOUNTER — Telehealth: Payer: Self-pay | Admitting: Licensed Clinical Social Worker

## 2021-04-28 NOTE — Telephone Encounter (Signed)
Received response from Cecilia, Occupational psychologist. He has not yet received pt application, confirmed it is taking 2+ weeks to receive and process applications. He also confirmed pt can call customer service number to receive updates [phone].   I relayed the above to pt via text as requested.   [name], MSW, LCSW [facility] Heart/Vascular Care Navigation  [phone]

## 2021-04-28 NOTE — Telephone Encounter (Signed)
LCSW received message from Ancil Boozer, RN, w/ [facility] office.  Pt had attempted to call this writer while I was on another call, no message left on my voicemail. Pt shared with Boneta Lucks that he had turned in his CAFA application to business office a few weeks ago, inquiring about status. No notes on account noting that application was received/processed at this time.   LCSW called pt back and was able to reach him at [phone].  Introduced self, role, reason for call as pt had not returned my calls/been introduced. Inquired when pt had submitted application, when he checks he thinks it was roughly aorund 6/30. LCSW shared w/ pt that applications are taking 2+ weeks to be received/processed due to staffing and volume. I offered to reach out to customer service rep today and provide pt with that number moving forward so that he could reach out to them for further updates. I let pt know that once it is received if it is missing any documentation they will be in contact with him to remedy/return the missing documents within a set amount of time. If he is approved/denied he will also receive a letter noting that. Pt states understanding, open to receiving a text whether or not it has been received and with billing department number.   Inquired otherwise how pt was doing- he shares it is frustrating to be out of work and he wishes he could get back to previous routine. LCSW provided verbal support for these frustrations. Encouraged him to continue taking medications as prescribed, keep up with appointments at [facility] clinic and [facility] and let me know if he has any additional questions/concerns.   LCSW has reached out to [place], customer service representative to see if application has yet been received.   Octavio Graves, MSW, LCSW [facility] Heart/Vascular Care Navigation  [phone]

## 2021-05-01 ENCOUNTER — Other Ambulatory Visit: Payer: Self-pay

## 2021-05-01 ENCOUNTER — Other Ambulatory Visit (HOSPITAL_COMMUNITY)
Admission: RE | Admit: 2021-05-01 | Discharge: 2021-05-01 | Disposition: A | Discharge status: Home / Self Care | Payer: Medicaid Other | Source: Ambulatory Visit | Attending: Family Medicine | Admitting: Family Medicine

## 2021-05-01 DIAGNOSIS — I5022 Chronic systolic (congestive) heart failure: Secondary | ICD-10-CM | POA: Insufficient documentation

## 2021-05-01 LAB — BASIC METABOLIC PANEL
Anion gap: 9 (ref 5–15)
BUN: 11 mg/dL (ref 6–20)
CO2: 25 mmol/L (ref 22–32)
Calcium: 8.8 mg/dL — ABNORMAL LOW (ref 8.9–10.3)
Chloride: 102 mmol/L (ref 98–111)
Creatinine, Ser: 0.99 mg/dL (ref 0.61–1.24)
GFR, Estimated: >60 mL/min (ref >60)
Glucose, Bld: 108 mg/dL — ABNORMAL HIGH (ref 70–99)
Potassium: 4 mmol/L (ref 3.5–5.1)
Sodium: 136 mmol/L (ref 135–145)
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
## 2021-05-03 ENCOUNTER — Encounter (HOSPITAL_COMMUNITY): Payer: Self-pay

## 2021-05-03 ENCOUNTER — Other Ambulatory Visit (HOSPITAL_COMMUNITY): Payer: Self-pay | Admitting: Family Medicine

## 2021-05-03 ENCOUNTER — Ambulatory Visit (HOSPITAL_COMMUNITY)
Admission: RE | Admit: 2021-05-03 | Discharge: 2021-05-03 | Disposition: A | Discharge status: Home / Self Care | Payer: Self-pay | Source: Ambulatory Visit | Attending: Family Medicine | Admitting: Family Medicine

## 2021-05-03 ENCOUNTER — Other Ambulatory Visit: Payer: Self-pay

## 2021-05-03 VITALS — BP 116/78 | HR 73 | Wt 241.8 lb

## 2021-05-03 DIAGNOSIS — Z7982 Long term (current) use of aspirin: Secondary | ICD-10-CM | POA: Insufficient documentation

## 2021-05-03 DIAGNOSIS — Z951 Presence of aortocoronary bypass graft: Secondary | ICD-10-CM | POA: Insufficient documentation

## 2021-05-03 DIAGNOSIS — Z7984 Long term (current) use of oral hypoglycemic drugs: Secondary | ICD-10-CM | POA: Insufficient documentation

## 2021-05-03 DIAGNOSIS — Z8249 Family history of ischemic heart disease and other diseases of the circulatory system: Secondary | ICD-10-CM | POA: Insufficient documentation

## 2021-05-03 DIAGNOSIS — I5023 Acute on chronic systolic (congestive) heart failure: Secondary | ICD-10-CM

## 2021-05-03 DIAGNOSIS — G4733 Obstructive sleep apnea (adult) (pediatric): Secondary | ICD-10-CM

## 2021-05-03 DIAGNOSIS — Z7902 Long term (current) use of antithrombotics/antiplatelets: Secondary | ICD-10-CM | POA: Insufficient documentation

## 2021-05-03 DIAGNOSIS — I252 Old myocardial infarction: Secondary | ICD-10-CM | POA: Insufficient documentation

## 2021-05-03 DIAGNOSIS — Z79899 Other long term (current) drug therapy: Secondary | ICD-10-CM | POA: Insufficient documentation

## 2021-05-03 DIAGNOSIS — I110 Hypertensive heart disease with heart failure: Secondary | ICD-10-CM | POA: Insufficient documentation

## 2021-05-03 DIAGNOSIS — I2510 Atherosclerotic heart disease of native coronary artery without angina pectoris: Secondary | ICD-10-CM

## 2021-05-03 DIAGNOSIS — Z87891 Personal history of nicotine dependence: Secondary | ICD-10-CM

## 2021-05-03 DIAGNOSIS — I255 Ischemic cardiomyopathy: Secondary | ICD-10-CM

## 2021-05-03 LAB — BASIC METABOLIC PANEL
Anion gap: 10 (ref 5–15)
BUN: 10 mg/dL (ref 6–20)
CO2: 23 mmol/L (ref 22–32)
Calcium: 8.9 mg/dL (ref 8.9–10.3)
Chloride: 103 mmol/L (ref 98–111)
Creatinine, Ser: 0.95 mg/dL (ref 0.61–1.24)
GFR, Estimated: >60 mL/min (ref >60)
Glucose, Bld: 92 mg/dL (ref 70–99)
Potassium: 3.9 mmol/L (ref 3.5–5.1)
Sodium: 136 mmol/L (ref 135–145)

## 2021-05-03 LAB — DIGOXIN LEVEL: Digoxin Level: <0.2 ng/mL — ABNORMAL LOW (ref 0.8–2.0)

## 2021-05-03 LAB — CBC
HCT: 44.1 % (ref 39.0–52.0)
Hemoglobin: 14.9 g/dL (ref 13.0–17.0)
MCH: 29.3 pg (ref 26.0–34.0)
MCHC: 33.8 g/dL (ref 30.0–36.0)
MCV: 86.6 fL (ref 80.0–100.0)
Platelets: 358 10*3/uL (ref 150–400)
RBC: 5.09 MIL/uL (ref 4.22–5.81)
RDW: 13.7 % (ref 11.5–15.5)
WBC: 8.9 10*3/uL (ref 4.0–10.5)
nRBC: 0 % (ref 0.0–0.2)

## 2021-05-03 MED ORDER — ENTRESTO 97-103 MG PO TABS: 1.0000 | ORAL_TABLET | Freq: Two times a day (BID) | ORAL | 11 refills | Status: DC

## 2021-05-03 MED ORDER — SACUBITRIL-VALSARTAN 49-51 MG PO TABS: 1.0000 | ORAL_TABLET | Freq: Two times a day (BID) | ORAL | Status: DC

## 2021-05-03 NOTE — Progress Notes (Signed)
CSW met with patient in the clinic. Patient asking about the status of his CAFA application. CSW encouraged patient to reach out to Patient accounting and CSW also contacted to inquire about status update.   CSW informed by Patient Accounting that the CAFA application has not been received yet but that it can be in the office just not logged in yet. She stated to allow another week for processing. CSW contacted patient to inform of follow up needed next week. Patient verbalizes understanding and will call next week. CSW available as needed. Raquel Sarna, [place], [place]

## 2021-05-03 NOTE — Progress Notes (Signed)
ReDS Vest / Clip - 05/03/21 1200       ReDS Vest / Clip   Station Marker D    Ruler Value 33.5    ReDS Value Range High volume overload    ReDS Actual Value 44

## 2021-05-03 NOTE — Patient Instructions (Addendum)
INCREASE Lasix to 40 mg twice a day for 5 days, the resume normal dose of 40 mg daily thereafter INCREASE Potassium to 60 meq daily for 5 days, then resume normal dose of 40 meq daily INCREASE Entresto to 97/103 mg, one tab twice a day  Labs today We will only contact you if something comes back abnormal or we need to make some changes. Otherwise no news is good news!   You are scheduled for a Cardiac Catheterization on Thursday, July 28 with Dr. Marca Ancona.  1. Please arrive at the [facility] (Main Entrance A) at [facility]: [address] [place], [place] [address] at 7:00 AM (This time is two hours before your procedure to ensure your preparation). Free valet parking service is available.   Special note: Every effort is made to have your procedure done on time. Please understand that emergencies sometimes delay scheduled procedures.  2. Diet: Do not eat solid foods after midnight.  The patient may have clear liquids until 5am upon the day of the procedure.  3. Labs: pre procedure labs done 05/03/2021    4. Medication instructions in preparation for your procedure:   Contrast Allergy: No     Stop taking, Lasix (Furosemide)  Thursday, July 28,    On the morning of your procedure, take your Aspirin and any morning medicines NOT listed above.  You may use sips of water.  5. Plan for one night stay--bring personal belongings. 6. Bring a current list of your medications and current insurance cards. 7. You MUST have a responsible person to drive you home. 8. Someone MUST be with you the first 24 hours after you arrive home or your discharge will be delayed. 9. Please wear clothes that are easy to get on and off and wear slip-on shoes.  Thank you for allowing [place] to care for you!   -- [facility]

## 2021-05-08 ENCOUNTER — Other Ambulatory Visit (HOSPITAL_COMMUNITY): Payer: Self-pay

## 2021-05-08 DIAGNOSIS — I5022 Chronic systolic (congestive) heart failure: Secondary | ICD-10-CM

## 2021-05-08 MED ORDER — SODIUM CHLORIDE 0.9% FLUSH
3.0000 mL | Freq: Two times a day (BID) | INTRAVENOUS | Status: AC
Start: 2021-05-08 — End: ?

## 2021-05-08 NOTE — Telephone Encounter (Signed)
I called patient he has enough for now, will call when refill needed.

## 2021-05-10 ENCOUNTER — Other Ambulatory Visit: Payer: Self-pay | Admitting: Physician Assistant

## 2021-05-10 DIAGNOSIS — E785 Hyperlipidemia, unspecified: Secondary | ICD-10-CM

## 2021-05-10 DIAGNOSIS — I255 Ischemic cardiomyopathy: Secondary | ICD-10-CM

## 2021-05-11 ENCOUNTER — Encounter (HOSPITAL_COMMUNITY)
Admission: RE | Disposition: A | Discharge status: Home / Self Care | Payer: Self-pay | Source: Home / Self Care | Attending: Cardiology

## 2021-05-11 ENCOUNTER — Other Ambulatory Visit (HOSPITAL_COMMUNITY): Payer: Self-pay

## 2021-05-11 ENCOUNTER — Other Ambulatory Visit: Payer: Self-pay

## 2021-05-11 ENCOUNTER — Other Ambulatory Visit (HOSPITAL_COMMUNITY): Payer: Self-pay | Admitting: Cardiology

## 2021-05-11 ENCOUNTER — Encounter (HOSPITAL_COMMUNITY): Payer: Self-pay | Admitting: Cardiology

## 2021-05-11 ENCOUNTER — Ambulatory Visit (HOSPITAL_COMMUNITY)
Admission: RE | Admit: 2021-05-11 | Discharge: 2021-05-11 | Disposition: A | Discharge status: Home / Self Care | Payer: Medicaid Other | Attending: Cardiology | Admitting: Cardiology

## 2021-05-11 DIAGNOSIS — Z7902 Long term (current) use of antithrombotics/antiplatelets: Secondary | ICD-10-CM | POA: Insufficient documentation

## 2021-05-11 DIAGNOSIS — I2510 Atherosclerotic heart disease of native coronary artery without angina pectoris: Secondary | ICD-10-CM | POA: Insufficient documentation

## 2021-05-11 DIAGNOSIS — I255 Ischemic cardiomyopathy: Secondary | ICD-10-CM | POA: Insufficient documentation

## 2021-05-11 DIAGNOSIS — Z951 Presence of aortocoronary bypass graft: Secondary | ICD-10-CM | POA: Insufficient documentation

## 2021-05-11 DIAGNOSIS — Z79899 Other long term (current) drug therapy: Secondary | ICD-10-CM | POA: Insufficient documentation

## 2021-05-11 DIAGNOSIS — Z7982 Long term (current) use of aspirin: Secondary | ICD-10-CM | POA: Insufficient documentation

## 2021-05-11 DIAGNOSIS — Z7984 Long term (current) use of oral hypoglycemic drugs: Secondary | ICD-10-CM | POA: Insufficient documentation

## 2021-05-11 DIAGNOSIS — I509 Heart failure, unspecified: Secondary | ICD-10-CM

## 2021-05-11 DIAGNOSIS — Z87891 Personal history of nicotine dependence: Secondary | ICD-10-CM | POA: Insufficient documentation

## 2021-05-11 DIAGNOSIS — I110 Hypertensive heart disease with heart failure: Secondary | ICD-10-CM | POA: Insufficient documentation

## 2021-05-11 DIAGNOSIS — I5022 Chronic systolic (congestive) heart failure: Secondary | ICD-10-CM

## 2021-05-11 DIAGNOSIS — I5023 Acute on chronic systolic (congestive) heart failure: Secondary | ICD-10-CM | POA: Insufficient documentation

## 2021-05-11 HISTORY — PX: RIGHT HEART CATH: CATH118263

## 2021-05-11 LAB — POCT I-STAT EG7
Acid-Base Excess: 0 mmol/L (ref 0.0–2.0)
Acid-Base Excess: 1 mmol/L (ref 0.0–2.0)
Bicarbonate: 25.4 mmol/L (ref 20.0–28.0)
Bicarbonate: 26.1 mmol/L (ref 20.0–28.0)
Calcium, Ion: 1.22 mmol/L (ref 1.15–1.40)
Calcium, Ion: 1.23 mmol/L (ref 1.15–1.40)
HCT: 39 % (ref 39.0–52.0)
HCT: 40 % (ref 39.0–52.0)
Hemoglobin: 13.3 g/dL (ref 13.0–17.0)
Hemoglobin: 13.6 g/dL (ref 13.0–17.0)
O2 Saturation: 74 %
O2 Saturation: 75 %
Potassium: 3.6 mmol/L (ref 3.5–5.1)
Potassium: 3.6 mmol/L (ref 3.5–5.1)
Sodium: 141 mmol/L (ref 135–145)
Sodium: 141 mmol/L (ref 135–145)
TCO2: 27 mmol/L (ref 22–32)
TCO2: 27 mmol/L (ref 22–32)
pCO2, Ven: 42.3 mmHg — ABNORMAL LOW (ref 44.0–60.0)
pCO2, Ven: 43.4 mmHg — ABNORMAL LOW (ref 44.0–60.0)
pH, Ven: 7.386 (ref 7.250–7.430)
pH, Ven: 7.387 (ref 7.250–7.430)
pO2, Ven: 40 mmHg (ref 32.0–45.0)
pO2, Ven: 41 mmHg (ref 32.0–45.0)

## 2021-05-11 SURGERY — RIGHT HEART CATH
Anesthesia: LOCAL

## 2021-05-11 MED ORDER — FUROSEMIDE 40 MG PO TABS
40.0000 mg | ORAL_TABLET | Freq: Two times a day (BID) | ORAL | 2 refills | Status: DC
  Filled 2021-05-11 (×2): qty 30, 15d supply, fill #0

## 2021-05-11 MED ORDER — LIDOCAINE HCL (PF) 1 % IJ SOLN
INTRAMUSCULAR | Status: DC | PRN
  Administered 2021-05-11: 2 mL

## 2021-05-11 MED ORDER — MIDAZOLAM HCL 2 MG/2ML IJ SOLN
INTRAMUSCULAR | Status: AC
  Filled 2021-05-11: qty 2

## 2021-05-11 MED ORDER — ONDANSETRON HCL 4 MG/2ML IJ SOLN: 4.0000 mg | Freq: Four times a day (QID) | INTRAMUSCULAR | Status: DC | PRN

## 2021-05-11 MED ORDER — SODIUM CHLORIDE 0.9% FLUSH: 3.0000 mL | Freq: Two times a day (BID) | INTRAVENOUS | Status: DC

## 2021-05-11 MED ORDER — FUROSEMIDE 20 MG PO TABS
40.0000 mg | ORAL_TABLET | Freq: Two times a day (BID) | ORAL | 3 refills | Status: DC
  Filled 2021-05-11: qty 120, 30d supply, fill #0

## 2021-05-11 MED ORDER — SODIUM CHLORIDE 0.9 % IV SOLN: INTRAVENOUS | Status: DC

## 2021-05-11 MED ORDER — SODIUM CHLORIDE 0.9% FLUSH: 3.0000 mL | INTRAVENOUS | Status: DC | PRN

## 2021-05-11 MED ORDER — HEPARIN (PORCINE) IN NACL 1000-0.9 UT/500ML-% IV SOLN
INTRAVENOUS | Status: DC | PRN
  Administered 2021-05-11: 500 mL

## 2021-05-11 MED ORDER — POTASSIUM CHLORIDE CRYS ER 20 MEQ PO TBCR
60.0000 meq | EXTENDED_RELEASE_TABLET | Freq: Every day | ORAL | 3 refills | Status: DC
  Filled 2021-05-11: qty 60, 20d supply, fill #0

## 2021-05-11 MED ORDER — ASPIRIN 81 MG PO CHEW
81.0000 mg | CHEWABLE_TABLET | ORAL | Status: AC
  Administered 2021-05-11: 81 mg via ORAL
  Filled 2021-05-11: qty 1

## 2021-05-11 MED ORDER — LABETALOL HCL 5 MG/ML IV SOLN: 10.0000 mg | INTRAVENOUS | Status: DC | PRN

## 2021-05-11 MED ORDER — SODIUM CHLORIDE 0.9 % IV SOLN: 250.0000 mL | INTRAVENOUS | Status: DC | PRN

## 2021-05-11 MED ORDER — LIDOCAINE HCL (PF) 1 % IJ SOLN
INTRAMUSCULAR | Status: AC
  Filled 2021-05-11: qty 30

## 2021-05-11 MED ORDER — HYDRALAZINE HCL 20 MG/ML IJ SOLN: 10.0000 mg | INTRAMUSCULAR | Status: DC | PRN

## 2021-05-11 MED ORDER — FENTANYL CITRATE (PF) 100 MCG/2ML IJ SOLN
INTRAMUSCULAR | Status: AC
  Filled 2021-05-11: qty 2

## 2021-05-11 MED ORDER — ACETAMINOPHEN 325 MG PO TABS: 650.0000 mg | ORAL_TABLET | ORAL | Status: DC | PRN

## 2021-05-11 MED ORDER — POTASSIUM CHLORIDE CRYS ER 20 MEQ PO TBCR
40.0000 meq | EXTENDED_RELEASE_TABLET | Freq: Every day | ORAL | 3 refills | Status: DC
  Filled 2021-05-11: qty 60, 30d supply, fill #0

## 2021-05-11 MED ORDER — POTASSIUM CHLORIDE CRYS ER 20 MEQ PO TBCR
60.0000 meq | EXTENDED_RELEASE_TABLET | Freq: Every day | ORAL | 0 refills | Status: DC
  Filled 2021-05-11 (×3): qty 3, 1d supply, fill #0

## 2021-05-11 MED ORDER — HEPARIN (PORCINE) IN NACL 1000-0.9 UT/500ML-% IV SOLN
INTRAVENOUS | Status: AC
  Filled 2021-05-11: qty 500

## 2021-05-11 SURGICAL SUPPLY — 5 items
CATH BALLN WEDGE 5F 110CM (CATHETERS) ×1 IMPLANT
KIT HEART LEFT (KITS) ×2 IMPLANT
PACK CARDIAC CATHETERIZATION (CUSTOM PROCEDURE TRAY) ×2 IMPLANT
SHEATH GLIDE SLENDER 4/5FR (SHEATH) ×1 IMPLANT
TRANSDUCER W/STOPCOCK (MISCELLANEOUS) ×2 IMPLANT

## 2021-05-11 NOTE — Research (Signed)
Spoke with patient and his sister about the [employer] HF study.  Gave patient information pamphlet.  Gave contact information.  Explained to patient that we will have to wait until he gets his ICD and then 90 days post that.  Patient has an appt in Aug with Shirlee Latch so will drop by appt and touch base with him about study.

## 2021-05-11 NOTE — Progress Notes (Signed)
Discharge instructions reviewed with pt and his sister, Both voice understanding.

## 2021-05-11 NOTE — Interval H&P Note (Signed)
History and Physical Interval Note:  05/11/2021 11:09 AM  [name]  has presented today for surgery, with the diagnosis of heart failure.  The various methods of treatment have been discussed with the patient and family. After consideration of risks, benefits and other options for treatment, the patient has consented to  Procedure(s): RIGHT HEART CATH (N/A) as a surgical intervention.  The patient's history has been reviewed, patient examined, no change in status, stable for surgery.  I have reviewed the patient's chart and labs.  Questions were answered to the patient's satisfaction.     Brynlyn Dade [employer]

## 2021-05-11 NOTE — Discharge Instructions (Addendum)
1.  Keep Lasix at 40 mg twice a day.  2.  Keep potassium at 60 mEq daily.

## 2021-05-16 ENCOUNTER — Ambulatory Visit: Payer: Medicaid Other | Admitting: Physician Assistant

## 2021-05-16 ENCOUNTER — Encounter (HOSPITAL_COMMUNITY): Payer: Self-pay

## 2021-05-16 ENCOUNTER — Encounter: Payer: Self-pay | Admitting: Physician Assistant

## 2021-05-16 DIAGNOSIS — I255 Ischemic cardiomyopathy: Secondary | ICD-10-CM

## 2021-05-16 DIAGNOSIS — I2510 Atherosclerotic heart disease of native coronary artery without angina pectoris: Secondary | ICD-10-CM

## 2021-05-16 NOTE — Progress Notes (Signed)
There were no vitals taken for this visit.   Subjective:    Patient ID: [name], male    DOB: 1980/12/24, 40 y.o.   MRN: [id number]  HPI: [name] is a 40 y.o. male presenting on 05/16/2021 for No chief complaint on file.   HPI  This is a telemedicine visit through [employer] due to coronavirus pandemic.  I connected with  [name] on 05/16/21 by a video enabled telemedicine application and verified that I am speaking with the correct person using two identifiers.   I discussed the limitations of evaluation and management by telemedicine. The patient expressed understanding and agreed to proceed.  Pt is at home.  Provider is at office.    Pt had  RHC last week.  Discussing ICD with cardiology.  He is thinking about it.    He says he is not having any problems with getting his medications.  He does not report any issues other than his cardiac issues.   He says his mood is fine with no depression.     Relevant past medical, surgical, family and social history reviewed and updated as indicated. Interim medical history since our last visit reviewed. Allergies and medications reviewed and updated.   Current Outpatient Medications:    aspirin 81 MG EC tablet, Take 1 tablet (81 mg total) by mouth daily. Swallow whole., Disp: 90 tablet, Rfl: 0   carvedilol (COREG) 6.25 MG tablet, TAKE 1 Tablet  BY MOUTH TWICE DAILY, Disp: 180 tablet, Rfl: 0   clopidogrel (PLAVIX) 75 MG tablet, TAKE 1 Tablet BY MOUTH ONCE EVERY DAY, Disp: 90 tablet, Rfl: 0   dapagliflozin propanediol (FARXIGA) 10 MG TABS tablet, Take 1 tablet (10 mg total) by mouth daily before breakfast., Disp: 30 tablet, Rfl: 6   digoxin (LANOXIN) 0.125 MG tablet, TAKE 1 TABLET BY MOUTH ONCE DAILY., Disp: 34 tablet, Rfl: 2   ezetimibe (ZETIA) 10 MG tablet, TAKE 1 Tablet BY MOUTH ONCE EVERY DAY, Disp: 90 tablet, Rfl: 0   FLUoxetine (PROZAC) 10 MG capsule, Take 20 mg by mouth in the morning., Disp: , Rfl:    furosemide  (LASIX) 20 MG tablet, Take 2 tablets (40 mg total) by mouth 2 (two) times daily., Disp: 120 tablet, Rfl: 3   gabapentin (NEURONTIN) 300 MG capsule, Take 1 capsule (300 mg total) by mouth 3 (three) times daily., Disp: 90 capsule, Rfl: 0   MELATONIN GUMMIES PO, Take 1 tablet by mouth at bedtime as needed (sleep)., Disp: , Rfl:    potassium chloride SA (KLOR-CON) 20 MEQ tablet, Take 3 tablets (60 mEq total) by mouth daily., Disp: 60 tablet, Rfl: 3   rosuvastatin (CRESTOR) 40 MG tablet, Take 1 tablet (40 mg total) by mouth daily., Disp: 90 tablet, Rfl: 3   spironolactone (ALDACTONE) 25 MG tablet, Take 1 tablet (25 mg total) by mouth at bedtime., Disp: 30 tablet, Rfl: 2   traZODone (DESYREL) 50 MG tablet, TAKE 1/2 TO 1 Tablet BY MOUTH ONCE EVERY NIGHT AT BEDTIME AS NEEDED FOR SLEEP, Disp: 90 tablet, Rfl: 0 No current facility-administered medications for this visit.  Facility-Administered Medications Ordered in Other Visits:    sodium chloride flush (NS) 0.9 % injection 3 mL, 3 mL, Intravenous, Q12H, Laurey Morale, MD    Review of Systems  Per HPI unless specifically indicated above     Objective:    There were no vitals taken for this visit.  Wt Readings from Last 3 Encounters:  05/11/21  240 lb (108.9 kg)  05/03/21 241 lb 12.8 oz (109.7 kg)  04/03/21 242 lb 9.6 oz (110 kg)    Physical Exam Constitutional:      General: He is not in acute distress.    Appearance: He is not toxic-appearing.  HENT:     Head: Normocephalic and atraumatic.  Pulmonary:     Effort: No respiratory distress.     Comments: Pt is speaking in complete sentences without dyspnea Neurological:     Mental Status: He is alert and oriented to person, place, and time.  Psychiatric:        Attention and Perception: Attention normal.        Speech: Speech normal.        Behavior: Behavior is cooperative.          Assessment & Plan:    Encounter Diagnoses  Name Primary?   Atherosclerosis of native  coronary artery of native heart without angina pectoris Yes   Ischemic cardiomyopathy       Pt to continue with cardiology per their recommendations Pt to follow up here in January.  He is to contact office sooner prn

## 2021-05-17 ENCOUNTER — Other Ambulatory Visit (HOSPITAL_COMMUNITY): Payer: Self-pay

## 2021-05-17 ENCOUNTER — Other Ambulatory Visit: Payer: Self-pay | Admitting: Orthopaedic Surgery

## 2021-05-18 NOTE — Telephone Encounter (Signed)
Received a fax requesting medical records from Disability [employer] services. Records were successfully faxed to: [phone] ,which was the number provided.. Medical request form will be scanned into patients chart.

## 2021-05-25 ENCOUNTER — Other Ambulatory Visit (HOSPITAL_COMMUNITY): Payer: Self-pay

## 2021-05-29 ENCOUNTER — Other Ambulatory Visit (HOSPITAL_COMMUNITY): Payer: Self-pay

## 2021-05-30 ENCOUNTER — Telehealth: Payer: Self-pay | Admitting: Licensed Clinical Social Worker

## 2021-05-30 NOTE — Telephone Encounter (Signed)
LCSW has been following for CAFA processing.   Pt was approved for the following: CHARITY COMPLETE. PATIENT APPROVED FOR 50% FINANCIAL ASSISTANCE PER CAFA APPLICATION AND SUPPORTING DOCUMENTATION.  CAFA APP SCANNED TO ACCOUNT [id number] APPROVAL DATES OF [employer] ---- 04/07/21 - 10/07/21  APPROVAL LETTER ON ACCOUNT [id number]  I have added this as an active FYI on his chart too. I also shared this information with LCSW team lead Annice Pih and Ancil Boozer, RN at [facility] that had been following as well.   [name], MSW, LCSW [facility] Heart/Vascular Care Navigation  [phone]

## 2021-06-01 ENCOUNTER — Other Ambulatory Visit (HOSPITAL_COMMUNITY): Payer: Self-pay

## 2021-06-05 ENCOUNTER — Telehealth: Payer: Self-pay | Admitting: Licensed Clinical Social Worker

## 2021-06-05 NOTE — Telephone Encounter (Signed)
LCSW received a call from pt [phone]); he shares that he received the [facility] approval letter. He is interested in speaking with EP team to get procedure scheduled but concerned he may still have to pay for something up front. LCSW f/u with Ancil Boozer, RN to see who he should speak with to get this arranged. Boneta Lucks inquired if she could f/u with pt via myChart with a few options and to answer any additional questions. I called and spoke with pt and he is in agreement to check myChart for f/u. I encouraged him to call me if he does not hear from staff within a week.     Octavio Graves, MSW, LCSW [facility] Heart/Vascular Care Navigation  [phone]

## 2021-06-09 ENCOUNTER — Encounter (HOSPITAL_COMMUNITY): Payer: Self-pay | Admitting: Cardiology

## 2021-06-09 ENCOUNTER — Other Ambulatory Visit: Payer: Self-pay

## 2021-06-09 ENCOUNTER — Other Ambulatory Visit (HOSPITAL_COMMUNITY): Payer: Self-pay

## 2021-06-09 ENCOUNTER — Ambulatory Visit (HOSPITAL_COMMUNITY)
Admission: RE | Admit: 2021-06-09 | Discharge: 2021-06-09 | Disposition: A | Discharge status: Home / Self Care | Payer: Medicaid Other | Source: Ambulatory Visit | Attending: Cardiology | Admitting: Cardiology

## 2021-06-09 ENCOUNTER — Telehealth (HOSPITAL_COMMUNITY): Payer: Self-pay | Admitting: Pharmacy Technician

## 2021-06-09 VITALS — BP 104/70 | HR 61 | Wt 247.8 lb

## 2021-06-09 DIAGNOSIS — I340 Nonrheumatic mitral (valve) insufficiency: Secondary | ICD-10-CM | POA: Insufficient documentation

## 2021-06-09 DIAGNOSIS — Z951 Presence of aortocoronary bypass graft: Secondary | ICD-10-CM | POA: Insufficient documentation

## 2021-06-09 DIAGNOSIS — R5383 Other fatigue: Secondary | ICD-10-CM | POA: Insufficient documentation

## 2021-06-09 DIAGNOSIS — Z7984 Long term (current) use of oral hypoglycemic drugs: Secondary | ICD-10-CM | POA: Insufficient documentation

## 2021-06-09 DIAGNOSIS — Z7902 Long term (current) use of antithrombotics/antiplatelets: Secondary | ICD-10-CM | POA: Insufficient documentation

## 2021-06-09 DIAGNOSIS — Z87891 Personal history of nicotine dependence: Secondary | ICD-10-CM | POA: Insufficient documentation

## 2021-06-09 DIAGNOSIS — Z7982 Long term (current) use of aspirin: Secondary | ICD-10-CM | POA: Insufficient documentation

## 2021-06-09 DIAGNOSIS — Z955 Presence of coronary angioplasty implant and graft: Secondary | ICD-10-CM | POA: Insufficient documentation

## 2021-06-09 DIAGNOSIS — I255 Ischemic cardiomyopathy: Secondary | ICD-10-CM | POA: Insufficient documentation

## 2021-06-09 DIAGNOSIS — I252 Old myocardial infarction: Secondary | ICD-10-CM | POA: Insufficient documentation

## 2021-06-09 DIAGNOSIS — Z79899 Other long term (current) drug therapy: Secondary | ICD-10-CM | POA: Insufficient documentation

## 2021-06-09 DIAGNOSIS — I5022 Chronic systolic (congestive) heart failure: Secondary | ICD-10-CM | POA: Insufficient documentation

## 2021-06-09 DIAGNOSIS — I110 Hypertensive heart disease with heart failure: Secondary | ICD-10-CM | POA: Insufficient documentation

## 2021-06-09 DIAGNOSIS — I2510 Atherosclerotic heart disease of native coronary artery without angina pectoris: Secondary | ICD-10-CM | POA: Insufficient documentation

## 2021-06-09 DIAGNOSIS — R0600 Dyspnea, unspecified: Secondary | ICD-10-CM | POA: Insufficient documentation

## 2021-06-09 LAB — BASIC METABOLIC PANEL
Anion gap: 9 (ref 5–15)
BUN: 12 mg/dL (ref 6–20)
CO2: 23 mmol/L (ref 22–32)
Calcium: 8.7 mg/dL — ABNORMAL LOW (ref 8.9–10.3)
Chloride: 104 mmol/L (ref 98–111)
Creatinine, Ser: 0.89 mg/dL (ref 0.61–1.24)
GFR, Estimated: >60 mL/min (ref >60)
Glucose, Bld: 111 mg/dL — ABNORMAL HIGH (ref 70–99)
Potassium: 3.4 mmol/L — ABNORMAL LOW (ref 3.5–5.1)
Sodium: 136 mmol/L (ref 135–145)

## 2021-06-09 LAB — BRAIN NATRIURETIC PEPTIDE: B Natriuretic Peptide: 61.8 pg/mL (ref 0.0–100.0)

## 2021-06-09 MED ORDER — TORSEMIDE 20 MG PO TABS
40.0000 mg | ORAL_TABLET | Freq: Every day | ORAL | 3 refills | Status: DC
  Filled 2021-06-09: qty 60, 30d supply, fill #0
  Filled 2021-06-30: qty 60, 30d supply, fill #1
  Filled 2021-08-02: qty 60, 30d supply, fill #2
  Filled 2021-09-17: qty 60, 30d supply, fill #3

## 2021-06-09 MED ORDER — CARVEDILOL 6.25 MG PO TABS
9.3750 mg | ORAL_TABLET | Freq: Two times a day (BID) | ORAL | 3 refills | Status: DC
  Filled 2021-06-09: qty 90, 30d supply, fill #0

## 2021-06-09 MED ORDER — TORSEMIDE 40 MG PO TABS
40.0000 mg | ORAL_TABLET | Freq: Every day | ORAL | 3 refills | Status: DC
  Filled 2021-06-09: qty 90, fill #0

## 2021-06-09 MED ORDER — CARVEDILOL 6.25 MG PO TABS
9.3750 mg | ORAL_TABLET | Freq: Two times a day (BID) | ORAL | 3 refills | Status: DC
  Filled 2021-06-09: qty 90, 30d supply, fill #0
  Filled 2021-08-02: qty 90, 30d supply, fill #1
  Filled 2021-09-17: qty 90, 30d supply, fill #2
  Filled 2021-10-10: qty 90, 30d supply, fill #3
  Filled 2021-12-12: qty 90, 30d supply, fill #4
  Filled 2022-02-07: qty 90, 30d supply, fill #5

## 2021-06-09 NOTE — Telephone Encounter (Signed)
Advanced Heart Failure Patient Advocate Encounter  Sent in Medicaid denial via fax to [employer].  Will follow up.  Provided patient 2 bottles of Farxiga samples, the phone lines to [employer] are down until 08/30.  LOT [id number] EXP 09/14/2023

## 2021-06-09 NOTE — Patient Instructions (Addendum)
Labs done today. We will contact you only if your labs are abnormal.  STOP taking Lasix  START Torsemide 40mg  (1 tablet) by mouth daily.  INCREASE Carvedilol to 9.375mg  (1 & 1/2 tablets) by mouth 2 times daily.   No other medication changes were made. Please continue all current medications as prescribed.  You have been referred to Cardiac electrophysiology at [facility]. They will contact you to schedule an appointment.   Your physician recommends that you schedule a follow-up appointment in: 10 days for a lab only appointment(in [place]), 3 weeks with our Clinic Pharmacist and in 6 weeks with our APP Clinic here in our office.   If you have any questions or concerns before your next appointment please send [facility] a message through [place] or call our office at [phone].    TO LEAVE A MESSAGE FOR THE NURSE SELECT OPTION 2, PLEASE LEAVE A MESSAGE INCLUDING: YOUR NAME DATE OF BIRTH CALL BACK NUMBER REASON FOR CALL**this is important as we prioritize the call backs  YOU WILL RECEIVE A CALL BACK THE SAME DAY AS LONG AS YOU CALL BEFORE 4:00 PM   Do the following things EVERYDAY: Weigh yourself in the morning before breakfast. Write it down and keep it in a log. Take your medicines as prescribed Eat low salt foods--Limit salt (sodium) to 2000 mg per day.  Stay as active as you can everyday Limit all fluids for the day to less than 2 liters   At the Advanced Heart Failure Clinic, you and your health needs are our priority. As part of our continuing mission to provide you with exceptional heart care, we have created designated Provider Care Teams. These Care Teams include your primary Cardiologist (physician) and Advanced Practice Providers (APPs- Physician Assistants and Nurse Practitioners) who all work together to provide you with the care you need, when you need it.   You may see any of the following providers on your designated Care Team at your next follow up: Dr [phone] Dr Arvilla Meres, NP Carron Curie, Robbie Lis [place], PharmD   Please be sure to bring in all your medications bottles to every appointment.

## 2021-06-09 NOTE — Progress Notes (Signed)
CSW referred to assist patient with some questions regarding CAFA. Patient reports he received a letter stating that he has been approved for 50% CAFA. Patient is unclear why as he has had no income for the past year. Patient also in need of a defibrillator and asking if he will have up front cost associated with the procedure. CSW contacted Christia Reading @ [phone] to follow up on patient's questions although message left for return call. Patient provided main number for financial counseling to follow up with questions and CSW will return call after hearing back from [facility]. Patient grateful for the assistance and will follow up. Lasandra Beech, LCSW, CCSW-MCS [phone]
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
## 2021-06-14 ENCOUNTER — Telehealth: Payer: Self-pay

## 2021-06-14 DIAGNOSIS — I255 Ischemic cardiomyopathy: Secondary | ICD-10-CM

## 2021-06-14 DIAGNOSIS — I5022 Chronic systolic (congestive) heart failure: Secondary | ICD-10-CM

## 2021-06-15 ENCOUNTER — Other Ambulatory Visit: Payer: Self-pay | Admitting: Orthopedic Surgery

## 2021-06-20 ENCOUNTER — Other Ambulatory Visit: Payer: Self-pay

## 2021-06-20 ENCOUNTER — Other Ambulatory Visit: Payer: Self-pay | Admitting: *Deleted

## 2021-06-20 DIAGNOSIS — I255 Ischemic cardiomyopathy: Secondary | ICD-10-CM

## 2021-06-20 DIAGNOSIS — I5022 Chronic systolic (congestive) heart failure: Secondary | ICD-10-CM

## 2021-06-20 LAB — CBC WITH DIFFERENTIAL/PLATELET
Basophils Absolute: 0.1 10*3/uL (ref 0.0–0.2)
Basos: 1 %
EOS (ABSOLUTE): 0.4 10*3/uL (ref 0.0–0.4)
Eos: 5 %
Hematocrit: 41.8 % (ref 37.5–51.0)
Hemoglobin: 14.3 g/dL (ref 13.0–17.7)
Lymphocytes Absolute: 2.1 10*3/uL (ref 0.7–3.1)
Lymphs: 25 %
MCH: 29.9 pg (ref 26.6–33.0)
MCHC: 34.2 g/dL (ref 31.5–35.7)
MCV: 87 fL (ref 79–97)
Monocytes Absolute: 0.8 10*3/uL (ref 0.1–0.9)
Monocytes: 9 %
Neutrophils Absolute: 5.1 10*3/uL (ref 1.4–7.0)
Neutrophils: 60 %
Platelets: 321 10*3/uL (ref 150–450)
RBC: 4.78 x10E6/uL (ref 4.14–5.80)
RDW: 15.6 % — ABNORMAL HIGH (ref 11.6–15.4)
WBC: 8.5 10*3/uL (ref 3.4–10.8)

## 2021-06-20 LAB — BASIC METABOLIC PANEL
BUN/Creatinine Ratio: 23 — ABNORMAL HIGH (ref 9–20)
BUN: 27 mg/dL — ABNORMAL HIGH (ref 6–24)
CO2: 24 mmol/L (ref 20–29)
Calcium: 8.8 mg/dL (ref 8.7–10.2)
Chloride: 103 mmol/L (ref 96–106)
Creatinine, Ser: 1.16 mg/dL (ref 0.76–1.27)
Glucose: 100 mg/dL — ABNORMAL HIGH (ref 65–99)
Potassium: 4.6 mmol/L (ref 3.5–5.2)
Sodium: 134 mmol/L (ref 134–144)
eGFR: 82 mL/min/{1.73_m2} (ref >59)

## 2021-06-21 NOTE — Telephone Encounter (Signed)
Work up complete for ICD implant

## 2021-06-23 ENCOUNTER — Ambulatory Visit (HOSPITAL_COMMUNITY)
Admission: RE | Admit: 2021-06-23 | Discharge: 2021-06-23 | Disposition: A | Discharge status: Home / Self Care | Payer: Self-pay | Attending: Internal Medicine | Admitting: Internal Medicine

## 2021-06-23 ENCOUNTER — Ambulatory Visit (HOSPITAL_COMMUNITY)
Admission: RE | Disposition: A | Discharge status: Home / Self Care | Payer: Self-pay | Source: Home / Self Care | Attending: Internal Medicine

## 2021-06-23 ENCOUNTER — Ambulatory Visit (HOSPITAL_COMMUNITY): Payer: Self-pay

## 2021-06-23 ENCOUNTER — Other Ambulatory Visit: Payer: Self-pay

## 2021-06-23 DIAGNOSIS — Z8249 Family history of ischemic heart disease and other diseases of the circulatory system: Secondary | ICD-10-CM | POA: Insufficient documentation

## 2021-06-23 DIAGNOSIS — Z880 Allergy status to penicillin: Secondary | ICD-10-CM | POA: Insufficient documentation

## 2021-06-23 DIAGNOSIS — Z7982 Long term (current) use of aspirin: Secondary | ICD-10-CM | POA: Insufficient documentation

## 2021-06-23 DIAGNOSIS — I252 Old myocardial infarction: Secondary | ICD-10-CM | POA: Insufficient documentation

## 2021-06-23 DIAGNOSIS — Z79899 Other long term (current) drug therapy: Secondary | ICD-10-CM | POA: Insufficient documentation

## 2021-06-23 DIAGNOSIS — I255 Ischemic cardiomyopathy: Secondary | ICD-10-CM | POA: Insufficient documentation

## 2021-06-23 DIAGNOSIS — Z95810 Presence of automatic (implantable) cardiac defibrillator: Secondary | ICD-10-CM

## 2021-06-23 DIAGNOSIS — Z7902 Long term (current) use of antithrombotics/antiplatelets: Secondary | ICD-10-CM | POA: Insufficient documentation

## 2021-06-23 DIAGNOSIS — Z955 Presence of coronary angioplasty implant and graft: Secondary | ICD-10-CM | POA: Insufficient documentation

## 2021-06-23 DIAGNOSIS — I2510 Atherosclerotic heart disease of native coronary artery without angina pectoris: Secondary | ICD-10-CM | POA: Insufficient documentation

## 2021-06-23 DIAGNOSIS — I5022 Chronic systolic (congestive) heart failure: Secondary | ICD-10-CM | POA: Insufficient documentation

## 2021-06-23 DIAGNOSIS — Z87891 Personal history of nicotine dependence: Secondary | ICD-10-CM | POA: Insufficient documentation

## 2021-06-23 DIAGNOSIS — E669 Obesity, unspecified: Secondary | ICD-10-CM | POA: Insufficient documentation

## 2021-06-23 DIAGNOSIS — I110 Hypertensive heart disease with heart failure: Secondary | ICD-10-CM | POA: Insufficient documentation

## 2021-06-23 DIAGNOSIS — I472 Ventricular tachycardia: Secondary | ICD-10-CM

## 2021-06-23 DIAGNOSIS — Z6836 Body mass index (BMI) 36.0-36.9, adult: Secondary | ICD-10-CM | POA: Insufficient documentation

## 2021-06-23 HISTORY — PX: ICD IMPLANT: EP1208

## 2021-06-23 SURGERY — ICD IMPLANT

## 2021-06-23 MED ORDER — ACETAMINOPHEN 325 MG PO TABS
325.0000 mg | ORAL_TABLET | ORAL | Status: DC | PRN
  Administered 2021-06-23: 650 mg via ORAL
  Filled 2021-06-23: qty 2

## 2021-06-23 MED ORDER — HEPARIN (PORCINE) IN NACL 1000-0.9 UT/500ML-% IV SOLN
INTRAVENOUS | Status: DC | PRN
  Administered 2021-06-23: 500 mL

## 2021-06-23 MED ORDER — LIDOCAINE HCL 1 % IJ SOLN
INTRAMUSCULAR | Status: AC
  Filled 2021-06-23: qty 60

## 2021-06-23 MED ORDER — VANCOMYCIN HCL IN DEXTROSE 1-5 GM/200ML-% IV SOLN
1000.0000 mg | INTRAVENOUS | Status: AC
  Administered 2021-06-23: 1000 mg via INTRAVENOUS
  Filled 2021-06-23: qty 200

## 2021-06-23 MED ORDER — LIDOCAINE HCL (PF) 1 % IJ SOLN
INTRAMUSCULAR | Status: DC | PRN
  Administered 2021-06-23: 60 mL

## 2021-06-23 MED ORDER — MIDAZOLAM HCL 5 MG/5ML IJ SOLN
INTRAMUSCULAR | Status: DC | PRN
  Administered 2021-06-23: 1 mg via INTRAVENOUS
  Administered 2021-06-23 (×2): 2 mg via INTRAVENOUS

## 2021-06-23 MED ORDER — SODIUM CHLORIDE 0.9 % IV SOLN
INTRAVENOUS | Status: AC
  Filled 2021-06-23: qty 2

## 2021-06-23 MED ORDER — SODIUM CHLORIDE 0.9 % IV SOLN: INTRAVENOUS | Status: DC

## 2021-06-23 MED ORDER — FENTANYL CITRATE (PF) 100 MCG/2ML IJ SOLN
INTRAMUSCULAR | Status: DC | PRN
  Administered 2021-06-23 (×2): 25 ug via INTRAVENOUS

## 2021-06-23 MED ORDER — FENTANYL CITRATE (PF) 100 MCG/2ML IJ SOLN
INTRAMUSCULAR | Status: AC
  Filled 2021-06-23: qty 2

## 2021-06-23 MED ORDER — VANCOMYCIN HCL 500 MG/100ML IV SOLN
500.0000 mg | INTRAVENOUS | Status: AC
  Administered 2021-06-23: 500 mg via INTRAVENOUS
  Filled 2021-06-23: qty 100

## 2021-06-23 MED ORDER — MIDAZOLAM HCL 5 MG/5ML IJ SOLN
INTRAMUSCULAR | Status: AC
  Filled 2021-06-23: qty 5

## 2021-06-23 MED ORDER — POVIDONE-IODINE 10 % EX SWAB
2.0000 | Freq: Once | CUTANEOUS | Status: DC
Start: 2021-06-23 — End: 2021-06-23

## 2021-06-23 MED ORDER — CHLORHEXIDINE GLUCONATE 4 % EX LIQD: 4.0000 "application " | Freq: Once | CUTANEOUS | Status: DC

## 2021-06-23 MED ORDER — HEPARIN (PORCINE) IN NACL 1000-0.9 UT/500ML-% IV SOLN
INTRAVENOUS | Status: AC
  Filled 2021-06-23: qty 500

## 2021-06-23 MED ORDER — ONDANSETRON HCL 4 MG/2ML IJ SOLN: 4.0000 mg | Freq: Four times a day (QID) | INTRAMUSCULAR | Status: DC | PRN

## 2021-06-23 MED ORDER — VANCOMYCIN HCL 1500 MG/300ML IV SOLN: 1500.0000 mg | INTRAVENOUS | Status: DC

## 2021-06-23 MED ORDER — VANCOMYCIN HCL IN DEXTROSE 1-5 GM/200ML-% IV SOLN
INTRAVENOUS | Status: AC
  Filled 2021-06-23: qty 200

## 2021-06-23 MED ORDER — SODIUM CHLORIDE 0.9 % IV SOLN
80.0000 mg | INTRAVENOUS | Status: AC
  Administered 2021-06-23: 80 mg

## 2021-06-23 SURGICAL SUPPLY — 8 items
CABLE SURGICAL S-101-97-12 (CABLE) ×2 IMPLANT
ICD MOMENTUM D120 (ICD Generator) ×1 IMPLANT
LEAD RELIANCE 0138-64 (Lead) ×1 IMPLANT
PAD PRO RADIOLUCENT 2001M-C (PAD) ×2 IMPLANT
SHEATH 7FR PRELUDE SNAP 13 (SHEATH) ×1 IMPLANT
SHEATH 8FR PRELUDE SNAP 13 (SHEATH) ×1 IMPLANT
SHEATH 9FR PRELUDE SNAP 13 (SHEATH) ×1 IMPLANT
TRAY PACEMAKER INSERTION (PACKS) ×2 IMPLANT

## 2021-06-23 NOTE — Discharge Instructions (Signed)
After Your ICD (Implantable Cardiac Defibrillator)   You have a Environmental manager ICD  ACTIVITY Do not lift your arm above shoulder height for 1 week after your procedure. After 7 days, you may progress as below.  You should remove your sling 24 hours after your procedure, unless otherwise instructed by your provider.     Friday June 30, 2021  Saturday July 01, 2021 Sunday July 02, 2021 Monday July 03, 2021   Do not lift, push, pull, or carry anything over 10 pounds with the affected arm until 6 weeks (Friday August 04, 2021 ) after your procedure.   You may drive AFTER your wound check, unless you have been told otherwise by your provider.   Ask your healthcare provider when you can go back to work   INCISION/Dressing If you are on a blood thinner such as Coumadin, Xarelto, Eliquis, Plavix, or Pradaxa please confirm with your provider when this should be resumed.   If large square, outer bandage is left in place, this can be removed after 24 hours from your procedure. Do not remove steri-strips or glue as below.   Monitor your defibrillator site for redness, swelling, and drainage. Call the device clinic at [phone] if you experience these symptoms or fever/chills.  If your incision is sealed with Steri-strips or staples, you may shower 10 days after your procedure or when told by your provider. Do not remove the steri-strips or let the shower hit directly on your site. You may wash around your site with soap and water.    Avoid lotions, ointments, or perfumes over your incision until it is well-healed.  You may use a hot tub or a pool AFTER your wound check appointment if the incision is completely closed.  Your ICD is designed to protect you from life threatening heart rhythms. Because of this, you may receive a shock.   1 shock with no symptoms:  Call the office during business hours. 1 shock with symptoms (chest pain, chest pressure, dizziness,  lightheadedness, shortness of breath, overall feeling unwell):  Call 911. If you experience 2 or more shocks in 24 hours:  Call 911. If you receive a shock, you should not drive for 6 months per the [place] DMV IF you receive appropriate therapy from your ICD.   ICD Alerts:  Some alerts are vibratory and others beep. These are NOT emergencies. Please call our office to let us know. If this occurs at night or on weekends, it can wait until the next business day. Send a remote transmission.  If your device is capable of reading fluid status (for heart failure), you will be offered monthly monitoring to review this with you.   DEVICE MANAGEMENT Remote monitoring is used to monitor your ICD from home. This monitoring is scheduled every 91 days by our office. It allows [place] to keep an eye on the functioning of your device to ensure it is working properly. You will routinely see your Electrophysiologist annually (more often if necessary).   You should receive your ID card for your new device in 4-8 weeks. Keep this card with you at all times once received. Consider wearing a medical alert bracelet or necklace.  Your ICD  may be MRI compatible. This will be discussed at your next office visit/wound check.  You should avoid contact with strong electric or magnetic fields.   Do not use amateur (ham) radio equipment or electric (arc) welding torches. MP3 player headphones with magnets should not be used. Some  devices are safe to use if held at least 12 inches (30 cm) from your defibrillator. These include power tools, lawn mowers, and speakers. If you are unsure if something is safe to use, ask your health care provider.  When using your cell phone, hold it to the ear that is on the opposite side from the defibrillator. Do not leave your cell phone in a pocket over the defibrillator.  You may safely use electric blankets, heating pads, computers, and microwave ovens.  Call the office right away if: You have  chest pain. You feel more than one shock. You feel more short of breath than you have felt before. You feel more light-headed than you have felt before. Your incision starts to open up.  This information is not intended to replace advice given to you by your health care provider. Make sure you discuss any questions you have with your health care provider.

## 2021-06-23 NOTE — H&P (Signed)
HPI [name] is referred today by Dr. Jens Som and Shirlee Latch for consideration for ICD insertion. He is a pleasant 40 yo man with obesity, who has an ICM, s/p MI, s/p CABG. He has an EF of 30% and class 2 CHF. He has not had syncope. He is on GDMT and denies non-compliance. The patient denies anginal symptoms. He has not been able to lose weight.      Allergies  Allergen Reactions   Penicillins Anaphylaxis, Swelling and Rash      "Rash from head to toe, throat closed up" with amoxicillin at 40 years old   PCN reaction causing immediate rash, facial/tongue/throat swelling, SOB or lightheadedness with hypotension: Yes Has patient had a PCN reaction causing severe rash involving mucus membranes or skin necrosis: Yes Has patient had a PCN reaction that required hospitalization: No Has patient had a PCN reaction occurring within the last 10 years: No If all of the above answers are "NO", then may proceed with Cephalosporin use.   Penicillins        anaphylactic              Current Outpatient Medications  Medication Sig Dispense Refill   aspirin 81 MG EC tablet Take 1 tablet (81 mg total) by mouth daily. Swallow whole. 90 tablet 0   carvedilol (COREG) 6.25 MG tablet Take 1 tablet (6.25 mg total) by mouth 2 (two) times daily. 180 tablet 0   clopidogrel (PLAVIX) 75 MG tablet Take 1 tablet (75 mg total) by mouth daily. 90 tablet 0   dapagliflozin propanediol (FARXIGA) 10 MG TABS tablet Take 1 tablet (10 mg total) by mouth daily before breakfast. 30 tablet 6   digoxin (LANOXIN) 0.125 MG tablet TAKE 1 TABLET BY MOUTH ONCE DAILY. 34 tablet 2   ezetimibe (ZETIA) 10 MG tablet Take 1 tablet (10 mg total) by mouth daily. 90 tablet 0   FLUoxetine (PROZAC) 20 MG tablet Take 20 mg by mouth daily.       furosemide (LASIX) 40 MG tablet Take 1 tablet (40 mg total) by mouth daily. 30 tablet 2   gabapentin (NEURONTIN) 300 MG capsule TAKE 1 CAPSULE BY MOUTH THREE TIMES A DAY 90 capsule 0   potassium  chloride SA (KLOR-CON) 20 MEQ tablet Take 40 mEq by mouth daily.       rosuvastatin (CRESTOR) 40 MG tablet Take 1 tablet (40 mg total) by mouth daily. 90 tablet 3   sacubitril-valsartan (ENTRESTO) 24-26 MG TAKE 1 TABLET BY MOUTH 2 (TWO) TIMES DAILY. 60 tablet 3   spironolactone (ALDACTONE) 25 MG tablet Take 1 tablet (25 mg total) by mouth at bedtime. 30 tablet 2   traZODone (DESYREL) 50 MG tablet Take 0.5-1 tablets (25-50 mg total) by mouth at bedtime as needed for sleep. 90 tablet 0    No current facility-administered medications for this visit.            Past Medical History:  Diagnosis Date   CAD (coronary artery disease)      inferiot MI; LHC with EF 55% and basal to mid inferior hypokinesis. totally occluded RCA w/weak L-R collaterals. 50% ostial OM1. long 70% proximal LAD. Pt had promus DES to LAD and RCA. echo 9/11: EF 55%, no regional wall motion abnormalities    CHF (congestive heart failure) (HCC)     GI bleed 9/11    secondary to mallory-weiss tear with endoscopic clipping    Hyperlipidemia     Hypertension  Nephrolithiasis     Transfusion history 2011/2012   Traumatic subdural hematoma (HCC) 8/11    with secondary tonic clonic seizure (9/11)      ROS:    All systems reviewed and negative except as noted in the HPI.          Past Surgical History:  Procedure Laterality Date   CARDIAC SURGERY       CORONARY ARTERY BYPASS GRAFT N/A 06/24/2020    Procedure: CORONARY ARTERY BYPASS GRAFTING (CABG) x 6, bilateral IMA, LIMA TO LAD, RIMA TO PDA, LEFT RADIAL ARTERY TO DIAG. 1 SEQ TO OM1, SVG TO OM2, SVG TO ACUTE MARGINAL;  Surgeon: Linden Dolin, MD;  Location: [facility] OR;  Service: Open Heart Surgery;  Laterality: N/A;   ENDOVEIN HARVEST OF GREATER SAPHENOUS VEIN Right 06/24/2020    Procedure: ENDOVEIN HARVEST OF GREATER SAPHENOUS VEIN;  Surgeon: Linden Dolin, MD;  Location: [facility] OR;  Service: Open Heart Surgery;  Laterality: Right;   IABP INSERTION N/A 06/23/2020     Procedure: IABP Insertion;  Surgeon: Yvonne Kendall, MD;  Location: [facility] INVASIVE CV LAB;  Service: Cardiovascular;  Laterality: N/A;   LEFT HEART CATH AND CORONARY ANGIOGRAPHY N/A 06/23/2020    Procedure: LEFT HEART CATH AND CORONARY ANGIOGRAPHY;  Surgeon: Yvonne Kendall, MD;  Location: [facility] INVASIVE CV LAB;  Service: Cardiovascular;  Laterality: N/A;   patients states had two stent surgery to his heart   03/30/2010   RADIAL ARTERY HARVEST Left 06/24/2020    Procedure: RADIAL ARTERY HARVEST;  Surgeon: Linden Dolin, MD;  Location: [facility] OR;  Service: Open Heart Surgery;  Laterality: Left;   TEE WITHOUT CARDIOVERSION N/A 06/24/2020    Procedure: TRANSESOPHAGEAL ECHOCARDIOGRAM (TEE);  Surgeon: Linden Dolin, MD;  Location: [facility] OR;  Service: Open Heart Surgery;  Laterality: N/A;             Family History  Problem Relation Age of Onset   Heart disease Father     Coronary artery disease Other          noncontributory for early CAD and MI   Rheum arthritis Mother     Diabetes Mother     Hyperlipidemia Mother     Hypertension Mother     Stroke Other          Social History         Socioeconomic History   Marital status: Single      Spouse name: Not on file   Number of children: 2   Years of education: Not on file   Highest education level: Not on file  Occupational History   Not on file  Tobacco Use   Smoking status: Former Smoker      Packs/day: 1.50   Smokeless tobacco: Never Used   Tobacco comment: quit september 2021  Vaping Use   Vaping Use: Never used  Substance and Sexual Activity   Alcohol use: Yes      Comment: occasional    Drug use: No   Sexual activity: Not on file  Other Topics Concern   Not on file  Social History Narrative    Married; lives in [place].    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[employer])    Social Determinants of Health       Financial Resource Strain: High Risk   Difficulty of Paying Living Expenses: Hard  Food Insecurity: No Food  Insecurity   Worried About Running Out of Food in the Last Year: Never true  Ran Out of Food in the Last Year: Never true  Transportation Needs: No Transportation Needs   Lack of Transportation (Medical): No   Lack of Transportation (Non-Medical): No  Physical Activity: Not on file  Stress: Not on file  Social Connections: Not on file  Intimate Partner Violence: Not on file        BP 100/64   Pulse 79   Ht 5\' 9"  (1.753 m)   Wt 243 lb 13.9 oz (110.6 kg)   SpO2 95%   BMI 36.01 kg/m    Physical Exam:   Well appearing NAD HEENT: Unremarkable Neck:  No JVD, no thyromegally Lymphatics:  No adenopathy Back:  No CVA tenderness Lungs:  Clear with no wheezes HEART:  Regular rate rhythm, no murmurs, no rubs, no clicks Abd:  soft, positive bowel sounds, no organomegally, no rebound, no guarding Ext:  2 plus pulses, no edema, no cyanosis, no clubbing Skin:  No rashes no nodules Neuro:  CN II through XII intact, motor grossly intact   EKG - nsr    Assess/Plan: 1. ICM - he is s/p MI and has severe LV dysfunction and class 2 CHF symptoms. He is on maximal GDMT. I have discussed the indications/risks/benefits/goals/expectations of ICD Insertion and he would like to proceed. 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  3. HTN - his bp is well controlled.  4. Obesity - he is encouraged to lose weight.     EP Attending  Patient seen and examined. Agree with above. No change since his last visit. For ICD insertion.   Dorathy Daft Paije Goodhart,MD

## 2021-06-23 NOTE — Progress Notes (Signed)
Pt ambulated without difficulty or bleeding.   Discharged home with his sister who will drive and stay with pt x 24 hrs.

## 2021-06-26 ENCOUNTER — Telehealth: Payer: Self-pay

## 2021-06-26 ENCOUNTER — Encounter (HOSPITAL_COMMUNITY): Payer: Self-pay | Admitting: Internal Medicine

## 2021-06-26 MED FILL — Lidocaine HCl Local Inj 1%: INTRAMUSCULAR | Qty: 60 | Status: AC

## 2021-06-26 MED FILL — Heparin Sod (Porcine)-NaCl IV Soln 1000 Unit/500ML-0.9%: INTRAVENOUS | Qty: 500 | Status: AC

## 2021-06-26 MED FILL — Lidocaine HCl Local Inj 1%: INTRAMUSCULAR | Qty: 40 | Status: AC

## 2021-06-26 MED FILL — Gentamicin Sulfate Inj 40 MG/ML: INTRAMUSCULAR | Qty: 80 | Status: AC

## 2021-06-26 NOTE — Telephone Encounter (Signed)
Follow-up after same day discharge: Implant date: 06/23/21 MD: Lewayne Bunting, MD Device: BSX ICD   Location: Left Chest   Wound check visit: 07/05/21 90 day MD follow-up: 09/26/21  Remote Transmission received:Yes   Dressing removed: yes   Reviewed wound care including keeping steri-strip dry and intact until wound check.  Also reviewed activity restrictions as outlined in AVS.    Pt. indicated that there is some soreness that is currently;y being managed with tylenol.

## 2021-06-26 NOTE — Telephone Encounter (Signed)
-----   Message from Sheilah Pigeon, [place] sent at 06/23/2021  4:37 PM EDT ----- Same day d/c  BSci PPM  GT

## 2021-06-29 NOTE — Telephone Encounter (Signed)
Advanced Heart Failure Patient Advocate Encounter  Called [employer] to check the status of the patient's application. Representative stated that she escalated the application and confirmed that the Medicaid denial was received. No timeline on when approval may occur.  Will follow up.

## 2021-06-30 ENCOUNTER — Other Ambulatory Visit (HOSPITAL_COMMUNITY): Payer: Self-pay

## 2021-07-03 ENCOUNTER — Ambulatory Visit (HOSPITAL_COMMUNITY)
Admission: RE | Admit: 2021-07-03 | Discharge: 2021-07-03 | Disposition: A | Discharge status: Home / Self Care | Payer: Self-pay | Source: Ambulatory Visit | Attending: Internal Medicine | Admitting: Internal Medicine

## 2021-07-03 ENCOUNTER — Other Ambulatory Visit: Payer: Self-pay

## 2021-07-03 DIAGNOSIS — Z7982 Long term (current) use of aspirin: Secondary | ICD-10-CM | POA: Insufficient documentation

## 2021-07-03 DIAGNOSIS — Z7902 Long term (current) use of antithrombotics/antiplatelets: Secondary | ICD-10-CM | POA: Insufficient documentation

## 2021-07-03 DIAGNOSIS — Z951 Presence of aortocoronary bypass graft: Secondary | ICD-10-CM | POA: Insufficient documentation

## 2021-07-03 DIAGNOSIS — Z955 Presence of coronary angioplasty implant and graft: Secondary | ICD-10-CM | POA: Insufficient documentation

## 2021-07-03 DIAGNOSIS — Z8679 Personal history of other diseases of the circulatory system: Secondary | ICD-10-CM | POA: Insufficient documentation

## 2021-07-03 DIAGNOSIS — I255 Ischemic cardiomyopathy: Secondary | ICD-10-CM | POA: Insufficient documentation

## 2021-07-03 DIAGNOSIS — I5022 Chronic systolic (congestive) heart failure: Secondary | ICD-10-CM | POA: Insufficient documentation

## 2021-07-03 DIAGNOSIS — Z79899 Other long term (current) drug therapy: Secondary | ICD-10-CM | POA: Insufficient documentation

## 2021-07-03 DIAGNOSIS — I2510 Atherosclerotic heart disease of native coronary artery without angina pectoris: Secondary | ICD-10-CM | POA: Insufficient documentation

## 2021-07-03 DIAGNOSIS — I252 Old myocardial infarction: Secondary | ICD-10-CM | POA: Insufficient documentation

## 2021-07-03 NOTE — Progress Notes (Signed)
ReDS Vest / Clip - 07/03/21 1400       ReDS Vest / Clip   Station Marker D    Ruler Value 37.5    ReDS Value Range Moderate volume overload    ReDS Actual Value 39
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
## 2021-07-03 NOTE — Patient Instructions (Signed)
It was a pleasure seeing you today!  MEDICATIONS: -We are changing your medications today -Take torsemide 40 mg (2 tabs) every morning and 20 mg (1 tab) every evening x3 days. Then resume torsemide 40 mg (2 tabs) daily.  -Call if you have questions about your medications.   NEXT APPOINTMENT: Return to clinic in 2 weeks with APP Clinic.  In general, to take care of your heart failure: -Limit your fluid intake to 2 Liters (half-gallon) per day.   -Limit your salt intake to ideally 2-3 grams (2000-3000 mg) per day. -Weigh yourself daily and record, and bring that "weight diary" to your next appointment.  (Weight gain of 2-3 pounds in 1 day typically means fluid weight.) -The medications for your heart are to help your heart and help you live longer.   -Please contact us before stopping any of your heart medications.  Call the clinic at [phone] with questions or to reschedule future appointments.

## 2021-07-05 ENCOUNTER — Ambulatory Visit (INDEPENDENT_AMBULATORY_CARE_PROVIDER_SITE_OTHER): Payer: Self-pay

## 2021-07-05 ENCOUNTER — Other Ambulatory Visit: Payer: Self-pay

## 2021-07-05 ENCOUNTER — Other Ambulatory Visit (HOSPITAL_COMMUNITY): Payer: Self-pay

## 2021-07-05 ENCOUNTER — Encounter: Payer: Medicaid Other | Admitting: Internal Medicine

## 2021-07-05 DIAGNOSIS — I255 Ischemic cardiomyopathy: Secondary | ICD-10-CM

## 2021-07-05 LAB — CUP PACEART INCLINIC DEVICE CHECK
Brady Statistic RV Percent Paced: 1 % — CL
Date Time Interrogation Session: 20220921162658
HighPow Impedance: 74 Ohm
Implantable Lead Implant Date: 20220909
Implantable Lead Location: 753860
Implantable Lead Model: 137
Implantable Lead Serial Number: 301248
Implantable Pulse Generator Implant Date: 20220909
Lead Channel Impedance Value: 572 Ohm
Lead Channel Pacing Threshold Amplitude: 1.1 V
Lead Channel Pacing Threshold Pulse Width: 0.4 ms
Lead Channel Sensing Intrinsic Amplitude: 25 mV
Lead Channel Setting Pacing Amplitude: 3.5 V
Lead Channel Setting Pacing Pulse Width: 0.4 ms
Lead Channel Setting Sensing Sensitivity: 0.6 mV
Pulse Gen Serial Number: 213174

## 2021-07-05 NOTE — Patient Instructions (Signed)
   After Your ICD (Implantable Cardiac Defibrillator)    Monitor your defibrillator site for redness, swelling, and drainage. Call the device clinic at [phone] if you experience these symptoms or fever/chills.  Your incision was closed with Steri-strips or staples:  You may shower 7 days after your procedure and wash your incision with soap and water. Avoid lotions, ointments, or perfumes over your incision until it is well-healed.    You may use a hot tub or a pool after your wound check appointment if the incision is completely closed.  Do not lift, push or pull greater than 10 pounds with the affected arm until 6 weeks after your procedure. There are no other restrictions in arm movement after your wound check appointment.  Your ICD is MRI compatible.  Your ICD is designed to protect you from life threatening heart rhythms. Because of this, you may receive a shock.   1 shock with no symptoms:  Call the office during business hours. 1 shock with symptoms (chest pain, chest pressure, dizziness, lightheadedness, shortness of breath, overall feeling unwell):  Call 911. If you experience 2 or more shocks in 24 hours:  Call 911. If you receive a shock, you should not drive.  [place] DMV - no driving for 6 months if you receive appropriate therapy from your ICD.   ICD Alerts:  Some alerts are vibratory and others beep. These are NOT emergencies. Please call our office to let us know. If this occurs at night or on weekends, it can wait until the next business day. Send a remote transmission.  If your device is capable of reading fluid status (for heart failure), you will be offered monthly monitoring to review this with you.   Remote monitoring is used to monitor your ICD from home. This monitoring is scheduled every 91 days by our office. It allows us to keep an eye on the functioning of your device to ensure it is working properly. You will routinely see your Electrophysiologist annually  (more often if necessary).   

## 2021-07-05 NOTE — Progress Notes (Signed)
Wound check appointment. Steri-strips removed. Wound without redness or edema. Incision edges approximated, wound well healed. Normal device function. Thresholds, sensing, and impedances consistent with implant measurements. Device programmed at 3.5V for extra safety margin until 3 month visit. Histogram distribution appropriate for patient and level of activity. No mode switches or ventricular arrhythmias noted. Patient educated about wound care, arm mobility, lifting restrictions, shock plan. Patient is enrolled in remote monitoring, next scheduled check 09/26/21.  ROV with Dr. Ladona Ridgel on 10/02/21.

## 2021-07-08 NOTE — H&P (Signed)
The patient's chart has been reviewed and they meet criteria for ICD implant.  I have had a thorough discussion with the patient reviewing options.  The patient and their family (if available) have had opportunities to ask questions and have them answered. The patient and I have decided together through the [facility] Heart Care Share Decision Support Tool to implant ICD at this time.  Risks, benefits, alternatives to ICD implantation were discussed in detail with the patient today. The patient  understands that the risks include but are not limited to bleeding, infection, pneumothorax, perforation, tamponade, vascular damage, renal failure, MI, stroke, death, inappropriate shocks, and lead dislodgement and  wishes to proceed.   Sharlot Gowda Zaharah Amir,MD

## 2021-07-12 ENCOUNTER — Other Ambulatory Visit: Payer: Self-pay | Admitting: Orthopedic Surgery

## 2021-07-12 ENCOUNTER — Encounter: Payer: Self-pay | Admitting: Physician Assistant

## 2021-07-13 NOTE — Telephone Encounter (Signed)
Advanced Heart Failure Patient Advocate Encounter  Called [employer] to check the status of the patient's application. Representative stated that the case was still under review. They are backed up and no timeline could be given.
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
## 2021-07-19 ENCOUNTER — Other Ambulatory Visit: Payer: Self-pay

## 2021-07-19 ENCOUNTER — Ambulatory Visit (HOSPITAL_COMMUNITY)
Admission: RE | Admit: 2021-07-19 | Discharge: 2021-07-19 | Disposition: A | Discharge status: Home / Self Care | Payer: Self-pay | Source: Ambulatory Visit | Attending: Family Medicine | Admitting: Family Medicine

## 2021-07-19 ENCOUNTER — Telehealth (HOSPITAL_COMMUNITY): Payer: Self-pay | Admitting: Family Medicine

## 2021-07-19 ENCOUNTER — Encounter (HOSPITAL_COMMUNITY): Payer: Self-pay

## 2021-07-19 VITALS — BP 100/80 | HR 64 | Wt 247.0 lb

## 2021-07-19 DIAGNOSIS — I110 Hypertensive heart disease with heart failure: Secondary | ICD-10-CM | POA: Insufficient documentation

## 2021-07-19 DIAGNOSIS — I2510 Atherosclerotic heart disease of native coronary artery without angina pectoris: Secondary | ICD-10-CM | POA: Insufficient documentation

## 2021-07-19 DIAGNOSIS — Z95810 Presence of automatic (implantable) cardiac defibrillator: Secondary | ICD-10-CM | POA: Insufficient documentation

## 2021-07-19 DIAGNOSIS — Z955 Presence of coronary angioplasty implant and graft: Secondary | ICD-10-CM | POA: Insufficient documentation

## 2021-07-19 DIAGNOSIS — I252 Old myocardial infarction: Secondary | ICD-10-CM | POA: Insufficient documentation

## 2021-07-19 DIAGNOSIS — I5022 Chronic systolic (congestive) heart failure: Secondary | ICD-10-CM | POA: Insufficient documentation

## 2021-07-19 DIAGNOSIS — Z7984 Long term (current) use of oral hypoglycemic drugs: Secondary | ICD-10-CM | POA: Insufficient documentation

## 2021-07-19 DIAGNOSIS — Z951 Presence of aortocoronary bypass graft: Secondary | ICD-10-CM | POA: Insufficient documentation

## 2021-07-19 DIAGNOSIS — Z7982 Long term (current) use of aspirin: Secondary | ICD-10-CM | POA: Insufficient documentation

## 2021-07-19 DIAGNOSIS — Z7902 Long term (current) use of antithrombotics/antiplatelets: Secondary | ICD-10-CM | POA: Insufficient documentation

## 2021-07-19 DIAGNOSIS — Z87891 Personal history of nicotine dependence: Secondary | ICD-10-CM | POA: Insufficient documentation

## 2021-07-19 DIAGNOSIS — G4733 Obstructive sleep apnea (adult) (pediatric): Secondary | ICD-10-CM | POA: Insufficient documentation

## 2021-07-19 LAB — BASIC METABOLIC PANEL
Anion gap: 11 (ref 5–15)
BUN: 19 mg/dL (ref 6–20)
CO2: 25 mmol/L (ref 22–32)
Calcium: 9 mg/dL (ref 8.9–10.3)
Chloride: 99 mmol/L (ref 98–111)
Creatinine, Ser: 1.21 mg/dL (ref 0.61–1.24)
GFR, Estimated: >60 mL/min (ref >60)
Glucose, Bld: 84 mg/dL (ref 70–99)
Potassium: 3.5 mmol/L (ref 3.5–5.1)
Sodium: 135 mmol/L (ref 135–145)

## 2021-07-19 LAB — DIGOXIN LEVEL: Digoxin Level: <0.2 ng/mL — ABNORMAL LOW (ref 0.8–2.0)

## 2021-07-19 NOTE — Telephone Encounter (Signed)
Called patient and told his it was OK to stop his ASA and remain on his Plavix, per discussion with Dr. Shirlee Latch. He verbalized understanding. Medication list updated in chart.  Colin Rome, FNP-BC

## 2021-07-19 NOTE — Patient Instructions (Signed)
Labs done today, your results will be available in MyChart, we will contact you for abnormal readings.  Your physician has recommended that you have a cardiopulmonary stress test (CPX). CPX testing is a non-invasive measurement of heart and lung function. It replaces a traditional treadmill stress test. This type of test provides a tremendous amount of information that relates not only to your present condition but also for future outcomes. This test combines measurements of you ventilation, respiratory gas exchange in the lungs, electrocardiogram (EKG), blood pressure and physical response before, during, and following an exercise protocol.  Your physician recommends that you schedule a follow-up appointment in: 3-4 months  Do the following things EVERYDAY: Weigh yourself in the morning before breakfast. Write it down and keep it in a log. Take your medicines as prescribed Eat low salt foods--Limit salt (sodium) to 2000 mg per day.  Stay as active as you can everyday Limit all fluids for the day to less than 2 liters  If you have any questions or concerns before your next appointment please send [place] a message through [place] or call our office at [phone].    TO LEAVE A MESSAGE FOR THE NURSE SELECT OPTION 2, PLEASE LEAVE A MESSAGE INCLUDING: YOUR NAME DATE OF BIRTH CALL BACK NUMBER REASON FOR CALL**this is important as we prioritize the call backs  YOU WILL RECEIVE A CALL BACK THE SAME DAY AS LONG AS YOU CALL BEFORE 4:00 PM  At the [facility], you and your health needs are our priority. As part of our continuing mission to provide you with exceptional heart care, we have created designated Provider Care Teams. These Care Teams include your primary Cardiologist (physician) and Advanced Practice Providers (APPs- Physician Assistants and Nurse Practitioners) who all work together to provide you with the care you need, when you need it.   You may see any of the following  providers on your designated Care Team at your next follow up: Dr Arvilla Meres Dr Marca Ancona Dr Brandon Melnick, NP Robbie Lis, [place] Mikki Santee Karle Plumber, PharmD   Please be sure to bring in all your medications bottles to every appointment.

## 2021-07-28 ENCOUNTER — Other Ambulatory Visit (HOSPITAL_COMMUNITY): Payer: Self-pay

## 2021-07-31 ENCOUNTER — Other Ambulatory Visit: Payer: Self-pay

## 2021-07-31 ENCOUNTER — Ambulatory Visit (HOSPITAL_COMMUNITY): Payer: Medicaid Other | Attending: Cardiology

## 2021-07-31 DIAGNOSIS — I5022 Chronic systolic (congestive) heart failure: Secondary | ICD-10-CM | POA: Insufficient documentation

## 2021-07-31 NOTE — Telephone Encounter (Signed)
Advanced Heart Failure Patient Advocate Encounter   Patient was approved to receive Farxiga from [employer]  Patient ID: [id number] Effective dates: 07/28/21 through 07/28/22  Archer Asa, CPhT

## 2021-08-01 ENCOUNTER — Other Ambulatory Visit: Payer: Self-pay | Admitting: Physician Assistant

## 2021-08-01 ENCOUNTER — Telehealth: Payer: Self-pay

## 2021-08-01 DIAGNOSIS — I255 Ischemic cardiomyopathy: Secondary | ICD-10-CM

## 2021-08-01 DIAGNOSIS — E785 Hyperlipidemia, unspecified: Secondary | ICD-10-CM

## 2021-08-01 MED ORDER — TRAZODONE HCL 50 MG PO TABS: ORAL_TABLET | ORAL | 0 refills | Status: DC

## 2021-08-01 MED ORDER — EZETIMIBE 10 MG PO TABS: ORAL_TABLET | ORAL | 0 refills | Status: DC

## 2021-08-01 MED ORDER — CLOPIDOGREL BISULFATE 75 MG PO TABS: ORAL_TABLET | ORAL | 0 refills | Status: DC

## 2021-08-01 NOTE — Telephone Encounter (Signed)
Call from pt requesting alternative Rx sent for Prozac due to medication interactions with spironolactone. Advised pt that due to [place] not being the prescriber we cannot alter medication Rx.

## 2021-08-02 ENCOUNTER — Other Ambulatory Visit (HOSPITAL_COMMUNITY): Payer: Self-pay | Admitting: Cardiology

## 2021-08-02 ENCOUNTER — Other Ambulatory Visit (HOSPITAL_COMMUNITY): Payer: Self-pay

## 2021-08-03 ENCOUNTER — Other Ambulatory Visit (HOSPITAL_COMMUNITY): Payer: Self-pay

## 2021-08-03 MED ORDER — SPIRONOLACTONE 25 MG PO TABS
25.0000 mg | ORAL_TABLET | Freq: Every day | ORAL | 2 refills | Status: DC
  Filled 2021-08-03: qty 30, 30d supply, fill #0
  Filled 2021-09-11: qty 30, 30d supply, fill #1
  Filled 2021-10-10: qty 30, 30d supply, fill #2

## 2021-08-03 MED ORDER — DIGOXIN 125 MCG PO TABS
0.1250 mg | ORAL_TABLET | Freq: Every day | ORAL | 2 refills | Status: DC
  Filled 2021-08-03: qty 34, 34d supply, fill #0
  Filled 2021-09-11: qty 34, 34d supply, fill #1
  Filled 2021-10-10: qty 34, 34d supply, fill #2

## 2021-08-08 ENCOUNTER — Other Ambulatory Visit (HOSPITAL_COMMUNITY): Payer: Self-pay

## 2021-08-09 ENCOUNTER — Telehealth (HOSPITAL_COMMUNITY): Payer: Self-pay | Admitting: Family Medicine

## 2021-08-09 NOTE — Telephone Encounter (Signed)
Called patient to discuss medication changes and results of CPX. Mailbox full and unable to leave message.

## 2021-08-13 ENCOUNTER — Other Ambulatory Visit: Payer: Self-pay | Admitting: Orthopedic Surgery

## 2021-08-21 ENCOUNTER — Other Ambulatory Visit: Payer: Self-pay

## 2021-08-21 ENCOUNTER — Other Ambulatory Visit (HOSPITAL_COMMUNITY): Payer: Self-pay | Admitting: Cardiology

## 2021-08-21 ENCOUNTER — Ambulatory Visit (HOSPITAL_COMMUNITY)
Admission: RE | Admit: 2021-08-21 | Discharge: 2021-08-21 | Disposition: A | Discharge status: Home / Self Care | Payer: Medicaid Other | Source: Ambulatory Visit | Attending: Internal Medicine | Admitting: Internal Medicine

## 2021-08-21 ENCOUNTER — Ambulatory Visit (HOSPITAL_COMMUNITY)
Admission: RE | Admit: 2021-08-21 | Discharge: 2021-08-21 | Disposition: A | Discharge status: Home / Self Care | Payer: Medicaid Other | Source: Ambulatory Visit | Attending: Cardiology | Admitting: Cardiology

## 2021-08-21 DIAGNOSIS — I493 Ventricular premature depolarization: Secondary | ICD-10-CM

## 2021-08-21 NOTE — Progress Notes (Signed)
Your provider has recommended that  you wear a Zio Patch for 14 days.  This monitor will record your heart rhythm for our review.  IF you have any symptoms while wearing the monitor please press the button.  If you have any issues with the patch or you notice a red or orange light on it please call the company at [phone].  Once you remove the patch please mail it back to the company as soon as possible so we can get the results.  

## 2021-08-21 NOTE — Progress Notes (Signed)
Patient seen as a nurse visit. Zio applied

## 2021-08-24 ENCOUNTER — Telehealth (HOSPITAL_COMMUNITY): Payer: Self-pay | Admitting: *Deleted

## 2021-08-24 NOTE — Telephone Encounter (Signed)
Pts sister called to report that pt has no energy and his bp is low. Laying down bp 67/30 sitting 87/49 pt denies dizziness or lightheadedness. Per Dr.McLean hold entresto and torsemide x 1 day and then decrease entresto to 49/51 daily. Call office if bp remains low or pt becomes symptomatic. Sister and pt aware and agreeable with plan.

## 2021-09-10 ENCOUNTER — Other Ambulatory Visit: Payer: Self-pay | Admitting: Orthopedic Surgery

## 2021-09-11 ENCOUNTER — Other Ambulatory Visit (HOSPITAL_COMMUNITY): Payer: Self-pay

## 2021-09-15 NOTE — Addendum Note (Signed)
Encounter addended by: Crissie Figures, RN on: 09/15/2021 10:40 AM  Actions taken: Imaging Exam ended

## 2021-09-18 ENCOUNTER — Other Ambulatory Visit (HOSPITAL_COMMUNITY): Payer: Self-pay | Admitting: *Deleted

## 2021-09-18 ENCOUNTER — Other Ambulatory Visit (HOSPITAL_COMMUNITY): Payer: Self-pay

## 2021-09-18 MED ORDER — ENTRESTO 49-51 MG PO TABS: 1.0000 | ORAL_TABLET | Freq: Two times a day (BID) | ORAL | 11 refills | Status: DC

## 2021-09-26 ENCOUNTER — Encounter: Payer: Self-pay | Admitting: Internal Medicine

## 2021-09-26 ENCOUNTER — Other Ambulatory Visit: Payer: Self-pay

## 2021-09-26 ENCOUNTER — Ambulatory Visit (INDEPENDENT_AMBULATORY_CARE_PROVIDER_SITE_OTHER): Payer: Self-pay | Admitting: Internal Medicine

## 2021-09-26 VITALS — BP 112/64 | HR 65 | Ht 69.0 in | Wt 238.8 lb

## 2021-09-26 DIAGNOSIS — Z95810 Presence of automatic (implantable) cardiac defibrillator: Secondary | ICD-10-CM

## 2021-09-26 DIAGNOSIS — I255 Ischemic cardiomyopathy: Secondary | ICD-10-CM

## 2021-09-26 MED ORDER — TORSEMIDE 20 MG PO TABS
40.0000 mg | ORAL_TABLET | Freq: Every day | ORAL | 3 refills | Status: DC
Start: 2021-09-26 — End: 2021-11-22

## 2021-09-26 NOTE — Patient Instructions (Signed)
Medication Instructions:  Your physician recommends that you continue on your current medications as directed. Please refer to the Current Medication list given to you today.  Labwork: None ordered.  Testing/Procedures: None ordered.  Follow-Up: Your physician wants you to follow-up in: one year with Lewayne Bunting, MD or one of the following Advanced Practice Providers on your designated Care Team:   Francis Dowse, [place] Casimiro Needle "Mardelle Matte" Lanna Poche, [place]  Remote monitoring is used to monitor your ICD from home. This monitoring reduces the number of office visits required to check your device to one time per year. It allows [place] to keep an eye on the functioning of your device to ensure it is working properly. You are scheduled for a device check from home on 10/02/2021. You may send your transmission at any time that day. If you have a wireless device, the transmission will be sent automatically. After your physician reviews your transmission, you will receive a postcard with your next transmission date.  Any Other Special Instructions Will Be Listed Below (If Applicable).  If you need a refill on your cardiac medications before your next appointment, please call your pharmacy.

## 2021-09-26 NOTE — Progress Notes (Signed)
HPI [name] returns today for followup. He is a pleasant 40 yo man with a h/o ICM, s/p anterior MI, s/p CABG. He is s/p ICD insertion. He denies chest pain or sob. He is trying to do better from a dietary perspective. No syncope or ICD therapies.  Allergies  Allergen Reactions   Penicillins Anaphylaxis, Swelling and Rash    "Rash from head to toe, throat closed up" with amoxicillin at 40 years old  PCN reaction causing immediate rash, facial/tongue/throat swelling, SOB or lightheadedness with hypotension: Yes Has patient had a PCN reaction causing severe rash involving mucus membranes or skin necrosis: Yes Has patient had a PCN reaction that required hospitalization: No Has patient had a PCN reaction occurring within the last 10 years: No If all of the above answers are "NO", then may proceed with Cephalosporin use.     Current Outpatient Medications  Medication Sig Dispense Refill   aspirin 81 MG EC tablet Take 1 tablet (81 mg total) by mouth daily. Swallow whole. (Patient not taking: Reported on 09/26/2021) 90 tablet 0   carvedilol (COREG) 6.25 MG tablet Take 1.5 tablets (9.375 mg total) by mouth 2 (two) times daily. 270 tablet 3   clopidogrel (PLAVIX) 75 MG tablet TAKE 1 Tablet BY MOUTH ONCE EVERY DAY 90 tablet 0   dapagliflozin propanediol (FARXIGA) 10 MG TABS tablet Take 1 tablet (10 mg total) by mouth daily before breakfast. 30 tablet 6   digoxin (LANOXIN) 0.125 MG tablet Take 1 tablet (0.125 mg total) by mouth daily. 34 tablet 2   ezetimibe (ZETIA) 10 MG tablet TAKE 1 Tablet BY MOUTH ONCE EVERY DAY 90 tablet 0   FLUoxetine (PROZAC) 10 MG capsule Take 20 mg by mouth in the morning. (Patient not taking: Reported on 09/26/2021)     gabapentin (NEURONTIN) 300 MG capsule TAKE 1 CAPSULE BY MOUTH THREE TIMES A DAY 90 capsule 0   MELATONIN GUMMIES PO Take 1-2 tablets by mouth at bedtime as needed (sleep).     potassium chloride SA (KLOR-CON) 20 MEQ tablet Take 20 mEq by mouth 2  (two) times daily.     rosuvastatin (CRESTOR) 40 MG tablet Take 1 tablet (40 mg total) by mouth daily. 90 tablet 3   sacubitril-valsartan (ENTRESTO) 49-51 MG Take 1 tablet by mouth 2 (two) times daily. 60 tablet 11   spironolactone (ALDACTONE) 25 MG tablet Take 1 tablet (25 mg total) by mouth at bedtime. 30 tablet 2   torsemide (DEMADEX) 20 MG tablet Take 2 tablets (40 mg total) by mouth daily. 180 tablet 3   traZODone (DESYREL) 50 MG tablet TAKE 1/2 TO 1 Tablet BY MOUTH ONCE EVERY NIGHT AT BEDTIME AS NEEDED FOR SLEEP 90 tablet 0   Zinc 50 MG TABS Take 50 mg by mouth in the morning.     No current facility-administered medications for this visit.   Facility-Administered Medications Ordered in Other Visits  Medication Dose Route Frequency Provider Last Rate Last Admin   sodium chloride flush (NS) 0.9 % injection 3 mL  3 mL Intravenous Q12H Larey Dresser, MD         Past Medical History:  Diagnosis Date   CAD (coronary artery disease)    inferiot MI; LHC with EF 55% and basal to mid inferior hypokinesis. totally occluded RCA w/weak L-R collaterals. 50% ostial OM1. long 70% proximal LAD. Pt had promus DES to LAD and RCA. 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   Past Surgical History:  Procedure Laterality Date   CARDIAC SURGERY     CORONARY ARTERY BYPASS GRAFT N/A 06/24/2020   Procedure: CORONARY ARTERY BYPASS GRAFTING (CABG) x 6, bilateral IMA, LIMA TO LAD, RIMA TO PDA, LEFT RADIAL ARTERY TO DIAG. 1 SEQ TO OM1, SVG TO OM2, SVG TO ACUTE MARGINAL;  Surgeon: Wonda Olds, MD;  Location: [place];  Service: Open  Heart Surgery;  Laterality: N/A;   ENDOVEIN HARVEST OF GREATER SAPHENOUS VEIN Right 06/24/2020   Procedure: ENDOVEIN HARVEST OF GREATER SAPHENOUS VEIN;  Surgeon: Wonda Olds, MD;  Location: [place];  Service: Open Heart Surgery;  Laterality: Right;   IABP INSERTION N/A 06/23/2020   Procedure: IABP Insertion;  Surgeon: Nelva Bush, MD;  Location: [place] [facility];  Service: Cardiovascular;  Laterality: N/A;   ICD IMPLANT N/A 06/23/2021   Procedure: ICD IMPLANT;  Surgeon: Evans Lance, MD;  Location: [place] [facility];  Service: Cardiovascular;  Laterality: N/A;   LEFT HEART CATH AND CORONARY ANGIOGRAPHY N/A 06/23/2020   Procedure: LEFT HEART CATH AND CORONARY ANGIOGRAPHY;  Surgeon: Nelva Bush, MD;  Location: [place] [facility];  Service: Cardiovascular;  Laterality: N/A;   patients states had two stent surgery to his heart  03/30/2010   RADIAL ARTERY HARVEST Left 06/24/2020   Procedure: RADIAL ARTERY HARVEST;  Surgeon: Wonda Olds, MD;  Location: [place];  Service: Open Heart Surgery;  Laterality: Left;   RIGHT HEART CATH N/A 05/11/2021   Procedure: RIGHT HEART CATH;  Surgeon: Larey Dresser, MD;  Location: [place] [facility];  Service: Cardiovascular;  Laterality: N/A;   TEE WITHOUT CARDIOVERSION N/A 06/24/2020   Procedure: TRANSESOPHAGEAL ECHOCARDIOGRAM (TEE);  Surgeon: Wonda Olds, MD;  Location: [place];  Service: Open Heart Surgery;  Laterality: N/A;     Family History  Problem Relation Age of Onset   Rheum arthritis Mother    Diabetes Mother    Hyperlipidemia Mother    Hypertension Mother    Heart disease Father    Heart attack Other        6 way bypass   Stroke Other    Coronary artery disease Other        noncontributory for early CAD and MI   Stroke Other      Social History   Socioeconomic History   Marital status: Single    Spouse name: Not on file   Number of children: 2   Years of education: Not on file   Highest education level: Not on file   Occupational History   Not on file  Tobacco Use   Smoking status: Former    Packs/day: 1.50    Types: Cigarettes   Smokeless tobacco: Never   Tobacco comments:    quit september 2021  Vaping Use   Vaping Use: Never used  Substance and Sexual Activity   Alcohol use: Yes    Comment: occasional    Drug use: No   Sexual activity: Not on file  Other Topics Concern   Not on file  Social History  Narrative   Married; lives in [place].   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[employer])   Social Determinants of Health   Financial Resource Strain: High Risk   Difficulty of Paying Living Expenses: Hard  Food Insecurity: No Food Insecurity   Worried About Programme researcher, broadcasting/film/video in the Last Year: Never true   Ran Out of Food in the Last Year: Never true  Transportation Needs: No Transportation Needs   Lack of Transportation (Medical): No   Lack of Transportation (Non-Medical): No  Physical Activity: Not on file  Stress: Not on file  Social Connections: Not on file  Intimate Partner Violence: Not on file     BP 112/64    Pulse 65    Ht 5\' 9"  (1.753 m)    Wt 238 lb 12.8 oz (108.3 kg)    SpO2 97%    BMI 35.26 kg/m   Physical Exam:  Well appearing NAD HEENT: Unremarkable Neck:  No JVD, no thyromegally Lymphatics:  No adenopathy Back:  No CVA tenderness Lungs:  Clear with no wheezes HEART:  Regular rate rhythm, no murmurs, no rubs, no clicks Abd:  soft, positive bowel sounds, no organomegally, no rebound, no guarding Ext:  2 plus pulses, no edema, no cyanosis, no clubbing Skin:  No rashes no nodules Neuro:  CN II through XII intact, motor grossly intact  EKG - nsr with old anterior MI  Assess/Plan:  ICM - he denies anginal symptoms. I encouraged him to increase his physical activity. Chronic systolic heart failure - his symptoms are class 2. I encouraged him to walk every day and discussed the importance of daily activity and weight loss. ICD - his [employer] ICD is working  normally. We will recheck in several months.  Colin burnie Jaedin Trumbo,MD

## 2021-09-29 LAB — CUP PACEART INCLINIC DEVICE CHECK
Date Time Interrogation Session: 20221213000000
HighPow Impedance: 81 Ohm
Implantable Lead Implant Date: 20220909
Implantable Lead Location: 753860
Implantable Lead Model: 137
Implantable Lead Serial Number: 301248
Implantable Pulse Generator Implant Date: 20220909
Lead Channel Impedance Value: 740 Ohm
Lead Channel Pacing Threshold Amplitude: 0.8 V
Lead Channel Pacing Threshold Pulse Width: 0.4 ms
Lead Channel Sensing Intrinsic Amplitude: 25 mV
Lead Channel Setting Pacing Amplitude: 3.5 V
Lead Channel Setting Pacing Pulse Width: 0.4 ms
Lead Channel Setting Sensing Sensitivity: 0.6 mV
Pulse Gen Serial Number: 213174

## 2021-10-02 ENCOUNTER — Ambulatory Visit (INDEPENDENT_AMBULATORY_CARE_PROVIDER_SITE_OTHER): Payer: Self-pay

## 2021-10-02 DIAGNOSIS — I255 Ischemic cardiomyopathy: Secondary | ICD-10-CM

## 2021-10-03 LAB — CUP PACEART REMOTE DEVICE CHECK
Battery Remaining Longevity: 168 mo
Battery Remaining Percentage: 100 %
Brady Statistic RV Percent Paced: 0 %
Date Time Interrogation Session: 20221219001400
HighPow Impedance: 83 Ohm
Implantable Lead Implant Date: 20220909
Implantable Lead Location: 753860
Implantable Lead Model: 137
Implantable Lead Serial Number: 301248
Implantable Pulse Generator Implant Date: 20220909
Lead Channel Impedance Value: 696 Ohm
Lead Channel Pacing Threshold Amplitude: 0.8 V
Lead Channel Pacing Threshold Pulse Width: 0.4 ms
Lead Channel Setting Pacing Amplitude: 3.5 V
Lead Channel Setting Pacing Pulse Width: 0.4 ms
Lead Channel Setting Sensing Sensitivity: 0.6 mV
Pulse Gen Serial Number: 213174

## 2021-10-09 ENCOUNTER — Other Ambulatory Visit: Payer: Self-pay | Admitting: Orthopedic Surgery

## 2021-10-10 ENCOUNTER — Other Ambulatory Visit (HOSPITAL_COMMUNITY): Payer: Self-pay

## 2021-10-11 ENCOUNTER — Other Ambulatory Visit: Payer: Self-pay | Admitting: Physician Assistant

## 2021-10-11 DIAGNOSIS — I255 Ischemic cardiomyopathy: Secondary | ICD-10-CM

## 2021-10-11 DIAGNOSIS — E785 Hyperlipidemia, unspecified: Secondary | ICD-10-CM

## 2021-10-11 DIAGNOSIS — I2510 Atherosclerotic heart disease of native coronary artery without angina pectoris: Secondary | ICD-10-CM

## 2021-10-11 NOTE — Progress Notes (Signed)
Remote ICD transmission.   

## 2021-10-24 ENCOUNTER — Other Ambulatory Visit: Payer: Self-pay | Admitting: Physician Assistant

## 2021-10-24 DIAGNOSIS — E785 Hyperlipidemia, unspecified: Secondary | ICD-10-CM

## 2021-10-27 ENCOUNTER — Other Ambulatory Visit (HOSPITAL_COMMUNITY)
Admission: RE | Admit: 2021-10-27 | Discharge: 2021-10-27 | Disposition: A | Discharge status: Home / Self Care | Payer: Medicaid Other | Source: Ambulatory Visit | Attending: Physician Assistant | Admitting: Physician Assistant

## 2021-10-27 ENCOUNTER — Other Ambulatory Visit: Payer: Self-pay

## 2021-10-27 DIAGNOSIS — I2510 Atherosclerotic heart disease of native coronary artery without angina pectoris: Secondary | ICD-10-CM | POA: Insufficient documentation

## 2021-10-27 DIAGNOSIS — E785 Hyperlipidemia, unspecified: Secondary | ICD-10-CM | POA: Insufficient documentation

## 2021-10-27 DIAGNOSIS — I255 Ischemic cardiomyopathy: Secondary | ICD-10-CM | POA: Insufficient documentation

## 2021-10-27 LAB — COMPREHENSIVE METABOLIC PANEL
ALT: 49 U/L — ABNORMAL HIGH (ref 0–44)
AST: 32 U/L (ref 15–41)
Albumin: 3.8 g/dL (ref 3.5–5.0)
Alkaline Phosphatase: 63 U/L (ref 38–126)
Anion gap: 7 (ref 5–15)
BUN: 11 mg/dL (ref 6–20)
CO2: 24 mmol/L (ref 22–32)
Calcium: 9 mg/dL (ref 8.9–10.3)
Chloride: 105 mmol/L (ref 98–111)
Creatinine, Ser: 0.92 mg/dL (ref 0.61–1.24)
GFR, Estimated: >60 mL/min (ref >60)
Glucose, Bld: 113 mg/dL — ABNORMAL HIGH (ref 70–99)
Potassium: 3.4 mmol/L — ABNORMAL LOW (ref 3.5–5.1)
Sodium: 136 mmol/L (ref 135–145)
Total Bilirubin: 0.4 mg/dL (ref 0.3–1.2)
Total Protein: 7.1 g/dL (ref 6.5–8.1)

## 2021-10-27 LAB — LIPID PANEL
Cholesterol: 121 mg/dL (ref 0–200)
HDL: 44 mg/dL (ref >40)
LDL Cholesterol: 34 mg/dL (ref 0–99)
Total CHOL/HDL Ratio: 2.8 RATIO
Triglycerides: 215 mg/dL — ABNORMAL HIGH (ref <150)
VLDL: 43 mg/dL — ABNORMAL HIGH (ref 0–40)

## 2021-10-30 ENCOUNTER — Encounter: Payer: Self-pay | Admitting: Physician Assistant

## 2021-10-30 ENCOUNTER — Ambulatory Visit: Payer: Medicaid Other | Admitting: Physician Assistant

## 2021-10-30 DIAGNOSIS — I255 Ischemic cardiomyopathy: Secondary | ICD-10-CM

## 2021-10-30 DIAGNOSIS — G4700 Insomnia, unspecified: Secondary | ICD-10-CM

## 2021-10-30 DIAGNOSIS — Z5989 Other problems related to housing and economic circumstances: Secondary | ICD-10-CM

## 2021-10-30 DIAGNOSIS — F419 Anxiety disorder, unspecified: Secondary | ICD-10-CM

## 2021-10-30 DIAGNOSIS — E669 Obesity, unspecified: Secondary | ICD-10-CM

## 2021-10-30 DIAGNOSIS — Z951 Presence of aortocoronary bypass graft: Secondary | ICD-10-CM

## 2021-10-30 MED ORDER — TRAZODONE HCL 50 MG PO TABS: ORAL_TABLET | ORAL | 0 refills | Status: DC

## 2021-10-30 MED ORDER — FLUOXETINE HCL 10 MG PO TABS: 10.0000 mg | ORAL_TABLET | Freq: Every day | ORAL | 0 refills | Status: DC

## 2021-10-30 NOTE — Progress Notes (Signed)
There were no vitals taken for this visit.   Subjective:    Patient ID: [name], male    DOB: Dec 29, 1980, 41 y.o.   MRN: [id number]  HPI: [name] is a 41 y.o. male presenting on 10/30/2021 for No chief complaint on file.   HPI  This is a telemedicine appointment through [employer].  I connected with  [name] on 10/30/21 by a video enabled telemedicine application and verified that I am speaking with the correct person using two identifiers.   I discussed the limitations of evaluation and management by telemedicine. The patient expressed understanding and agreed to proceed.  Pt is at home.  Provider is at office.    Pt ran out of his prozac.  He says he Feels "edgey" off his prozac.  He is also feeling very frustrated with his situation of his HF and can't get disability or medicaid.  He has Not gotten covid vaccination yet  Pt applied for and got denied medicaid last year and disability.  He says he has talked with attorney.  Pt admits to frustration over his meds.  Rx have been sent to several places and he has run out of some and doesn't know where they're all supposed to be coming from.    Pt has no new complaints today.   Pt sees cardiology regularly; he is s/p CABG x 6, has implanted cardiac defibrillator and he has ischemic cardiomyopathy with EF 20-25% by echo (09/2020).        Relevant past medical, surgical, family and social history reviewed and updated as indicated. Interim medical history since our last visit reviewed. Allergies and medications reviewed and updated.   Current Outpatient Medications:    aspirin 81 MG EC tablet, Take 1 tablet (81 mg total) by mouth daily. Swallow whole., Disp: 90 tablet, Rfl: 0   carvedilol (COREG) 6.25 MG tablet, Take 1.5 tablets (9.375 mg total) by mouth 2 (two) times daily., Disp: 270 tablet, Rfl: 3   clopidogrel (PLAVIX) 75 MG tablet, TAKE 1 Tablet BY MOUTH ONCE DAILY, Disp: 90 tablet, Rfl: 0    dapagliflozin propanediol (FARXIGA) 10 MG TABS tablet, Take 1 tablet (10 mg total) by mouth daily before breakfast., Disp: 30 tablet, Rfl: 6   digoxin (LANOXIN) 0.125 MG tablet, Take 1 tablet (0.125 mg total) by mouth daily., Disp: 34 tablet, Rfl: 2   ezetimibe (ZETIA) 10 MG tablet, TAKE 1 Tablet BY MOUTH ONCE DAILY, Disp: 90 tablet, Rfl: 0   gabapentin (NEURONTIN) 300 MG capsule, TAKE 1 CAPSULE BY MOUTH THREE TIMES A DAY, Disp: 90 capsule, Rfl: 0   potassium chloride SA (KLOR-CON) 20 MEQ tablet, Take 20 mEq by mouth 2 (two) times daily., Disp: , Rfl:    rosuvastatin (CRESTOR) 40 MG tablet, Take 1 tablet (40 mg total) by mouth daily., Disp: 90 tablet, Rfl: 3   spironolactone (ALDACTONE) 25 MG tablet, Take 1 tablet (25 mg total) by mouth at bedtime., Disp: 30 tablet, Rfl: 2   traZODone (DESYREL) 50 MG tablet, TAKE 1/2 TO 1 Tablet BY MOUTH ONCE EVERY NIGHT AT BEDTIME AS NEEDED FOR sleep, Disp: 90 tablet, Rfl: 0   Zinc 50 MG TABS, Take 50 mg by mouth in the morning., Disp: , Rfl:    MELATONIN GUMMIES PO, Take 1-2 tablets by mouth at bedtime as needed (sleep). (Patient not taking: Reported on 10/30/2021), Disp: , Rfl:    sacubitril-valsartan (ENTRESTO) 49-51 MG, Take 1 tablet by mouth 2 (two) times daily. (  Patient not taking: Reported on 10/30/2021), Disp: 60 tablet, Rfl: 11   torsemide (DEMADEX) 20 MG tablet, Take 2 tablets (40 mg total) by mouth daily. (Patient not taking: Reported on 10/30/2021), Disp: 180 tablet, Rfl: 3 No current facility-administered medications for this visit.  Facility-Administered Medications Ordered in Other Visits:    sodium chloride flush (NS) 0.9 % injection 3 mL, 3 mL, Intravenous, Q12H, Laurey Morale, MD    Review of Systems  Per HPI unless specifically indicated above     Objective:    There were no vitals taken for this visit.  Wt Readings from Last 3 Encounters:  09/26/21 238 lb 12.8 oz (108.3 kg)  07/19/21 247 lb (112 kg)  06/23/21 240 lb (108.9 kg)     Physical Exam Constitutional:      General: He is not in acute distress.    Appearance: He is obese. He is not toxic-appearing.  HENT:     Head: Normocephalic and atraumatic.  Pulmonary:     Effort: No respiratory distress.     Comments: Pt is talking in complete sentences without dyspnea. Neurological:     Mental Status: He is alert and oriented to person, place, and time.  Psychiatric:        Attention and Perception: Attention normal.        Speech: Speech normal.        Behavior: Behavior normal.          Assessment & Plan:    Encounter Diagnoses  Name Primary?   Ischemic cardiomyopathy Yes   S/P CABG x 6    Anxiety    Insomnia, unspecified type    Uninsured    Obesity, unspecified classification, unspecified obesity type, unspecified whether serious comorbidity present        -rx Trazodone and prozac to [facility]  -pt educated and encouraged to Get Covid vaccination  -spent long time with pt going over his meds and checking availability with [facility]ist.     [facility]SI[facility]: Trazodone Carvedilol Clopidogrel Zetia Prozac Entresto Spironolactone   NOT AVAILABLE [facility]: Farxiga Digoxin Crestor Torsemide  - discussed counseling with pt and that [facility] has [facility].  Pt declined counselor appt  -pt to continue with cardiology as recommended  -pt to follow up  3-4 months.  He is to contact office sooner prn

## 2021-10-31 ENCOUNTER — Telehealth: Payer: Self-pay | Admitting: Physician Assistant

## 2021-10-31 MED ORDER — CITALOPRAM HYDROBROMIDE 20 MG PO TABS: 20.0000 mg | ORAL_TABLET | Freq: Every day | ORAL | 0 refills | Status: DC

## 2021-10-31 NOTE — Telephone Encounter (Signed)
Due to interaction plavix/prozac, will use citalopram in lieu of prozac.  Pt agreeable.  Will f/u with Evisit 2/15 to check response.

## 2021-11-06 ENCOUNTER — Other Ambulatory Visit: Payer: Self-pay | Admitting: Orthopedic Surgery

## 2021-11-20 ENCOUNTER — Encounter (HOSPITAL_COMMUNITY): Payer: Self-pay | Admitting: *Deleted

## 2021-11-20 NOTE — Progress Notes (Signed)
Attending Physician Statement of Disability completed, signed by Dr Shirlee Latch, and emailed back to pt at [email or web address]

## 2021-11-21 ENCOUNTER — Other Ambulatory Visit (HOSPITAL_COMMUNITY): Payer: Self-pay | Admitting: Cardiology

## 2021-11-22 ENCOUNTER — Telehealth (HOSPITAL_COMMUNITY): Payer: Self-pay | Admitting: Pharmacist

## 2021-11-22 ENCOUNTER — Other Ambulatory Visit (HOSPITAL_COMMUNITY): Payer: Self-pay

## 2021-11-22 MED ORDER — DIGOXIN 125 MCG PO TABS
0.1250 mg | ORAL_TABLET | Freq: Every day | ORAL | 2 refills | Status: DC
  Filled 2021-11-22: qty 34, 34d supply, fill #0
  Filled 2022-02-07: qty 34, 34d supply, fill #1
  Filled 2022-04-04: qty 34, 34d supply, fill #2
  Filled 2022-04-05: qty 30, 30d supply, fill #2
  Filled 2022-04-29: qty 30, 30d supply, fill #3

## 2021-11-22 MED ORDER — ENTRESTO 49-51 MG PO TABS: 1.0000 | ORAL_TABLET | Freq: Two times a day (BID) | ORAL | 11 refills | Status: DC

## 2021-11-22 MED ORDER — TORSEMIDE 20 MG PO TABS
40.0000 mg | ORAL_TABLET | Freq: Every day | ORAL | 5 refills | Status: DC
  Filled 2021-11-22: qty 60, 30d supply, fill #0
  Filled 2021-12-12: qty 60, 30d supply, fill #1
  Filled 2022-01-12: qty 60, 30d supply, fill #2

## 2021-11-22 MED ORDER — SPIRONOLACTONE 25 MG PO TABS
25.0000 mg | ORAL_TABLET | Freq: Every day | ORAL | 2 refills | Status: DC
  Filled 2021-11-22: qty 30, 30d supply, fill #0
  Filled 2022-01-12: qty 30, 30d supply, fill #1
  Filled 2022-04-04 – 2022-04-06 (×2): qty 30, 30d supply, fill #2

## 2021-11-22 NOTE — Telephone Encounter (Signed)
Received message from patient asking for torsemide prescription to be sent to [facility] as [place] does not cover this medication. Additionally, he would like Entresto sent to [facility] so it can be filled. Changes made and patient aware and appreciative.   Audry Riles, PharmD, BCPS, BCCP, CPP Heart Failure Clinic Pharmacist [phone]

## 2021-11-26 ENCOUNTER — Other Ambulatory Visit: Payer: Self-pay | Admitting: Orthopedic Surgery

## 2021-11-28 ENCOUNTER — Other Ambulatory Visit: Payer: Self-pay | Admitting: Orthopedic Surgery

## 2021-11-29 ENCOUNTER — Ambulatory Visit: Payer: Medicaid Other | Admitting: Physician Assistant

## 2021-11-29 DIAGNOSIS — F419 Anxiety disorder, unspecified: Secondary | ICD-10-CM

## 2021-11-29 DIAGNOSIS — I255 Ischemic cardiomyopathy: Secondary | ICD-10-CM

## 2021-11-29 NOTE — Progress Notes (Signed)
There were no vitals taken for this visit.   Subjective:    Patient ID: [name], male    DOB: [birth year], 41 y.o.   MRN: [id number]  HPI: [name] is a 41 y.o. male presenting on 11/29/2021 for No chief complaint on file.   HPI  This is a telemedicine appointment through [employer]  I connected with  [name] on 11/29/21 by a video enabled telemedicine application and verified that I am speaking with the correct person using two identifiers.   I discussed the limitations of evaluation and management by telemedicine. The patient expressed understanding and agreed to proceed.  Pt is at home.  Provider is at office.   Appointment today is to follow up on medication changes.  He has been On citalopram about 2 wk (changes from prozac).  He says he is Still a bit edgey fuse but the fuse is a bit longer now.        Relevant past medical, surgical, family and social history reviewed and updated as indicated. Interim medical history since our last visit reviewed. Allergies and medications reviewed and updated.    Current Outpatient Medications:    carvedilol (COREG) 6.25 MG tablet, Take 1.5 tablets (9.375 mg total) by mouth 2 (two) times daily., Disp: 270 tablet, Rfl: 3   citalopram (CELEXA) 20 MG tablet, Take 1 tablet (20 mg total) by mouth daily., Disp: 90 tablet, Rfl: 0   clopidogrel (PLAVIX) 75 MG tablet, TAKE 1 Tablet BY MOUTH ONCE DAILY, Disp: 90 tablet, Rfl: 0   dapagliflozin propanediol (FARXIGA) 10 MG TABS tablet, Take 1 tablet (10 mg total) by mouth daily before breakfast., Disp: 30 tablet, Rfl: 6   digoxin (LANOXIN) 0.125 MG tablet, Take 1 tablet (0.125 mg total) by mouth daily., Disp: 34 tablet, Rfl: 2   ezetimibe (ZETIA) 10 MG tablet, TAKE 1 Tablet BY MOUTH ONCE DAILY, Disp: 90 tablet, Rfl: 0   gabapentin (NEURONTIN) 300 MG capsule, TAKE 1 CAPSULE BY MOUTH THREE TIMES A DAY, Disp: 90 capsule, Rfl: 0   potassium chloride SA (KLOR-CON) 20 MEQ tablet, Take 20  mEq by mouth 2 (two) times daily., Disp: , Rfl:    spironolactone (ALDACTONE) 25 MG tablet, Take 1 tablet (25 mg total) by mouth at bedtime., Disp: 30 tablet, Rfl: 2   torsemide (DEMADEX) 20 MG tablet, Take 2 tablets (40 mg total) by mouth daily., Disp: 60 tablet, Rfl: 5   traZODone (DESYREL) 50 MG tablet, TAKE 1/2 TO 1 Tablet BY MOUTH ONCE EVERY NIGHT AT BEDTIME AS NEEDED FOR sleep, Disp: 90 tablet, Rfl: 0   Zinc 50 MG TABS, Take 50 mg by mouth in the morning., Disp: , Rfl:    aspirin 81 MG EC tablet, Take 1 tablet (81 mg total) by mouth daily. Swallow whole. (Patient not taking: Reported on 11/29/2021), Disp: 90 tablet, Rfl: 0   rosuvastatin (CRESTOR) 40 MG tablet, Take 1 tablet (40 mg total) by mouth daily. (Patient not taking: Reported on 11/29/2021), Disp: 90 tablet, Rfl: 3   sacubitril-valsartan (ENTRESTO) 49-51 MG, Take 1 tablet by mouth 2 (two) times daily. (Patient not taking: Reported on 11/29/2021), Disp: 60 tablet, Rfl: 11 No current facility-administered medications for this visit.  Facility-Administered Medications Ordered in Other Visits:    sodium chloride flush (NS) 0.9 % injection 3 mL, 3 mL, Intravenous, Q12H, Laurey Morale, MD    Review of Systems  Per HPI unless specifically indicated above     Objective:  There were no vitals taken for this visit.  Wt Readings from Last 3 Encounters:  09/26/21 238 lb 12.8 oz (108.3 kg)  07/19/21 247 lb (112 kg)  06/23/21 240 lb (108.9 kg)    Physical Exam Constitutional:      General: He is not in acute distress.    Appearance: He is not toxic-appearing.  HENT:     Head: Normocephalic and atraumatic.  Pulmonary:     Effort: Pulmonary effort is normal. No respiratory distress.     Comments: Pt is talking in complete sentences without dyspnea.  Neurological:     Mental Status: He is alert and oriented to person, place, and time.  Psychiatric:        Behavior: Behavior normal.          Assessment & Plan:     Encounter Diagnoses  Name Primary?   Anxiety Yes   Ischemic cardiomyopathy      -pt to Keep up with cardiology per their recomendations -pt to Continue citalopram -educated and encouraged pt to get Covid vaccination -pt was again offered counseling services which are available here at [facility] but pt Declines [facility] -pt to follow up may as scheduled.  He is to contact office sooner prn

## 2021-12-03 ENCOUNTER — Encounter: Payer: Self-pay | Admitting: Physician Assistant

## 2021-12-08 ENCOUNTER — Other Ambulatory Visit: Payer: Self-pay | Admitting: Orthopedic Surgery

## 2021-12-12 ENCOUNTER — Ambulatory Visit (INDEPENDENT_AMBULATORY_CARE_PROVIDER_SITE_OTHER): Payer: Self-pay | Admitting: Orthopedic Surgery

## 2021-12-12 ENCOUNTER — Encounter: Payer: Self-pay | Admitting: Orthopedic Surgery

## 2021-12-12 ENCOUNTER — Other Ambulatory Visit (HOSPITAL_COMMUNITY): Payer: Self-pay

## 2021-12-12 ENCOUNTER — Other Ambulatory Visit: Payer: Self-pay

## 2021-12-12 VITALS — BP 139/85 | HR 67 | Ht 69.0 in | Wt 250.0 lb

## 2021-12-12 DIAGNOSIS — G5612 Other lesions of median nerve, left upper limb: Secondary | ICD-10-CM

## 2021-12-12 MED ORDER — GABAPENTIN 300 MG PO CAPS
300.0000 mg | ORAL_CAPSULE | Freq: Three times a day (TID) | ORAL | 0 refills | Status: DC
  Filled 2021-12-12: qty 90, 30d supply, fill #0

## 2021-12-12 NOTE — Progress Notes (Signed)
Orthopaedic Clinic Return  Assessment: [name] is a 41 y.o. male with the following: Left hand neuropathy; following artery grafting improved with persistent symptoms   Plan: The pain, numbness and tingling in his left hand has improved.  Gabapentin does provide relief of the symptoms.  However, I think it is reasonable to obtain an EMG to evaluate the level of nerve irritation in the left wrist.  He is in agreement with this plan.  Once the results are available, we will see him in follow-up to discuss any further treatment options.  I have also provided him with an updated prescription for gabapentin.    Follow-up: Return for After EMG.   Subjective:  Chief Complaint  Patient presents with   Hand Pain    Lt hand neuropathy pain for years. Needs med refills    History of Present Illness: [name] is a 41 y.o. male who returns to clinic for repeat evaluation of left hand pain, numbness and tingling.  He was last seen in clinic almost year and a half ago for his symptoms.  He has done well with gabapentin.  He has noted improvements in his symptoms to the index and long fingers.  His symptoms are primarily in his left thumb at this time.  He notes that he has difficulty using the left hand as he did before.  He does drop items.  He has some issues with his dexterity.  Review of Systems: No fevers or chills + numbness & tingling No chest pain No shortness of breath No bowel or bladder dysfunction No GI distress No headaches   Objective: BP 139/85    Pulse 67    Ht 5\' 9"  (1.753 m)    Wt 250 lb (113.4 kg)    BMI 36.92 kg/m   Physical Exam:  Volar-based forearm incisions healing well.  There is no atrophy to the thenar eminence.  Decreased sensation over the volar thenar eminence.  Positive Tinel's in the carpal tunnel.  Positive Tinel's over the dorsal first webspace.  He is able to make a fist.  Fingers are warm and well-perfused.  IMAGING: I personally ordered  and reviewed the following images:  No new imaging obtained today.  , MD 12/12/2021 9:38 AM

## 2021-12-27 IMAGING — DX DG CHEST 1V PORT
1 series · 1 of 1 positions shown · non-contrast
Comparison: Film from earlier in the same day.

CLINICAL DATA: Chest pain

EXAM:
PORTABLE CHEST 1 VIEW

[chest ap]
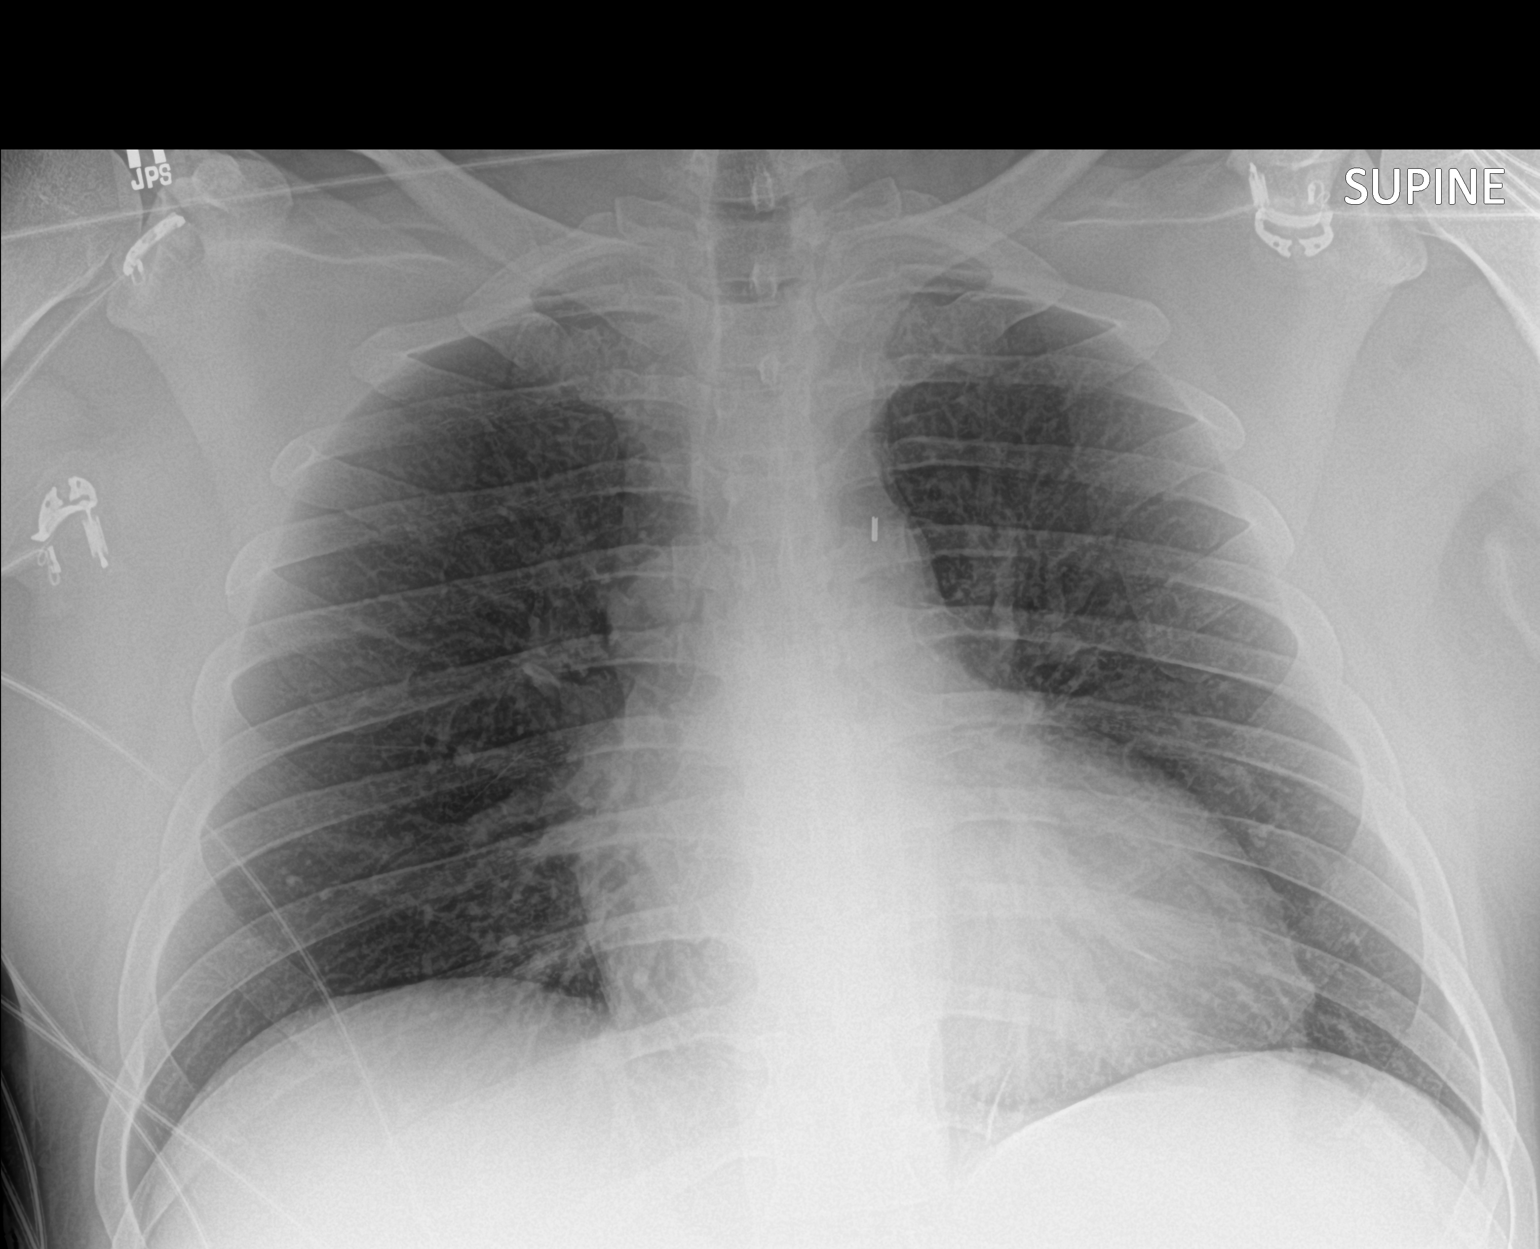

[1 of 1 positions shown; findings below may reference images not displayed]

FINDINGS: Intra-aortic balloon pump is now been placed with the tip over the
aortic knob. Cardiac shadow is at the upper limits of normal but
accentuated by the portable technique. Lungs are well aerated
without focal infiltrate or sizable effusion. No bony abnormality is
noted.
IMPRESSION: Intra-aortic balloon pump in adequate position. No other focal
abnormality is noted.

## 2021-12-28 IMAGING — DX DG CHEST 1V PORT
1 series · 1 of 1 positions shown · non-contrast
Comparison: 06/23/2020 and earlier.

CLINICAL DATA: 38-year-old male status post CABG postoperative day
zero.

EXAM:
PORTABLE CHEST 1 VIEW

[chest]
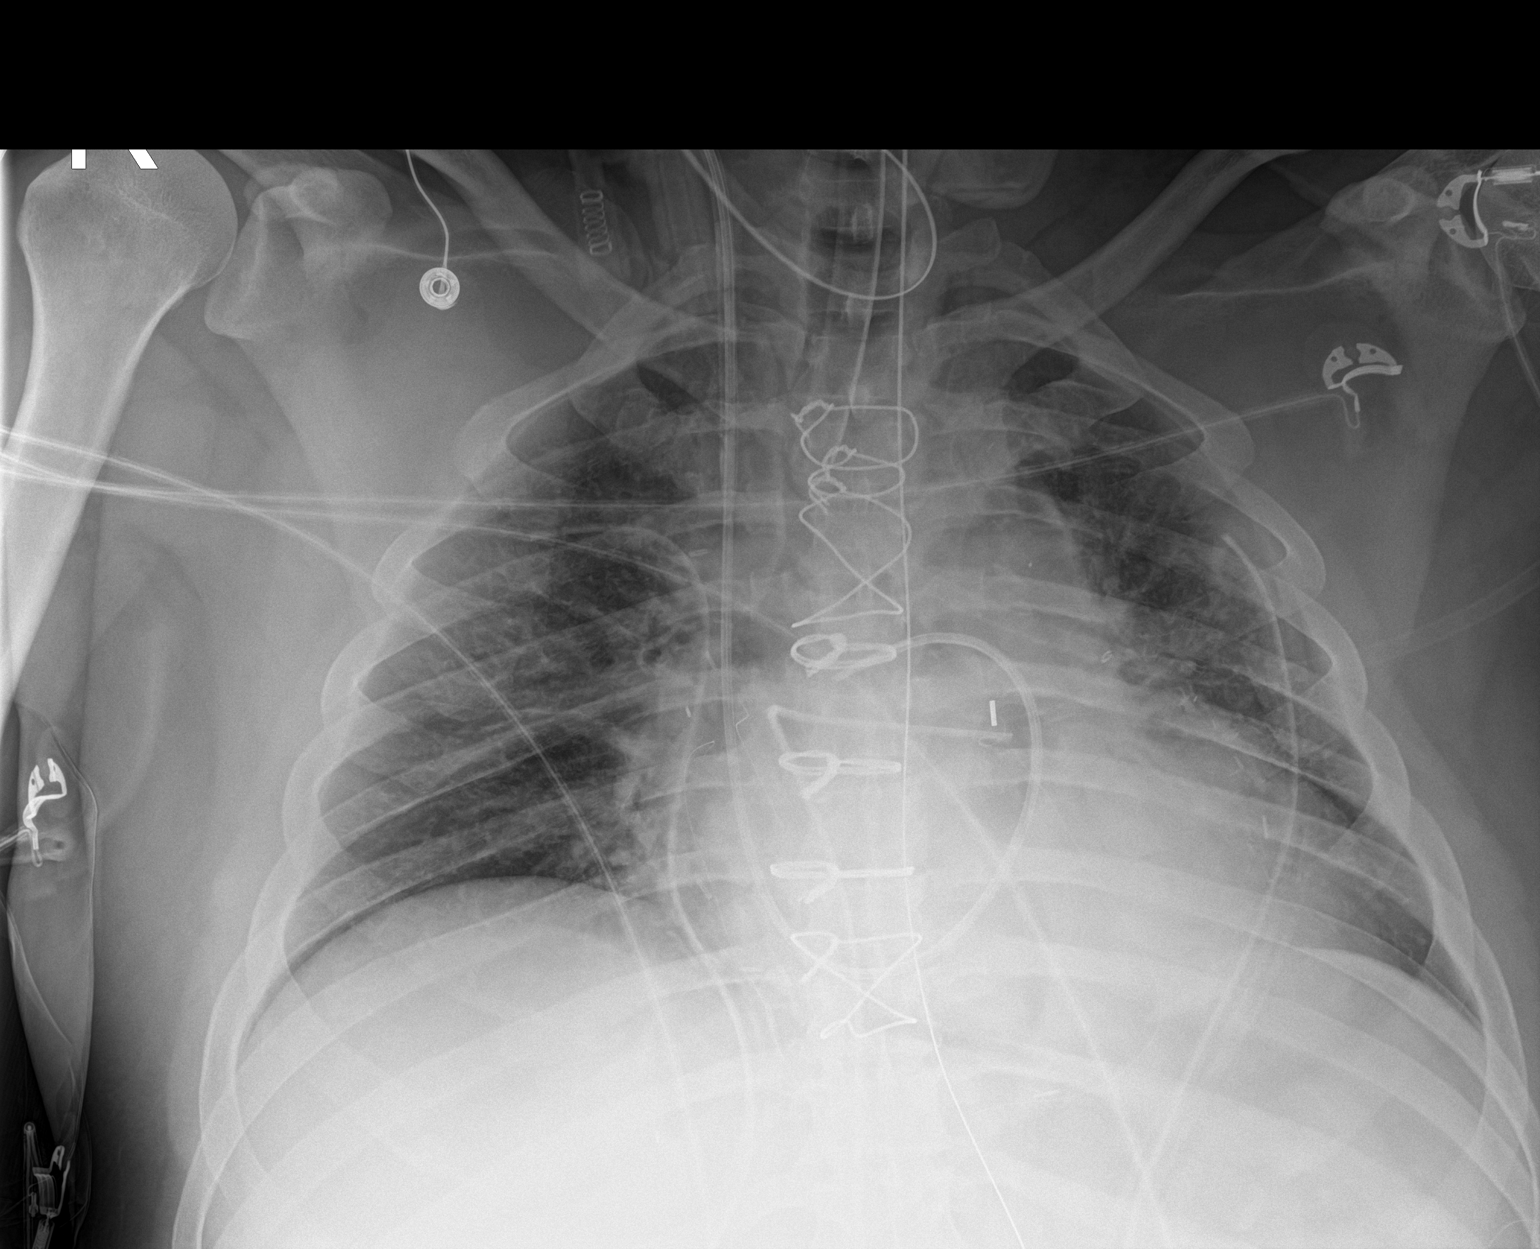

[1 of 1 positions shown; findings below may reference images not displayed]

FINDINGS: Portable AP supine view at 6226 hours. Endotracheal tube tip in good
position just below the clavicles. Enteric tube courses to the
abdomen, tip not included. Right IJ approach Swan-Ganz catheter, tip
at the right main pulmonary artery level. Bilateral chest tubes and
mediastinal tube in place. Intra-aortic balloon pump marker remains
at the level of the descending thoracic aorta.

Lower lung volumes. No pneumothorax, pulmonary edema or pleural
effusion identified. Mild perihilar atelectasis. Stable cardiac size
and mediastinal contours. Paucity of bowel gas in the upper abdomen.
IMPRESSION: 1. Lines and tubes appear appropriately placed. Intra-aortic balloon
pump marker in the descending thoracic aorta.
2. Atelectasis.  No pneumothorax or pulmonary edema.

## 2021-12-31 IMAGING — DX DG CHEST 1V PORT
1 series · 1 of 1 positions shown · non-contrast
Comparison: 06/25/2020

CLINICAL DATA: Pleural effusion, open heart surgery

EXAM:
PORTABLE CHEST 1 VIEW

[chest ap]
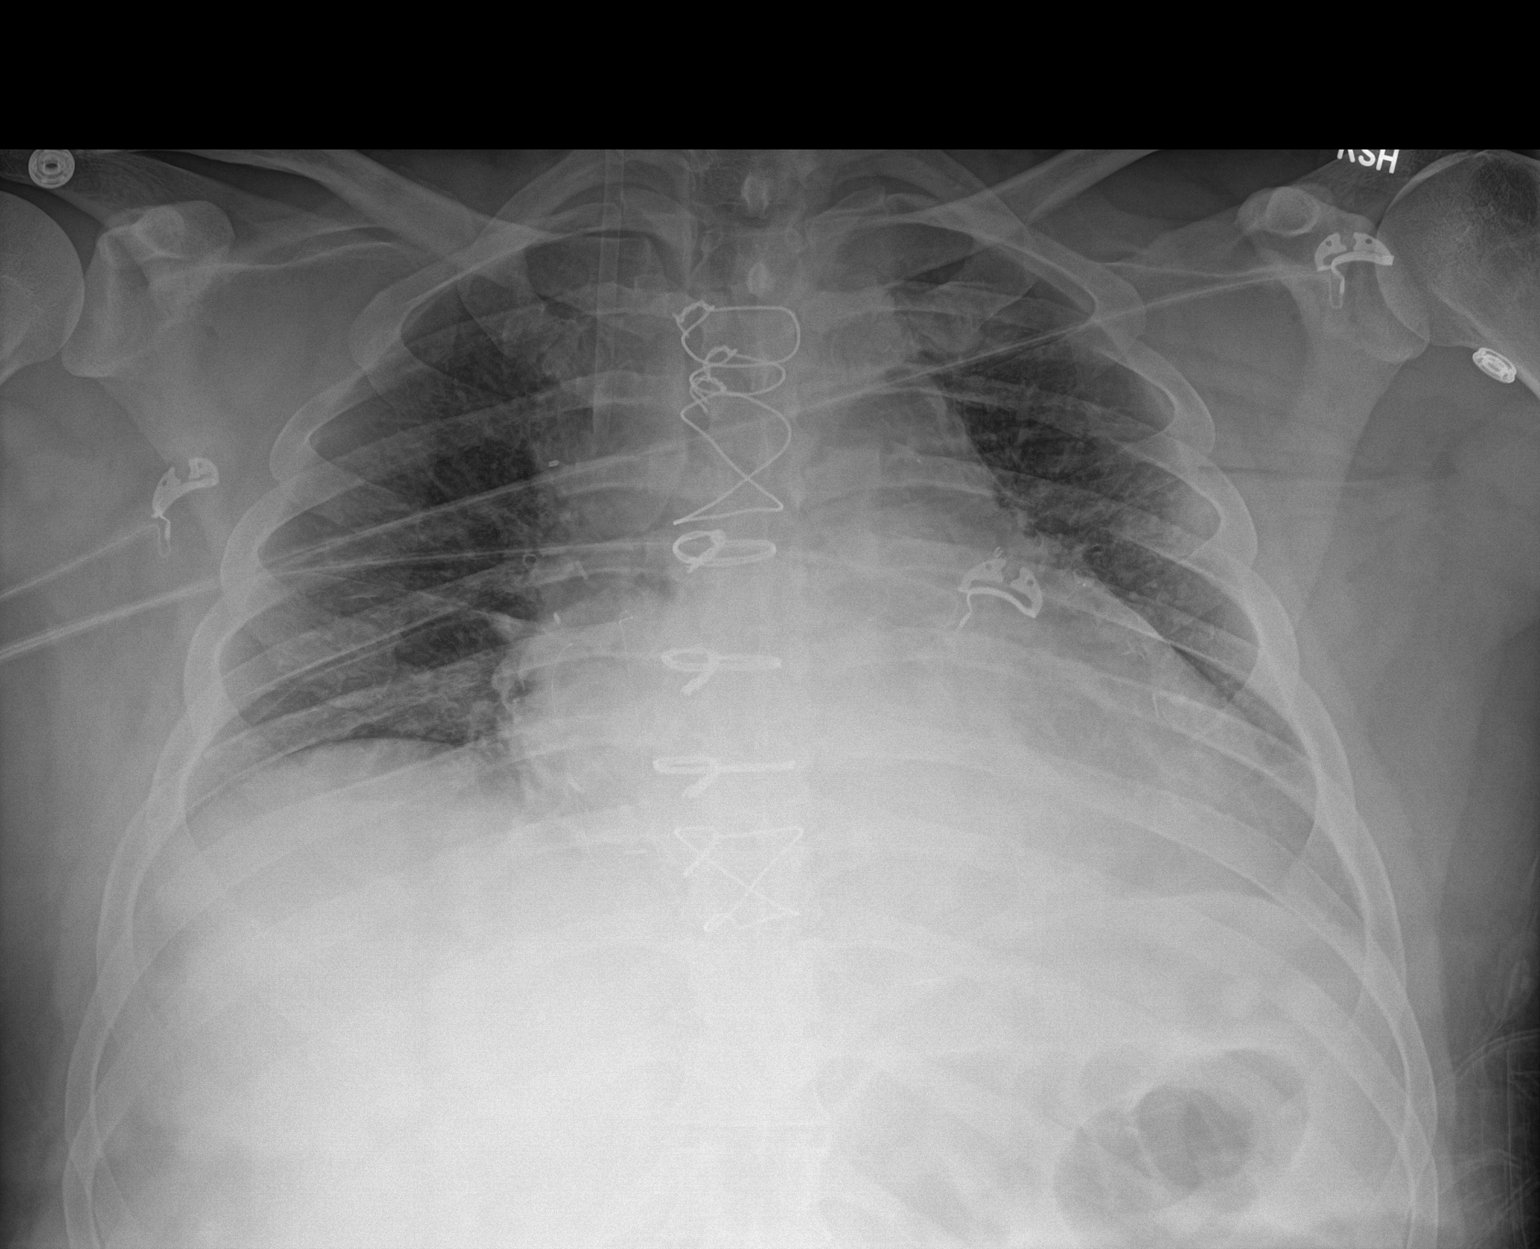

[1 of 1 positions shown; findings below may reference images not displayed]

FINDINGS: Interval extubation, removal of enteric tube, removal right IJ
Swan-Ganz catheter, removal intra-aortic balloon pump, and removal
of bilateral chest tubes/mediastinal drains.

Stable right IJ sheath. Postsurgical changes related to prior CABG.
Status post median sternotomy.

Lungs are essentially clear. No pneumothorax. No pleural effusion is
seen.

Mild cardiomegaly.
IMPRESSION: Interval extubation and removal of multiple support apparatus as
above.

No pneumothorax is seen.

## 2022-01-01 ENCOUNTER — Ambulatory Visit (INDEPENDENT_AMBULATORY_CARE_PROVIDER_SITE_OTHER): Payer: Self-pay

## 2022-01-01 DIAGNOSIS — I255 Ischemic cardiomyopathy: Secondary | ICD-10-CM

## 2022-01-02 LAB — CUP PACEART REMOTE DEVICE CHECK
Battery Remaining Longevity: 174 mo
Battery Remaining Percentage: 100 %
Brady Statistic RV Percent Paced: 0 %
Date Time Interrogation Session: 20230320000100
HighPow Impedance: 77 Ohm
Implantable Lead Implant Date: 20220909
Implantable Lead Location: 753860
Implantable Lead Model: 137
Implantable Lead Serial Number: 301248
Implantable Pulse Generator Implant Date: 20220909
Lead Channel Impedance Value: 695 Ohm
Lead Channel Pacing Threshold Amplitude: 0.8 V
Lead Channel Pacing Threshold Pulse Width: 0.4 ms
Lead Channel Setting Pacing Amplitude: 3.5 V
Lead Channel Setting Pacing Pulse Width: 0.4 ms
Lead Channel Setting Sensing Sensitivity: 0.6 mV
Pulse Gen Serial Number: 213174

## 2022-01-07 IMAGING — DX DG CHEST 2V
2 series · 2 of 2 positions shown · non-contrast
Comparison: June 28, 2020.

CLINICAL DATA: Recent coronary artery bypass grafting.
Hypertension.

EXAM:
CHEST - 2 VIEW

[dg chest 2 view (1 of 2)]
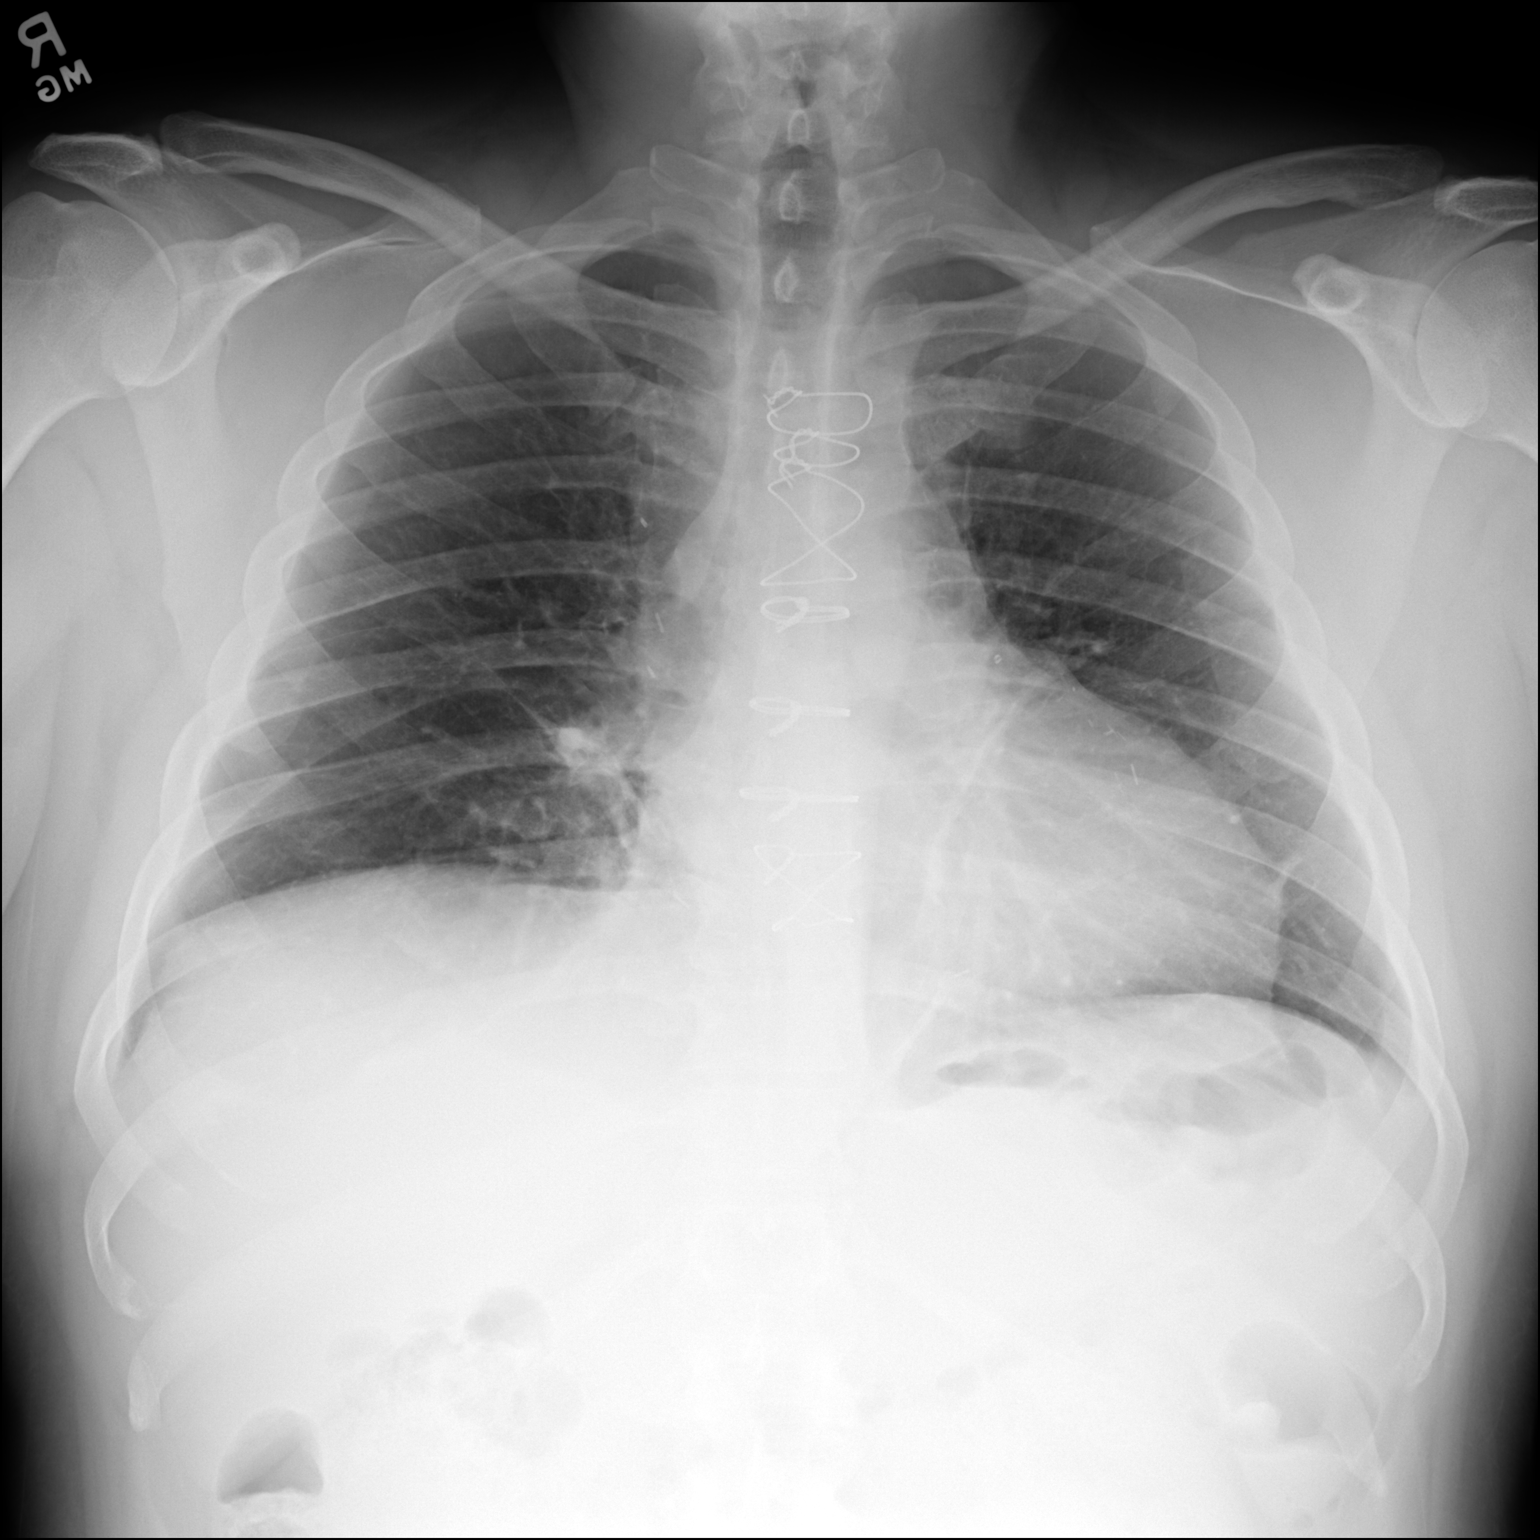

[dg chest 2 view (2 of 2)]
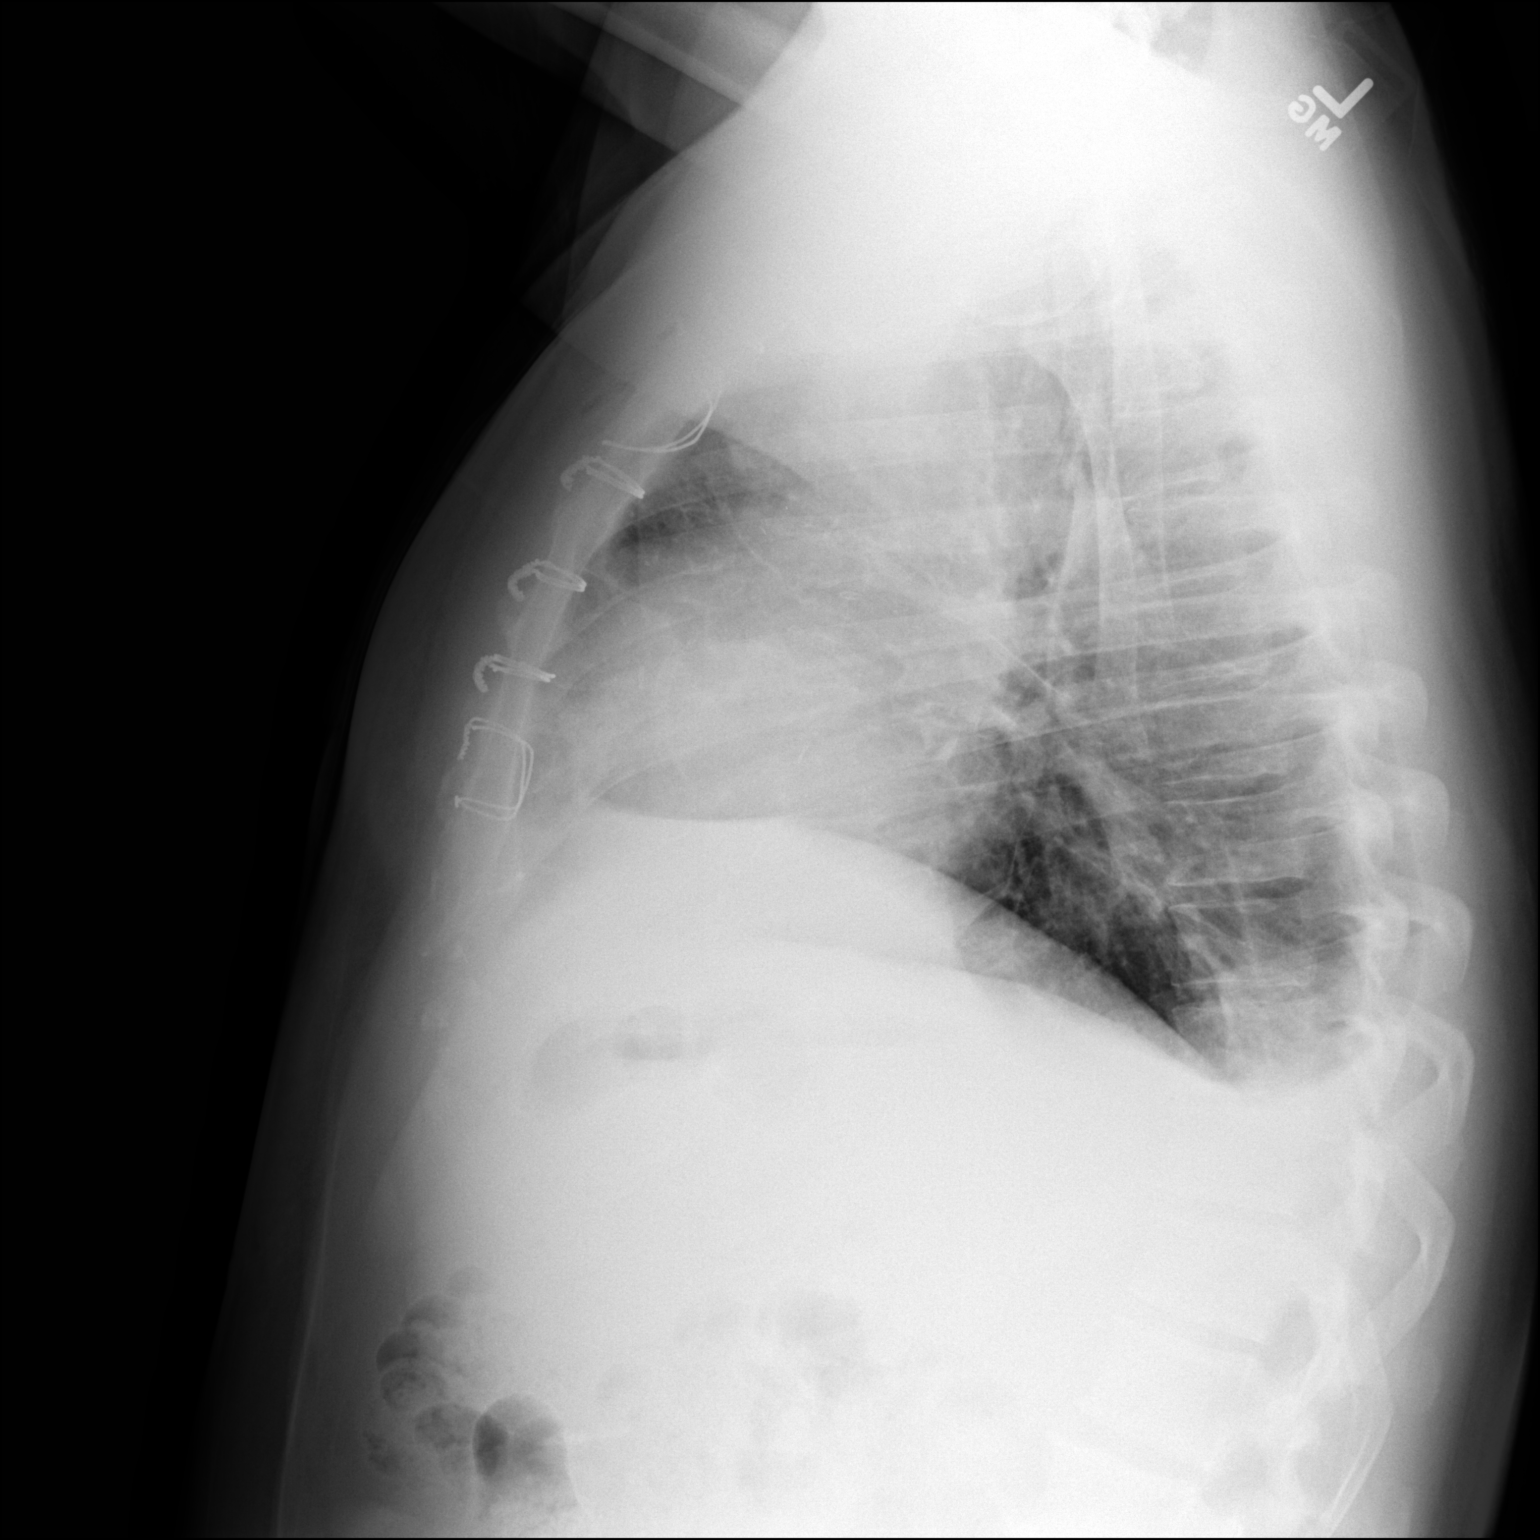

[2 of 2 positions shown; findings below may reference images not displayed]

FINDINGS: There is scarring with atelectasis in the infrahilar region on the
right. There are scattered foci of left base atelectasis. There is a
rather minimal left pleural effusion. No edema or airspace opacity.
Heart is upper normal in size with pulmonary vascularity normal. No
adenopathy. Status post median sternotomy. No pneumothorax. No
evident adenopathy. No bone lesions.
IMPRESSION: Areas of atelectatic change in the lower lung regions. Mild scarring
medial right base. Small left pleural effusion.

No edema or airspace opacity. Heart upper normal in size with
postoperative changes. No pneumothorax.

## 2022-01-09 NOTE — Progress Notes (Signed)
Remote ICD transmission.   

## 2022-01-10 ENCOUNTER — Encounter (HOSPITAL_COMMUNITY): Payer: Self-pay | Admitting: *Deleted

## 2022-01-10 NOTE — Progress Notes (Signed)
Pt's dropped of disability form for [employer], form completed, signed by Dr Shirlee Latch and emailed back to pt  ?

## 2022-01-12 ENCOUNTER — Other Ambulatory Visit (HOSPITAL_COMMUNITY): Payer: Self-pay

## 2022-01-12 ENCOUNTER — Other Ambulatory Visit: Payer: Self-pay | Admitting: Orthopedic Surgery

## 2022-01-12 MED ORDER — GABAPENTIN 300 MG PO CAPS
300.0000 mg | ORAL_CAPSULE | Freq: Three times a day (TID) | ORAL | 0 refills | Status: DC
  Filled 2022-01-12 – 2022-02-07 (×2): qty 90, 30d supply, fill #0

## 2022-01-13 ENCOUNTER — Other Ambulatory Visit (HOSPITAL_COMMUNITY): Payer: Self-pay

## 2022-01-15 ENCOUNTER — Other Ambulatory Visit (HOSPITAL_COMMUNITY): Payer: Self-pay

## 2022-01-17 ENCOUNTER — Other Ambulatory Visit: Payer: Self-pay | Admitting: Physician Assistant

## 2022-01-17 DIAGNOSIS — E785 Hyperlipidemia, unspecified: Secondary | ICD-10-CM

## 2022-01-23 ENCOUNTER — Other Ambulatory Visit (HOSPITAL_COMMUNITY): Payer: Self-pay

## 2022-02-07 ENCOUNTER — Other Ambulatory Visit (HOSPITAL_COMMUNITY): Payer: Self-pay

## 2022-02-07 ENCOUNTER — Other Ambulatory Visit (HOSPITAL_COMMUNITY): Payer: Self-pay | Admitting: Cardiology
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
## 2022-02-08 ENCOUNTER — Other Ambulatory Visit (HOSPITAL_COMMUNITY): Payer: Self-pay

## 2022-02-08 ENCOUNTER — Ambulatory Visit (HOSPITAL_COMMUNITY)
Admission: RE | Admit: 2022-02-08 | Discharge: 2022-02-08 | Disposition: A | Discharge status: Home / Self Care | Payer: Self-pay | Source: Ambulatory Visit | Attending: Physician Assistant | Admitting: Physician Assistant

## 2022-02-08 ENCOUNTER — Encounter (HOSPITAL_COMMUNITY): Payer: Self-pay

## 2022-02-08 ENCOUNTER — Telehealth (HOSPITAL_COMMUNITY): Payer: Self-pay | Admitting: Pharmacy Technician

## 2022-02-08 VITALS — BP 102/68 | HR 67 | Ht 66.0 in | Wt 249.2 lb

## 2022-02-08 DIAGNOSIS — Z79899 Other long term (current) drug therapy: Secondary | ICD-10-CM | POA: Insufficient documentation

## 2022-02-08 DIAGNOSIS — I110 Hypertensive heart disease with heart failure: Secondary | ICD-10-CM | POA: Insufficient documentation

## 2022-02-08 DIAGNOSIS — Z8719 Personal history of other diseases of the digestive system: Secondary | ICD-10-CM | POA: Insufficient documentation

## 2022-02-08 DIAGNOSIS — Z95810 Presence of automatic (implantable) cardiac defibrillator: Secondary | ICD-10-CM | POA: Insufficient documentation

## 2022-02-08 DIAGNOSIS — I4589 Other specified conduction disorders: Secondary | ICD-10-CM | POA: Insufficient documentation

## 2022-02-08 DIAGNOSIS — Z951 Presence of aortocoronary bypass graft: Secondary | ICD-10-CM | POA: Insufficient documentation

## 2022-02-08 DIAGNOSIS — I5022 Chronic systolic (congestive) heart failure: Secondary | ICD-10-CM

## 2022-02-08 DIAGNOSIS — G4733 Obstructive sleep apnea (adult) (pediatric): Secondary | ICD-10-CM

## 2022-02-08 DIAGNOSIS — Z7902 Long term (current) use of antithrombotics/antiplatelets: Secondary | ICD-10-CM | POA: Insufficient documentation

## 2022-02-08 DIAGNOSIS — I255 Ischemic cardiomyopathy: Secondary | ICD-10-CM

## 2022-02-08 DIAGNOSIS — I2510 Atherosclerotic heart disease of native coronary artery without angina pectoris: Secondary | ICD-10-CM

## 2022-02-08 DIAGNOSIS — I252 Old myocardial infarction: Secondary | ICD-10-CM | POA: Insufficient documentation

## 2022-02-08 LAB — COMPREHENSIVE METABOLIC PANEL
ALT: 22 U/L (ref 0–44)
AST: 21 U/L (ref 15–41)
Albumin: 3.6 g/dL (ref 3.5–5.0)
Alkaline Phosphatase: 57 U/L (ref 38–126)
Anion gap: 8 (ref 5–15)
BUN: 8 mg/dL (ref 6–20)
CO2: 23 mmol/L (ref 22–32)
Calcium: 8.9 mg/dL (ref 8.9–10.3)
Chloride: 106 mmol/L (ref 98–111)
Creatinine, Ser: 0.99 mg/dL (ref 0.61–1.24)
GFR, Estimated: >60 mL/min (ref >60)
Glucose, Bld: 100 mg/dL — ABNORMAL HIGH (ref 70–99)
Potassium: 3.4 mmol/L — ABNORMAL LOW (ref 3.5–5.1)
Sodium: 137 mmol/L (ref 135–145)
Total Bilirubin: 0.5 mg/dL (ref 0.3–1.2)
Total Protein: 6.6 g/dL (ref 6.5–8.1)

## 2022-02-08 LAB — DIGOXIN LEVEL: Digoxin Level: <0.2 ng/mL — ABNORMAL LOW (ref 0.8–2.0)

## 2022-02-08 LAB — BRAIN NATRIURETIC PEPTIDE: B Natriuretic Peptide: 61.5 pg/mL (ref 0.0–100.0)

## 2022-02-08 MED ORDER — ENTRESTO 24-26 MG PO TABS
1.0000 | ORAL_TABLET | Freq: Two times a day (BID) | ORAL | 3 refills | Status: DC
  Filled 2022-02-08 (×2): qty 60, 30d supply, fill #0
  Filled 2022-04-04: qty 60, 30d supply, fill #1

## 2022-02-08 MED ORDER — CARVEDILOL 6.25 MG PO TABS
6.2500 mg | ORAL_TABLET | Freq: Two times a day (BID) | ORAL | 3 refills | Status: DC
  Filled 2022-02-08: qty 60, 30d supply, fill #0
  Filled 2022-04-04 – 2022-04-05 (×2): qty 60, 30d supply, fill #1
  Filled 2022-04-29 – 2022-05-16 (×2): qty 60, 30d supply, fill #2
  Filled 2022-06-10: qty 60, 30d supply, fill #3

## 2022-02-08 MED ORDER — ROSUVASTATIN CALCIUM 40 MG PO TABS
40.0000 mg | ORAL_TABLET | Freq: Every day | ORAL | 3 refills | Status: DC
  Filled 2022-02-08: qty 30, 30d supply, fill #0
  Filled 2022-04-04 – 2022-04-05 (×2): qty 30, 30d supply, fill #1
  Filled 2022-04-29 – 2022-05-16 (×2): qty 30, 30d supply, fill #2
  Filled 2022-06-10: qty 30, 30d supply, fill #3

## 2022-02-08 NOTE — Telephone Encounter (Signed)
Advanced Heart Failure Patient Advocate Encounter ? ?Patient was seen in clinic today and has had a change in assistance status for Entresto. [place] [facility] only distributes Entresto to residents of [facility]. He is currently uninsured. ? ?Started an Landscape architect for [employer] patient assistance. The patient is aware POI is required. ?

## 2022-02-08 NOTE — Patient Instructions (Signed)
Thank you for coming in today ? ?Labs were done today, if any labs are abnormal the clinic will call you ?No news is good news  ? ?RESTART Entresto 24/26 mg 1 tablet twice daily  ? ?DECREASE Coreg 6.25 mg 1 tablet twice daily ? ?Your physician recommends that you return for lab work in: 2 weeks for BMET ? ?Your physician recommends that you schedule a follow-up appointment in:  ?4 weeks In clinic ? ?Our social worker will be giving you a call ? ?At the [facility], you and your health needs are our priority. As part of our continuing mission to provide you with exceptional heart care, we have created designated Provider Care Teams. These Care Teams include your primary Cardiologist (physician) and Advanced Practice Providers (APPs- Physician Assistants and Nurse Practitioners) who all work together to provide you with the care you need, when you need it.  ? ?You may see any of the following providers on your designated Care Team at your next follow up: ?Dr Arvilla Meres ?Dr Marca Ancona ?Tonye Becket, NP ?Robbie Lis, PA ?Jessica Milford,NP ?Anna Genre, PA ?Karle Plumber, PharmD ? ? ?Please be sure to bring in all your medications bottles to every appointment.  ? ?If you have any questions or concerns before your next appointment please send [place] a message through [place] or call our office at [phone].   ? ?TO LEAVE A MESSAGE FOR THE NURSE SELECT OPTION 2, PLEASE LEAVE A MESSAGE INCLUDING: ?YOUR NAME ?DATE OF BIRTH ?CALL BACK NUMBER ?REASON FOR CALL**this is important as we prioritize the call backs ? ?YOU WILL RECEIVE A CALL BACK THE SAME DAY AS LONG AS YOU CALL BEFORE 4:00 PM ?  ?

## 2022-02-08 NOTE — Progress Notes (Addendum)
Medication Samples have been provided to the patient. ? ?Drug name: entresto       Strength: 24/26mg        Qty: 56  LOT: [id number]  Exp.Date: 01/2024 ? ?Dosing instructions: one tab twice a day ? ? ?The patient has been instructed regarding the correct time, dose, and frequency of taking this medication, including desired effects and most common side effects.  ? ?Theresia Bough ?10:26 AM ?02/08/2022 ? ?

## 2022-02-09 ENCOUNTER — Telehealth (HOSPITAL_COMMUNITY): Payer: Self-pay | Admitting: Licensed Clinical Social Worker

## 2022-02-09 ENCOUNTER — Other Ambulatory Visit (HOSPITAL_COMMUNITY): Payer: Self-pay | Admitting: Cardiology

## 2022-02-09 DIAGNOSIS — I5022 Chronic systolic (congestive) heart failure: Secondary | ICD-10-CM

## 2022-02-09 NOTE — Telephone Encounter (Signed)
CSW consulted to follow back up with pt regarding disability. ? ?Pt reports he was denied and is now in process of appeal/application with the assistance of a lawyer. ? ?CSW discussed applying for CAFA but pt said it was not helpful last time because only approved at 50% coverage despite no income or assets.  CSW sent message to financial counselor who processes application to get clarification and see if worth pt applying again ? ?Will continue to follow and assist as needed ? ?Burna Sis, LCSW ?Clinical Social Worker ?[facility] ?Desk#: [phone] ?Cell#: [phone] ? ?

## 2022-02-12 ENCOUNTER — Telehealth (HOSPITAL_COMMUNITY): Payer: Self-pay | Admitting: Licensed Clinical Social Worker

## 2022-02-12 NOTE — Telephone Encounter (Signed)
CSW spoke with financial counselor regarding pt previous CAFA 50% coverage.  They reviewed case and corrected account to be 75%- states it was not 100% due to deposits being made into bank account which they counted as income- pt reports his mom was providing him money to help with expenses. ? ?CSW spoke with pt regarding above- he confirms he is interested in applying for CAFA again- CSW put application in the mail. ? ?Will continue to follow and assist as needed ? ?Burna Sis, LCSW ?Clinical Social Worker ?[facility] ?Desk#: [phone] ?Cell#: [phone] ? ?

## 2022-02-19 ENCOUNTER — Ambulatory Visit: Payer: Medicaid Other | Admitting: Physician Assistant

## 2022-02-20 NOTE — Telephone Encounter (Signed)
Advanced Heart Failure Patient Advocate Encounter ? ?Called and spoke with patient. Send docusign link for signature. The patient is aware that POI is required. He does not have any at this time. He is aware that he would have to speak with [employer] and have an income attestation form sent from the company to be filled in before the application will be approved.  ?

## 2022-02-21 ENCOUNTER — Encounter: Payer: Self-pay | Admitting: Physician Assistant

## 2022-02-23 ENCOUNTER — Other Ambulatory Visit: Payer: Self-pay | Admitting: Cardiology

## 2022-02-23 ENCOUNTER — Encounter (HOSPITAL_COMMUNITY): Payer: Self-pay

## 2022-03-16 NOTE — Telephone Encounter (Signed)
Advanced Heart Failure Patient Advocate Encounter  Called and reminded patient to sign the [employer] application. Patient will call to get an income attestation letter sent after he sings the application via docusign.

## 2022-03-26 ENCOUNTER — Encounter (HOSPITAL_COMMUNITY): Payer: Self-pay

## 2022-03-26 ENCOUNTER — Ambulatory Visit (HOSPITAL_COMMUNITY)
Admission: RE | Admit: 2022-03-26 | Discharge: 2022-03-26 | Disposition: A | Discharge status: Home / Self Care | Payer: Medicaid Other | Source: Ambulatory Visit | Attending: Family Medicine | Admitting: Family Medicine

## 2022-03-26 VITALS — BP 118/78 | HR 55 | Wt 239.4 lb

## 2022-03-26 DIAGNOSIS — I4589 Other specified conduction disorders: Secondary | ICD-10-CM | POA: Insufficient documentation

## 2022-03-26 DIAGNOSIS — Z7902 Long term (current) use of antithrombotics/antiplatelets: Secondary | ICD-10-CM | POA: Insufficient documentation

## 2022-03-26 DIAGNOSIS — Z95810 Presence of automatic (implantable) cardiac defibrillator: Secondary | ICD-10-CM | POA: Insufficient documentation

## 2022-03-26 DIAGNOSIS — I5022 Chronic systolic (congestive) heart failure: Secondary | ICD-10-CM | POA: Insufficient documentation

## 2022-03-26 DIAGNOSIS — I2510 Atherosclerotic heart disease of native coronary artery without angina pectoris: Secondary | ICD-10-CM | POA: Insufficient documentation

## 2022-03-26 DIAGNOSIS — I110 Hypertensive heart disease with heart failure: Secondary | ICD-10-CM | POA: Insufficient documentation

## 2022-03-26 DIAGNOSIS — Z955 Presence of coronary angioplasty implant and graft: Secondary | ICD-10-CM | POA: Insufficient documentation

## 2022-03-26 DIAGNOSIS — I255 Ischemic cardiomyopathy: Secondary | ICD-10-CM | POA: Insufficient documentation

## 2022-03-26 DIAGNOSIS — Z87891 Personal history of nicotine dependence: Secondary | ICD-10-CM | POA: Insufficient documentation

## 2022-03-26 DIAGNOSIS — I252 Old myocardial infarction: Secondary | ICD-10-CM | POA: Insufficient documentation

## 2022-03-26 DIAGNOSIS — E785 Hyperlipidemia, unspecified: Secondary | ICD-10-CM | POA: Insufficient documentation

## 2022-03-26 DIAGNOSIS — Z79899 Other long term (current) drug therapy: Secondary | ICD-10-CM | POA: Insufficient documentation

## 2022-03-26 DIAGNOSIS — R29818 Other symptoms and signs involving the nervous system: Secondary | ICD-10-CM

## 2022-03-26 DIAGNOSIS — Z951 Presence of aortocoronary bypass graft: Secondary | ICD-10-CM | POA: Insufficient documentation

## 2022-03-26 DIAGNOSIS — E669 Obesity, unspecified: Secondary | ICD-10-CM | POA: Insufficient documentation

## 2022-03-26 LAB — BASIC METABOLIC PANEL
Anion gap: 6 (ref 5–15)
BUN: 6 mg/dL (ref 6–20)
CO2: 24 mmol/L (ref 22–32)
Calcium: 8.9 mg/dL (ref 8.9–10.3)
Chloride: 108 mmol/L (ref 98–111)
Creatinine, Ser: 0.96 mg/dL (ref 0.61–1.24)
GFR, Estimated: >60 mL/min (ref >60)
Glucose, Bld: 97 mg/dL (ref 70–99)
Potassium: 3.2 mmol/L — ABNORMAL LOW (ref 3.5–5.1)
Sodium: 138 mmol/L (ref 135–145)

## 2022-03-26 LAB — BRAIN NATRIURETIC PEPTIDE: B Natriuretic Peptide: 87.3 pg/mL (ref 0.0–100.0)

## 2022-03-26 LAB — DIGOXIN LEVEL: Digoxin Level: <0.2 ng/mL — ABNORMAL LOW (ref 0.8–2.0)
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
## 2022-03-26 NOTE — Progress Notes (Signed)
ReDS Vest / Clip - 03/26/22 1000       ReDS Vest / Clip   Station Marker D    Ruler Value 34    ReDS Value Range Low volume    ReDS Actual Value 35

## 2022-03-26 NOTE — Patient Instructions (Signed)
Thank you for coming in today  Labs were done today, if any labs are abnormal the clinic will call you No news is good news  Sleep Study has been ordered for you, they will call you for appointment details   PLEASE set a alarm to take your medications consistently   Your physician recommends that you schedule a follow-up appointment in:  4 weeks in clinic  3 months with Dr. Aundra Dubin  At the [facility], you and your health needs are our priority. As part of our continuing mission to provide you with exceptional heart care, we have created designated Provider Care Teams. These Care Teams include your primary Cardiologist (physician) and Advanced Practice Providers (APPs- Physician Assistants and Nurse Practitioners) who all work together to provide you with the care you need, when you need it.   You may see any of the following providers on your designated Care Team at your next follow up: Dr Glori Bickers Dr Haynes Kerns, NP Lyda Jester, [place] [facility] [place], [place] Audry Riles, PharmD   Please be sure to bring in all your medications bottles to every appointment.   If you have any questions or concerns before your next appointment please send [place] a message through [place] or call our office at [phone].    TO LEAVE A MESSAGE FOR THE NURSE SELECT OPTION 2, PLEASE LEAVE A MESSAGE INCLUDING: YOUR NAME DATE OF BIRTH CALL BACK NUMBER REASON FOR CALL**this is important as we prioritize the call backs  YOU WILL RECEIVE A CALL BACK THE SAME DAY AS LONG AS YOU CALL BEFORE 4:00 PM

## 2022-03-27 ENCOUNTER — Telehealth (HOSPITAL_COMMUNITY): Payer: Self-pay

## 2022-03-27 DIAGNOSIS — I5022 Chronic systolic (congestive) heart failure: Secondary | ICD-10-CM

## 2022-03-27 NOTE — Telephone Encounter (Signed)
Pt aware, agreeable, and verbalized understanding Lab scheduled.

## 2022-03-27 NOTE — Telephone Encounter (Signed)
-----   Message from Jacklynn Ganong, [place] sent at 03/26/2022  1:38 PM EDT ----- K is low. He has not been consistently taking his medication.  Please have him take extra 40 KCL today and consistently take home dose of 20 KCL bid.  Repeat BMET in 7-10 days.

## 2022-04-02 ENCOUNTER — Ambulatory Visit (INDEPENDENT_AMBULATORY_CARE_PROVIDER_SITE_OTHER): Payer: Self-pay

## 2022-04-02 DIAGNOSIS — I255 Ischemic cardiomyopathy: Secondary | ICD-10-CM

## 2022-04-04 ENCOUNTER — Other Ambulatory Visit: Payer: Self-pay | Admitting: Orthopedic Surgery

## 2022-04-04 ENCOUNTER — Other Ambulatory Visit (HOSPITAL_COMMUNITY): Payer: Self-pay | Admitting: *Deleted

## 2022-04-04 ENCOUNTER — Other Ambulatory Visit (HOSPITAL_COMMUNITY): Payer: Self-pay

## 2022-04-04 LAB — CUP PACEART REMOTE DEVICE CHECK
Battery Remaining Longevity: 162 mo
Battery Remaining Percentage: 100 %
Brady Statistic RV Percent Paced: 0 %
Date Time Interrogation Session: 20230620124800
HighPow Impedance: 89 Ohm
Implantable Lead Implant Date: 20220909
Implantable Lead Location: 753860
Implantable Lead Model: 137
Implantable Lead Serial Number: 301248
Implantable Pulse Generator Implant Date: 20220909
Lead Channel Impedance Value: 764 Ohm
Lead Channel Pacing Threshold Amplitude: 0.9 V
Lead Channel Pacing Threshold Pulse Width: 0.4 ms
Lead Channel Setting Pacing Amplitude: 3.5 V
Lead Channel Setting Pacing Pulse Width: 0.4 ms
Lead Channel Setting Sensing Sensitivity: 0.6 mV
Pulse Gen Serial Number: 213174

## 2022-04-04 MED ORDER — ENTRESTO 24-26 MG PO TABS: 1.0000 | ORAL_TABLET | Freq: Two times a day (BID) | ORAL | 3 refills | Status: DC

## 2022-04-04 MED ORDER — GABAPENTIN 300 MG PO CAPS
300.0000 mg | ORAL_CAPSULE | Freq: Three times a day (TID) | ORAL | 0 refills | Status: DC
  Filled 2022-04-04 – 2022-04-05 (×2): qty 90, 30d supply, fill #0

## 2022-04-05 ENCOUNTER — Other Ambulatory Visit (HOSPITAL_COMMUNITY): Payer: Self-pay

## 2022-04-05 ENCOUNTER — Ambulatory Visit (HOSPITAL_COMMUNITY)
Admission: RE | Admit: 2022-04-05 | Discharge: 2022-04-05 | Disposition: A | Discharge status: Home / Self Care | Payer: Medicaid Other | Source: Ambulatory Visit | Attending: Internal Medicine | Admitting: Internal Medicine

## 2022-04-05 DIAGNOSIS — I5022 Chronic systolic (congestive) heart failure: Secondary | ICD-10-CM | POA: Insufficient documentation

## 2022-04-05 LAB — BASIC METABOLIC PANEL
Anion gap: 8 (ref 5–15)
BUN: 8 mg/dL (ref 6–20)
CO2: 26 mmol/L (ref 22–32)
Calcium: 8.8 mg/dL — ABNORMAL LOW (ref 8.9–10.3)
Chloride: 105 mmol/L (ref 98–111)
Creatinine, Ser: 1.05 mg/dL (ref 0.61–1.24)
GFR, Estimated: >60 mL/min (ref >60)
Glucose, Bld: 98 mg/dL (ref 70–99)
Potassium: 4.2 mmol/L (ref 3.5–5.1)
Sodium: 139 mmol/L (ref 135–145)

## 2022-04-06 ENCOUNTER — Other Ambulatory Visit (HOSPITAL_COMMUNITY): Payer: Self-pay

## 2022-04-12 NOTE — Telephone Encounter (Signed)
Advanced Heart Failure Patient Advocate Encounter  Sent in application via fax Monday 06/26. Patient will need to call [employer] to get income attestation letter. Document scanned to chart.   Will follow up.

## 2022-04-19 NOTE — Progress Notes (Signed)
Remote ICD transmission.   

## 2022-04-23 ENCOUNTER — Encounter (HOSPITAL_COMMUNITY): Payer: Self-pay

## 2022-04-23 ENCOUNTER — Encounter: Payer: Self-pay | Admitting: Cardiology

## 2022-04-23 ENCOUNTER — Ambulatory Visit (HOSPITAL_COMMUNITY)
Admission: RE | Admit: 2022-04-23 | Discharge: 2022-04-23 | Disposition: A | Discharge status: Home / Self Care | Payer: Medicaid Other | Source: Ambulatory Visit | Attending: Family Medicine | Admitting: Family Medicine

## 2022-04-23 VITALS — BP 128/78 | HR 65 | Wt 233.6 lb

## 2022-04-23 DIAGNOSIS — I2510 Atherosclerotic heart disease of native coronary artery without angina pectoris: Secondary | ICD-10-CM

## 2022-04-23 DIAGNOSIS — Z951 Presence of aortocoronary bypass graft: Secondary | ICD-10-CM | POA: Insufficient documentation

## 2022-04-23 DIAGNOSIS — I255 Ischemic cardiomyopathy: Secondary | ICD-10-CM | POA: Insufficient documentation

## 2022-04-23 DIAGNOSIS — E669 Obesity, unspecified: Secondary | ICD-10-CM | POA: Insufficient documentation

## 2022-04-23 DIAGNOSIS — I110 Hypertensive heart disease with heart failure: Secondary | ICD-10-CM | POA: Insufficient documentation

## 2022-04-23 DIAGNOSIS — I4589 Other specified conduction disorders: Secondary | ICD-10-CM | POA: Insufficient documentation

## 2022-04-23 DIAGNOSIS — J069 Acute upper respiratory infection, unspecified: Secondary | ICD-10-CM

## 2022-04-23 DIAGNOSIS — Z8719 Personal history of other diseases of the digestive system: Secondary | ICD-10-CM | POA: Insufficient documentation

## 2022-04-23 DIAGNOSIS — R29818 Other symptoms and signs involving the nervous system: Secondary | ICD-10-CM

## 2022-04-23 DIAGNOSIS — Z79899 Other long term (current) drug therapy: Secondary | ICD-10-CM | POA: Insufficient documentation

## 2022-04-23 DIAGNOSIS — Z95810 Presence of automatic (implantable) cardiac defibrillator: Secondary | ICD-10-CM | POA: Insufficient documentation

## 2022-04-23 DIAGNOSIS — E785 Hyperlipidemia, unspecified: Secondary | ICD-10-CM | POA: Insufficient documentation

## 2022-04-23 DIAGNOSIS — Z87891 Personal history of nicotine dependence: Secondary | ICD-10-CM | POA: Insufficient documentation

## 2022-04-23 DIAGNOSIS — I5022 Chronic systolic (congestive) heart failure: Secondary | ICD-10-CM

## 2022-04-23 DIAGNOSIS — I252 Old myocardial infarction: Secondary | ICD-10-CM | POA: Insufficient documentation

## 2022-04-23 DIAGNOSIS — Z7902 Long term (current) use of antithrombotics/antiplatelets: Secondary | ICD-10-CM | POA: Insufficient documentation

## 2022-04-23 NOTE — Progress Notes (Signed)
Patient Name: [name]        DOB: [birth year]      Height: 5'6"    PIRJJO:[id number]  Office Name: [facility]         Referring Provider: Marca Ancona  Today's Date: 04/23/22   STOP BANG RISK ASSESSMENT S (snore) Have you been told that you snore?     YES   T (tired) Are you often tired, fatigued, or sleepy during the day?   YES  O (obstruction) Do you stop breathing, choke, or gasp during sleep? YES   P (pressure) Do you have or are you being treated for high blood pressure? YES   B (BMI) Is your body index greater than 35 kg/m? YES   A (age) Are you 23 years old or older? NO   N (neck) Do you have a neck circumference greater than 16 inches?   NO   G (gender) Are you a male? YES   TOTAL STOP/BANG "YES" ANSWERS                                                                        For Office Use Only              Procedure Order Form    YES to 3+ Stop Bang questions OR two clinical symptoms - patient qualifies for WatchPAT (CPT 95800)             Clinical Notes: Will consult Sleep Specialist and refer for management of therapy due to patient increased risk of Sleep Apnea. Ordering a sleep study due to the following two clinical symptoms:Loud snoring R06.83 History of high blood pressure R03.0 /   I understand that I am proceeding with a home sleep apnea test as ordered by my treating physician. I understand that untreated sleep apnea is a serious cardiovascular risk factor and it is my responsibility to perform the test and seek management for sleep apnea. I will be contacted with the results and be managed for sleep apnea by a local sleep physician. I will be receiving equipment and further instructions from [facility] I shall promptly ship back the equipment via the included mailing label. I understand my insurance will be billed for the test and as the patient I am responsible for any insurance related out-of-pocket costs incurred. I have been provided  with written instructions and can call for additional video or telephonic instruction, with 24-hour availability of qualified personnel to answer any questions: Patient Help Desk [phone].  Patient Signature ______________________________________________________   Date______________________ Patient Telemedicine Verbal Consent
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
## 2022-04-23 NOTE — Patient Instructions (Signed)
It was great to see you today! No medication changes are needed at this time.  Keep follow up as scheduled with Dr Shirlee Latch   Do the following things EVERYDAY: Weigh yourself in the morning before breakfast. Write it down and keep it in a log. Take your medicines as prescribed Eat low salt foods--Limit salt (sodium) to 2000 mg per day.  Stay as active as you can everyday Limit all fluids for the day to less than 2 liters  At the [facility], you and your health needs are our priority. As part of our continuing mission to provide you with exceptional heart care, we have created designated Provider Care Teams. These Care Teams include your primary Cardiologist (physician) and Advanced Practice Providers (APPs- Physician Assistants and Nurse Practitioners) who all work together to provide you with the care you need, when you need it.   You may see any of the following providers on your designated Care Team at your next follow up: Dr Arvilla Meres Dr Carron Curie, NP Robbie Lis, [place] [facility] [place], [place] Karle Plumber, PharmD   Please be sure to bring in all your medications bottles to every appointment.   If you have any questions or concerns before your next appointment please send [place] a message through [place] or call our office at [phone].    TO LEAVE A MESSAGE FOR THE NURSE SELECT OPTION 2, PLEASE LEAVE A MESSAGE INCLUDING: YOUR NAME DATE OF BIRTH CALL BACK NUMBER REASON FOR CALL**this is important as we prioritize the call backs  YOU WILL RECEIVE A CALL BACK THE SAME DAY AS LONG AS YOU CALL BEFORE 4:00 PM

## 2022-04-29 ENCOUNTER — Other Ambulatory Visit: Payer: Self-pay | Admitting: Orthopedic Surgery

## 2022-04-30 ENCOUNTER — Other Ambulatory Visit (HOSPITAL_COMMUNITY): Payer: Self-pay | Admitting: Cardiology

## 2022-04-30 ENCOUNTER — Other Ambulatory Visit (HOSPITAL_COMMUNITY): Payer: Self-pay

## 2022-04-30 MED ORDER — GABAPENTIN 300 MG PO CAPS
300.0000 mg | ORAL_CAPSULE | Freq: Three times a day (TID) | ORAL | 0 refills | Status: DC
  Filled 2022-04-30 – 2022-05-16 (×3): qty 90, 30d supply, fill #0

## 2022-04-30 MED ORDER — DIGOXIN 125 MCG PO TABS
0.1250 mg | ORAL_TABLET | Freq: Every day | ORAL | 3 refills | Status: DC
  Filled 2022-04-30 – 2022-05-16 (×2): qty 30, 30d supply, fill #0
  Filled 2022-06-10: qty 30, 30d supply, fill #1

## 2022-05-02 ENCOUNTER — Other Ambulatory Visit (HOSPITAL_COMMUNITY): Payer: Self-pay

## 2022-05-08 ENCOUNTER — Other Ambulatory Visit (HOSPITAL_COMMUNITY): Payer: Self-pay

## 2022-05-16 ENCOUNTER — Other Ambulatory Visit (HOSPITAL_COMMUNITY): Payer: Self-pay

## 2022-05-16 ENCOUNTER — Other Ambulatory Visit (HOSPITAL_COMMUNITY): Payer: Self-pay | Admitting: Cardiology

## 2022-05-16 MED ORDER — SPIRONOLACTONE 25 MG PO TABS
25.0000 mg | ORAL_TABLET | Freq: Every day | ORAL | 2 refills | Status: DC
  Filled 2022-05-16: qty 30, 30d supply, fill #0
  Filled 2022-06-10: qty 30, 30d supply, fill #1

## 2022-05-17 NOTE — Telephone Encounter (Signed)
[facility] Patient Advocate Encounter  Called to follow up with patient regarding income attestation letter. He has not called and requested one. Patient states that he will call and request the letter be sent to him.   Archer Asa, CPhT

## 2022-05-29 ENCOUNTER — Ambulatory Visit: Payer: Self-pay | Attending: Family Medicine | Admitting: Cardiology

## 2022-05-29 DIAGNOSIS — I493 Ventricular premature depolarization: Secondary | ICD-10-CM | POA: Insufficient documentation

## 2022-05-29 DIAGNOSIS — G4761 Periodic limb movement disorder: Secondary | ICD-10-CM | POA: Insufficient documentation

## 2022-05-29 DIAGNOSIS — I5022 Chronic systolic (congestive) heart failure: Secondary | ICD-10-CM | POA: Insufficient documentation

## 2022-05-29 DIAGNOSIS — R0683 Snoring: Secondary | ICD-10-CM | POA: Insufficient documentation

## 2022-05-29 DIAGNOSIS — G4733 Obstructive sleep apnea (adult) (pediatric): Secondary | ICD-10-CM | POA: Insufficient documentation

## 2022-06-01 NOTE — Procedures (Signed)
   Patient Name: [name], Dock Date:05/29/2022 Gender: Male D.O.B: [birth year] Age (years): 74 Referring Provider: Prince Rome FNP Height (inches): 68 Interpreting Physician: Armanda Magic MD, ABSM Weight (lbs): 230 RPSGT: Alfonso Ellis BMI: 35 MRN: [id number] Neck Size: 9.00  CLINICAL INFORMATION Sleep Study Type: NPSG  Indication for sleep study: N/A  Epworth Sleepiness Score: 8  SLEEP STUDY TECHNIQUE As per the AASM Manual for the Scoring of Sleep and Associated Events v2.3 (April 2016) with a hypopnea requiring 4% desaturations.  The channels recorded and monitored were frontal, central and occipital EEG, electrooculogram (EOG), submentalis EMG (chin), nasal and oral airflow, thoracic and abdominal wall motion, anterior tibialis EMG, snore microphone, electrocardiogram, and pulse oximetry.  MEDICATIONS Medications self-administered by patient taken the night of the study : N/A  SLEEP ARCHITECTURE The study was initiated at 10:27:23 PM and ended at 4:54:26 AM.  Sleep onset time was 26.8 minutes and the sleep efficiency was 90.2%. The total sleep time was 349.3 minutes.  Stage REM latency was 159.0 minutes.  The patient spent 5.44% of the night in stage N1 sleep, 62.35% in stage N2 sleep, 21.33% in stage N3 and 10.9% in REM.  Alpha intrusion was absent.  Supine sleep was 59.40%.  RESPIRATORY PARAMETERS The overall apnea/hypopnea index (AHI) was 10.7 per hour. There were 1 total apneas, including 0 obstructive, 1 central and 0 mixed apneas. There were 61 hypopneas and 0 RERAs.  The AHI during Stage REM sleep was 25.3 per hour.  AHI while supine was 15.3 per hour.  The mean oxygen saturation was 92.56%. The minimum SpO2 during sleep was 84.00%.  moderate snoring was noted during this study.  CARDIAC DATA The 2 lead EKG demonstrated sinus rhythm. The mean heart rate was 19.37 beats per minute. Other EKG findings include: PVCs.  LEG MOVEMENT DATA The  total PLMS were 133 with a resulting PLMS index of 22.85. Associated arousal with leg movement index was 2.6 .  IMPRESSIONS - Mild obstructive sleep apnea occurred during this study (AHI = 10.7/h). - No significant central sleep apnea occurred during this study (CAI = 0.2/h). - Mild oxygen desaturation was noted during this study (Min O2 = 84.00%). - The patient snored with moderate snoring volume. - EKG findings include PVCs. - Mild periodic limb movements of sleep occurred during the study. No significant associated arousals.  DIAGNOSIS - Obstructive Sleep Apnea (G47.33)  RECOMMENDATIONS - Therapeutic CPAP titration to determine optimal pressure required to alleviate sleep disordered breathing. - Positional therapy avoiding supine position during sleep. - Avoid alcohol, sedatives and other CNS depressants that may worsen sleep apnea and disrupt normal sleep architecture. - Sleep hygiene should be reviewed to assess factors that may improve sleep quality. - Weight management and regular exercise should be initiated or continued if appropriate.  [Electronically signed] 06/01/2022 08:56 AM  Armanda Magic MD, ABSM Diplomate, American Board of Sleep Medicine

## 2022-06-11 ENCOUNTER — Ambulatory Visit (HOSPITAL_COMMUNITY)
Admission: RE | Admit: 2022-06-11 | Discharge: 2022-06-11 | Disposition: A | Discharge status: Home / Self Care | Payer: Medicaid Other | Source: Ambulatory Visit | Attending: Cardiology | Admitting: Cardiology

## 2022-06-11 ENCOUNTER — Other Ambulatory Visit (HOSPITAL_COMMUNITY): Payer: Self-pay

## 2022-06-11 ENCOUNTER — Encounter (HOSPITAL_COMMUNITY): Payer: Self-pay | Admitting: Cardiology

## 2022-06-11 VITALS — BP 140/88 | HR 67 | Wt 229.0 lb

## 2022-06-11 DIAGNOSIS — E785 Hyperlipidemia, unspecified: Secondary | ICD-10-CM | POA: Insufficient documentation

## 2022-06-11 DIAGNOSIS — Z7902 Long term (current) use of antithrombotics/antiplatelets: Secondary | ICD-10-CM | POA: Insufficient documentation

## 2022-06-11 DIAGNOSIS — Z7984 Long term (current) use of oral hypoglycemic drugs: Secondary | ICD-10-CM | POA: Insufficient documentation

## 2022-06-11 DIAGNOSIS — Z6836 Body mass index (BMI) 36.0-36.9, adult: Secondary | ICD-10-CM | POA: Insufficient documentation

## 2022-06-11 DIAGNOSIS — Z7901 Long term (current) use of anticoagulants: Secondary | ICD-10-CM | POA: Insufficient documentation

## 2022-06-11 DIAGNOSIS — Z79899 Other long term (current) drug therapy: Secondary | ICD-10-CM | POA: Insufficient documentation

## 2022-06-11 DIAGNOSIS — I252 Old myocardial infarction: Secondary | ICD-10-CM | POA: Insufficient documentation

## 2022-06-11 DIAGNOSIS — I255 Ischemic cardiomyopathy: Secondary | ICD-10-CM | POA: Insufficient documentation

## 2022-06-11 DIAGNOSIS — I5022 Chronic systolic (congestive) heart failure: Secondary | ICD-10-CM | POA: Insufficient documentation

## 2022-06-11 DIAGNOSIS — R0600 Dyspnea, unspecified: Secondary | ICD-10-CM | POA: Insufficient documentation

## 2022-06-11 DIAGNOSIS — I4589 Other specified conduction disorders: Secondary | ICD-10-CM | POA: Insufficient documentation

## 2022-06-11 DIAGNOSIS — Z951 Presence of aortocoronary bypass graft: Secondary | ICD-10-CM | POA: Insufficient documentation

## 2022-06-11 DIAGNOSIS — I2510 Atherosclerotic heart disease of native coronary artery without angina pectoris: Secondary | ICD-10-CM | POA: Insufficient documentation

## 2022-06-11 DIAGNOSIS — E669 Obesity, unspecified: Secondary | ICD-10-CM | POA: Insufficient documentation

## 2022-06-11 DIAGNOSIS — I110 Hypertensive heart disease with heart failure: Secondary | ICD-10-CM | POA: Insufficient documentation

## 2022-06-11 LAB — BASIC METABOLIC PANEL
Anion gap: 10 (ref 5–15)
BUN: 9 mg/dL (ref 6–20)
CO2: 17 mmol/L — ABNORMAL LOW (ref 22–32)
Calcium: 9.5 mg/dL (ref 8.9–10.3)
Chloride: 111 mmol/L (ref 98–111)
Creatinine, Ser: 0.89 mg/dL (ref 0.61–1.24)
GFR, Estimated: >60 mL/min (ref >60)
Glucose, Bld: 99 mg/dL (ref 70–99)
Potassium: 4 mmol/L (ref 3.5–5.1)
Sodium: 138 mmol/L (ref 135–145)

## 2022-06-11 LAB — LIPID PANEL
Cholesterol: 103 mg/dL (ref 0–200)
HDL: 41 mg/dL (ref >40)
LDL Cholesterol: 35 mg/dL (ref 0–99)
Total CHOL/HDL Ratio: 2.5 RATIO
Triglycerides: 135 mg/dL (ref <150)
VLDL: 27 mg/dL (ref 0–40)

## 2022-06-11 LAB — DIGOXIN LEVEL: Digoxin Level: 0.4 ng/mL — ABNORMAL LOW (ref 0.8–2.0)

## 2022-06-11 MED ORDER — ENTRESTO 49-51 MG PO TABS: 1.0000 | ORAL_TABLET | Freq: Two times a day (BID) | ORAL | 3 refills | Status: DC
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
## 2022-06-11 NOTE — Patient Instructions (Signed)
Medication Changes:  INCREASE Entrestro to 49/51 mg Twice daily   Lab Work:  Labs done today, your results will be available in MyChart, we will contact you for abnormal readings.  Your physician recommends that you return for lab work in: 10-14 days, we have provided you a prescription to have this done locally  Testing/Procedures:  Your physician has requested that you have an echocardiogram. Echocardiography is a painless test that uses sound waves to create images of your heart. It provides your doctor with information about the size and shape of your heart and how well your heart's chambers and valves are working. This procedure takes approximately one hour. There are no restrictions for this procedure.  Referrals:  none  Special Instructions // Education:  Do the following things EVERYDAY: Weigh yourself in the morning before breakfast. Write it down and keep it in a log. Take your medicines as prescribed Eat low salt foods--Limit salt (sodium) to 2000 mg per day.  Stay as active as you can everyday Limit all fluids for the day to less than 2 liters  **CALL [employer] AT [phone] FOR INCOME VERFICATION/ATTESTATION FORM  Follow-Up in: 2 MONTHS  At the [facility], you and your health needs are our priority. We have a designated team specialized in the treatment of Heart Failure. This Care Team includes your primary Heart Failure Specialized Cardiologist (physician), Advanced Practice Providers (APPs- Physician Assistants and Nurse Practitioners), and Pharmacist who all work together to provide you with the care you need, when you need it.   You may see any of the following providers on your designated Care Team at your next follow up:  Dr Arvilla Meres Dr Carron Curie, NP Robbie Lis, [place] [facility] [place], [place] Karle Plumber, PharmD   Please be sure to bring in all your medications bottles to every appointment.    Need to Contact us:  If you have any questions or concerns before your next appointment please send [place] a message through [place] or call our office at [phone].    TO LEAVE A MESSAGE FOR THE NURSE SELECT OPTION 2, PLEASE LEAVE A MESSAGE INCLUDING: YOUR NAME DATE OF BIRTH CALL BACK NUMBER REASON FOR CALL**this is important as we prioritize the call backs  YOU WILL RECEIVE A CALL BACK THE SAME DAY AS LONG AS YOU CALL BEFORE 4:00 PM

## 2022-06-11 NOTE — Progress Notes (Signed)
Medication Samples have been provided to the patient.  Drug name: Sherryll Burger       Strength: 49/51 mg        Qty: 2  LOT: [id number]  Exp.Date: 04/2024  Dosing instructions: Take 1 tablet Twice daily   The patient has been instructed regarding the correct time, dose, and frequency of taking this medication, including desired effects and most common side effects.   [name] 12:09 PM 06/11/2022

## 2022-06-19 ENCOUNTER — Other Ambulatory Visit (HOSPITAL_COMMUNITY): Payer: Self-pay

## 2022-06-19 DIAGNOSIS — I5022 Chronic systolic (congestive) heart failure: Secondary | ICD-10-CM

## 2022-06-19 NOTE — Progress Notes (Signed)
Orders Placed This Encounter  Procedures   ECHOCARDIOGRAM COMPLETE    Standing Status:   Future    Standing Expiration Date:   06/20/2023    Order Specific Question:   Where should this test be performed    Answer:   [place]    Order Specific Question:   Perflutren DEFINITY (image enhancing agent) should be administered unless hypersensitivity or allergy exist    Answer:   Administer Perflutren    Order Specific Question:   Reason for exam-Echo    Answer:   Congestive Heart Failure  I50.9    Order Specific Question:   Release to patient    Answer:   Immediate    
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
## 2022-06-22 ENCOUNTER — Ambulatory Visit (HOSPITAL_COMMUNITY)
Admission: RE | Admit: 2022-06-22 | Discharge: 2022-06-22 | Disposition: A | Discharge status: Home / Self Care | Payer: Medicaid Other | Source: Ambulatory Visit | Attending: Physician Assistant | Admitting: Physician Assistant

## 2022-06-22 DIAGNOSIS — I5022 Chronic systolic (congestive) heart failure: Secondary | ICD-10-CM | POA: Insufficient documentation

## 2022-06-22 MED ORDER — PERFLUTREN LIPID MICROSPHERE
1.0000 mL | INTRAVENOUS | Status: AC | PRN
  Administered 2022-06-22: 4 mL via INTRAVENOUS

## 2022-06-26 LAB — ECHOCARDIOGRAM COMPLETE
AR max vel: 2.98 cm2
AV Peak grad: 6.7 mmHg
Ao pk vel: 1.29 m/s
Area-P 1/2: 3.76 cm2
S' Lateral: 5 cm

## 2022-06-27 ENCOUNTER — Encounter (HOSPITAL_COMMUNITY): Payer: Medicaid Other | Admitting: Cardiology

## 2022-06-27 ENCOUNTER — Telehealth: Payer: Self-pay | Admitting: *Deleted

## 2022-06-27 DIAGNOSIS — R29818 Other symptoms and signs involving the nervous system: Secondary | ICD-10-CM

## 2022-06-27 DIAGNOSIS — G4733 Obstructive sleep apnea (adult) (pediatric): Secondary | ICD-10-CM

## 2022-06-27 DIAGNOSIS — I2510 Atherosclerotic heart disease of native coronary artery without angina pectoris: Secondary | ICD-10-CM

## 2022-06-27 NOTE — Telephone Encounter (Signed)
The patient has been notified of the result and verbalized understanding.  All questions (if any) were answered. Latrelle Dodrill, CMA 06/27/2022 10:48 AM    Precert Cpap titration

## 2022-06-27 NOTE — Telephone Encounter (Signed)
-----   Message from Gaynelle Cage, [place] sent at 06/04/2022  8:16 AM EDT -----  ----- Message ----- From: Quintella Reichert, MD Sent: 06/01/2022   8:58 AM EDT To: Cv Div Sleep Studies  Please let patient know that they have sleep apnea.  Recommend therapeutic CPAP titration for treatment of patient's sleep disordered breathing.  If unable to perform an in lab titration then initiate ResMed auto CPAP from 4 to 15cm H2O with heated humidity and mask of choice and overnight pulse ox on CPAP.

## 2022-07-02 ENCOUNTER — Telehealth (HOSPITAL_COMMUNITY): Payer: Self-pay

## 2022-07-02 ENCOUNTER — Ambulatory Visit (INDEPENDENT_AMBULATORY_CARE_PROVIDER_SITE_OTHER): Payer: Self-pay

## 2022-07-02 ENCOUNTER — Ambulatory Visit (HOSPITAL_COMMUNITY)
Admission: RE | Admit: 2022-07-02 | Discharge: 2022-07-02 | Disposition: A | Discharge status: Home / Self Care | Payer: Medicaid Other | Source: Ambulatory Visit | Attending: Cardiology | Admitting: Cardiology

## 2022-07-02 ENCOUNTER — Other Ambulatory Visit (HOSPITAL_COMMUNITY): Payer: Self-pay

## 2022-07-02 ENCOUNTER — Telehealth (HOSPITAL_COMMUNITY): Payer: Self-pay | Admitting: Pharmacist

## 2022-07-02 VITALS — BP 160/104 | HR 67 | Wt 235.8 lb

## 2022-07-02 DIAGNOSIS — Z79899 Other long term (current) drug therapy: Secondary | ICD-10-CM | POA: Insufficient documentation

## 2022-07-02 DIAGNOSIS — I252 Old myocardial infarction: Secondary | ICD-10-CM | POA: Insufficient documentation

## 2022-07-02 DIAGNOSIS — I255 Ischemic cardiomyopathy: Secondary | ICD-10-CM | POA: Insufficient documentation

## 2022-07-02 DIAGNOSIS — I5022 Chronic systolic (congestive) heart failure: Secondary | ICD-10-CM | POA: Insufficient documentation

## 2022-07-02 DIAGNOSIS — Z7984 Long term (current) use of oral hypoglycemic drugs: Secondary | ICD-10-CM | POA: Insufficient documentation

## 2022-07-02 DIAGNOSIS — Z7902 Long term (current) use of antithrombotics/antiplatelets: Secondary | ICD-10-CM | POA: Insufficient documentation

## 2022-07-02 DIAGNOSIS — R5383 Other fatigue: Secondary | ICD-10-CM | POA: Insufficient documentation

## 2022-07-02 DIAGNOSIS — Z951 Presence of aortocoronary bypass graft: Secondary | ICD-10-CM | POA: Insufficient documentation

## 2022-07-02 DIAGNOSIS — G4733 Obstructive sleep apnea (adult) (pediatric): Secondary | ICD-10-CM | POA: Insufficient documentation

## 2022-07-02 DIAGNOSIS — Z7901 Long term (current) use of anticoagulants: Secondary | ICD-10-CM | POA: Insufficient documentation

## 2022-07-02 DIAGNOSIS — I2510 Atherosclerotic heart disease of native coronary artery without angina pectoris: Secondary | ICD-10-CM | POA: Insufficient documentation

## 2022-07-02 LAB — BASIC METABOLIC PANEL
Anion gap: 6 (ref 5–15)
BUN: <5 mg/dL — ABNORMAL LOW (ref 6–20)
CO2: 24 mmol/L (ref 22–32)
Calcium: 8.6 mg/dL — ABNORMAL LOW (ref 8.9–10.3)
Chloride: 108 mmol/L (ref 98–111)
Creatinine, Ser: 0.82 mg/dL (ref 0.61–1.24)
GFR, Estimated: >60 mL/min (ref >60)
Glucose, Bld: 81 mg/dL (ref 70–99)
Potassium: 3.5 mmol/L (ref 3.5–5.1)
Sodium: 138 mmol/L (ref 135–145)

## 2022-07-02 MED ORDER — TORSEMIDE 20 MG PO TABS
20.0000 mg | ORAL_TABLET | Freq: Every day | ORAL | 11 refills | Status: DC
  Filled 2022-07-02: qty 30, 30d supply, fill #0

## 2022-07-02 MED ORDER — SACUBITRIL-VALSARTAN 97-103 MG PO TABS: 1.0000 | ORAL_TABLET | Freq: Two times a day (BID) | ORAL | 3 refills | Status: DC

## 2022-07-02 NOTE — Patient Instructions (Signed)
It was a pleasure seeing you today!  MEDICATIONS: -We are changing your medications today -Increase Entresto to 97/103 mg (1 tablet) twice daily. You may take 2 tablets of the 49/51 mg strength twice daily until you pick up the new strength.  - Please call [employer] at  [phone] to request a "No Income Attestation" letter so your application for assistance can be approved. -Call if you have questions about your medications.  LABS: -We will call you if your labs need attention.  NEXT APPOINTMENT: Return to clinic in 4 weeks with [facility].  In general, to take care of your heart failure: -Limit your fluid intake to 2 Liters (half-gallon) per day.   -Limit your salt intake to ideally 2-3 grams (2000-3000 mg) per day. -Weigh yourself daily and record, and bring that "weight diary" to your next appointment.  (Weight gain of 2-3 pounds in 1 day typically means fluid weight.) -The medications for your heart are to help your heart and help you live longer.   -Please contact us before stopping any of your heart medications.  Call the clinic at [phone] with questions or to reschedule future appointments.

## 2022-07-02 NOTE — Telephone Encounter (Signed)
Advanced Heart Failure Patient Advocate Encounter  Received notification that [place] assistance is due for renewal. Patient does not have any active insurance coverage at this time. Started [place] forms, patient will be in office today. Will submit application once forms are completed.  Clista Bernhardt, CPhT Rx Patient Advocate Phone: [phone]

## 2022-07-02 NOTE — Telephone Encounter (Signed)
Medication Samples have been provided to the patient.   Drug name: Delene Loll     Strength: 49/51 mg      Qty: 4 bottles                 LOT: [id number] Exp.Date: 07/2024   Dosing instructions: 2 tablets BID  He needs to call [employer] and request "No Income Best boy. Patient assures me he will call today.    The patient has been instructed regarding the correct time, dose, and frequency of taking this medication, including desired effects and most common side effects.   Audry Riles, PharmD, BCPS, CPP Heart Failure Clinic Pharmacist [phone]

## 2022-07-03 LAB — CUP PACEART REMOTE DEVICE CHECK
Battery Remaining Longevity: 168 mo
Battery Remaining Percentage: 100 %
Brady Statistic RV Percent Paced: 0 %
Date Time Interrogation Session: 20230918000000
HighPow Impedance: 76 Ohm
Implantable Lead Implant Date: 20220909
Implantable Lead Location: 753860
Implantable Lead Model: 137
Implantable Lead Serial Number: 301248
Implantable Pulse Generator Implant Date: 20220909
Lead Channel Impedance Value: 610 Ohm
Lead Channel Pacing Threshold Amplitude: 1 V
Lead Channel Pacing Threshold Pulse Width: 0.4 ms
Lead Channel Setting Pacing Amplitude: 3.5 V
Lead Channel Setting Pacing Pulse Width: 0.4 ms
Lead Channel Setting Sensing Sensitivity: 0.6 mV
Pulse Gen Serial Number: 213174

## 2022-07-13 NOTE — Progress Notes (Signed)
Remote ICD transmission.   

## 2022-07-16 NOTE — Telephone Encounter (Addendum)
Advanced Heart Failure Patient Advocate Encounter  Sent in application via fax, 28/11. Document scanned to chart.   Will follow up.

## 2022-07-18 NOTE — Telephone Encounter (Signed)
Advanced Heart Failure Patient Advocate Encounter   Patient was approved to receive Entresto from [employer]  Effective dates: 07/17/22 through 07/18/23  Document scanned to chart.   Charlann Boxer, CPhT

## 2022-07-19 ENCOUNTER — Telehealth (HOSPITAL_COMMUNITY): Payer: Self-pay

## 2022-07-19 NOTE — Telephone Encounter (Signed)
Received a fax requesting medical records from [facility]. Records were successfully faxed to: [phone] ,which was the number provided.. Medical request form will be scanned into patients chart.

## 2022-07-23 NOTE — Telephone Encounter (Signed)
Advanced Heart Failure Patient Advocate Encounter  Contacted [place] for renewal status/update. Confirmed Medicaid ineligibility letter was received with the application. Representative stated that the request is being processed, and is now being escalated for review. Will call back if determination letter is not received within 2-3 business days.  Clista Bernhardt, CPhT Rx Patient Advocate Phone: [phone]

## 2022-07-23 NOTE — Telephone Encounter (Signed)
Advanced Heart Failure Patient Advocate Encounter   Patient was approved to receive Farxiga from [place]. Effective dates: 07/23/2022 through 07/24/2023. Determination letter has been added to patient chart.  Contacted patient to expect call to set up delivery.  Clista Bernhardt, CPhT Rx Patient Advocate Phone: [phone]

## 2022-08-03 NOTE — Telephone Encounter (Addendum)
Message sent to provider to inform her that the patient's insurance does not that pay for any sleep studies,cpap machines ect. He already had the NPSG. I offered the patient assistance program to him but pt states he has no type income coming in to pay the one time $100 fee or the monthly rental if he were to get a cpap machine.Marland Kitchen He can't afford to pay anything.

## 2022-08-10 ENCOUNTER — Ambulatory Visit (HOSPITAL_COMMUNITY)
Admission: RE | Admit: 2022-08-10 | Discharge: 2022-08-10 | Disposition: A | Discharge status: Home / Self Care | Payer: Medicaid Other | Source: Ambulatory Visit | Attending: Family Medicine | Admitting: Family Medicine

## 2022-08-10 ENCOUNTER — Encounter (HOSPITAL_COMMUNITY): Payer: Self-pay

## 2022-08-10 VITALS — BP 112/78 | HR 70 | Wt 248.6 lb

## 2022-08-10 DIAGNOSIS — E669 Obesity, unspecified: Secondary | ICD-10-CM | POA: Insufficient documentation

## 2022-08-10 DIAGNOSIS — I110 Hypertensive heart disease with heart failure: Secondary | ICD-10-CM | POA: Insufficient documentation

## 2022-08-10 DIAGNOSIS — I5023 Acute on chronic systolic (congestive) heart failure: Secondary | ICD-10-CM | POA: Insufficient documentation

## 2022-08-10 DIAGNOSIS — Z79899 Other long term (current) drug therapy: Secondary | ICD-10-CM | POA: Insufficient documentation

## 2022-08-10 DIAGNOSIS — I5022 Chronic systolic (congestive) heart failure: Secondary | ICD-10-CM

## 2022-08-10 DIAGNOSIS — I4589 Other specified conduction disorders: Secondary | ICD-10-CM | POA: Insufficient documentation

## 2022-08-10 DIAGNOSIS — I2510 Atherosclerotic heart disease of native coronary artery without angina pectoris: Secondary | ICD-10-CM | POA: Insufficient documentation

## 2022-08-10 DIAGNOSIS — Z955 Presence of coronary angioplasty implant and graft: Secondary | ICD-10-CM | POA: Insufficient documentation

## 2022-08-10 DIAGNOSIS — I252 Old myocardial infarction: Secondary | ICD-10-CM | POA: Insufficient documentation

## 2022-08-10 DIAGNOSIS — Z8719 Personal history of other diseases of the digestive system: Secondary | ICD-10-CM | POA: Insufficient documentation

## 2022-08-10 DIAGNOSIS — I255 Ischemic cardiomyopathy: Secondary | ICD-10-CM | POA: Insufficient documentation

## 2022-08-10 DIAGNOSIS — Z7902 Long term (current) use of antithrombotics/antiplatelets: Secondary | ICD-10-CM | POA: Insufficient documentation

## 2022-08-10 DIAGNOSIS — G4733 Obstructive sleep apnea (adult) (pediatric): Secondary | ICD-10-CM | POA: Insufficient documentation

## 2022-08-10 DIAGNOSIS — Z951 Presence of aortocoronary bypass graft: Secondary | ICD-10-CM | POA: Insufficient documentation

## 2022-08-10 LAB — BASIC METABOLIC PANEL
Anion gap: 8 (ref 5–15)
BUN: 10 mg/dL (ref 6–20)
CO2: 21 mmol/L — ABNORMAL LOW (ref 22–32)
Calcium: 9 mg/dL (ref 8.9–10.3)
Chloride: 107 mmol/L (ref 98–111)
Creatinine, Ser: 0.82 mg/dL (ref 0.61–1.24)
GFR, Estimated: >60 mL/min (ref >60)
Glucose, Bld: 102 mg/dL — ABNORMAL HIGH (ref 70–99)
Potassium: 4.2 mmol/L (ref 3.5–5.1)
Sodium: 136 mmol/L (ref 135–145)

## 2022-08-10 LAB — BRAIN NATRIURETIC PEPTIDE: B Natriuretic Peptide: 16.8 pg/mL (ref 0.0–100.0)

## 2022-08-10 LAB — DIGOXIN LEVEL: Digoxin Level: 0.3 ng/mL — ABNORMAL LOW (ref 0.8–2.0)

## 2022-08-10 LAB — MAGNESIUM: Magnesium: 2.1 mg/dL (ref 1.7–2.4)

## 2022-08-10 NOTE — Addendum Note (Signed)
Encounter addended by: Payton Mccallum, RN on: 08/10/2022 1:58 PM  Actions taken: Clinical Note Signed

## 2022-08-10 NOTE — Progress Notes (Signed)
ReDS Vest / Clip - 08/10/22 1000       ReDS Vest / Clip   Station Marker D    Ruler Value 33    ReDS Value Range High volume overload    ReDS Actual Value 51

## 2022-08-10 NOTE — Patient Instructions (Addendum)
Thank you for coming in today  Labs were done today, if any labs are abnormal the clinic will call you No news is good news  HOLD Torsemide 08/10/2022 and 10/28/203 START Furoscix for 2 days 08/10/2022 and 08/11/2022  Your physician recommends that you schedule a follow-up appointment in:  1-2 weeks in clinic  4 months with Dr. Aundra Dubin     Do the following things EVERYDAY: Weigh yourself in the morning before breakfast. Write it down and keep it in a log. Take your medicines as prescribed Eat low salt foods--Limit salt (sodium) to 2000 mg per day.  Stay as active as you can everyday Limit all fluids for the day to less than 2 liters  At the [facility], you and your health needs are our priority. As part of our continuing mission to provide you with exceptional heart care, we have created designated Provider Care Teams. These Care Teams include your primary Cardiologist (physician) and Advanced Practice Providers (APPs- Physician Assistants and Nurse Practitioners) who all work together to provide you with the care you need, when you need it.   You may see any of the following providers on your designated Care Team at your next follow up: Dr Glori Bickers Dr Loralie Champagne Dr. Roxana Hires, NP Lyda Jester, [place] [facility] [place], [place] Forestine Na, NP Audry Riles, PharmD   Please be sure to bring in all your medications bottles to every appointment.    If you have any questions or concerns before your next appointment please send [place] a message through [place] or call our office at [phone].    TO LEAVE A MESSAGE FOR THE NURSE SELECT OPTION 2, PLEASE LEAVE A MESSAGE INCLUDING: YOUR NAME DATE OF BIRTH CALL BACK NUMBER REASON FOR CALL**this is important as we prioritize the call backs  YOU WILL RECEIVE A CALL BACK THE SAME DAY AS LONG AS YOU CALL BEFORE 4:00 PM
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
## 2022-08-10 NOTE — Progress Notes (Addendum)
Pt was given 2 samples of Furoscix sample in office pt was educated and watched video of how to administer medication at home. Pt and significant other both acknowledged, verbalized understanding of information.  LOT # [id number] Exp. Date 12/13/2023

## 2022-08-16 NOTE — Addendum Note (Signed)
Addended by: Freada Bergeron on: 08/16/2022 10:40 AM   Modules accepted: Orders

## 2022-08-16 NOTE — Telephone Encounter (Signed)
I will send the cpap assistance program application with the prescription into the assistance program for the patient as requested by Tammy Sours.

## 2022-08-21 ENCOUNTER — Other Ambulatory Visit: Payer: Self-pay | Admitting: Orthopedic Surgery

## 2022-08-21 ENCOUNTER — Other Ambulatory Visit (HOSPITAL_COMMUNITY): Payer: Self-pay

## 2022-08-21 ENCOUNTER — Telehealth (HOSPITAL_COMMUNITY): Payer: Self-pay

## 2022-08-21 ENCOUNTER — Ambulatory Visit (HOSPITAL_COMMUNITY)
Admission: RE | Admit: 2022-08-21 | Discharge: 2022-08-21 | Disposition: A | Discharge status: Home / Self Care | Payer: Medicaid Other | Source: Ambulatory Visit | Attending: Adult Health | Admitting: Adult Health

## 2022-08-21 VITALS — BP 180/100 | HR 84 | Wt 248.2 lb

## 2022-08-21 DIAGNOSIS — Z6841 Body Mass Index (BMI) 40.0 and over, adult: Secondary | ICD-10-CM | POA: Insufficient documentation

## 2022-08-21 DIAGNOSIS — Z87891 Personal history of nicotine dependence: Secondary | ICD-10-CM | POA: Insufficient documentation

## 2022-08-21 DIAGNOSIS — E669 Obesity, unspecified: Secondary | ICD-10-CM | POA: Insufficient documentation

## 2022-08-21 DIAGNOSIS — I110 Hypertensive heart disease with heart failure: Secondary | ICD-10-CM | POA: Insufficient documentation

## 2022-08-21 DIAGNOSIS — G4733 Obstructive sleep apnea (adult) (pediatric): Secondary | ICD-10-CM | POA: Insufficient documentation

## 2022-08-21 DIAGNOSIS — Z951 Presence of aortocoronary bypass graft: Secondary | ICD-10-CM | POA: Insufficient documentation

## 2022-08-21 DIAGNOSIS — Z955 Presence of coronary angioplasty implant and graft: Secondary | ICD-10-CM | POA: Insufficient documentation

## 2022-08-21 DIAGNOSIS — I2510 Atherosclerotic heart disease of native coronary artery without angina pectoris: Secondary | ICD-10-CM | POA: Insufficient documentation

## 2022-08-21 DIAGNOSIS — I255 Ischemic cardiomyopathy: Secondary | ICD-10-CM | POA: Insufficient documentation

## 2022-08-21 DIAGNOSIS — E785 Hyperlipidemia, unspecified: Secondary | ICD-10-CM

## 2022-08-21 DIAGNOSIS — Z95810 Presence of automatic (implantable) cardiac defibrillator: Secondary | ICD-10-CM | POA: Insufficient documentation

## 2022-08-21 DIAGNOSIS — I252 Old myocardial infarction: Secondary | ICD-10-CM | POA: Insufficient documentation

## 2022-08-21 DIAGNOSIS — I5022 Chronic systolic (congestive) heart failure: Secondary | ICD-10-CM | POA: Insufficient documentation

## 2022-08-21 DIAGNOSIS — Z7902 Long term (current) use of antithrombotics/antiplatelets: Secondary | ICD-10-CM | POA: Insufficient documentation

## 2022-08-21 DIAGNOSIS — Z79899 Other long term (current) drug therapy: Secondary | ICD-10-CM | POA: Insufficient documentation

## 2022-08-21 LAB — BASIC METABOLIC PANEL
Anion gap: 11 (ref 5–15)
BUN: 17 mg/dL (ref 6–20)
CO2: 22 mmol/L (ref 22–32)
Calcium: 9.5 mg/dL (ref 8.9–10.3)
Chloride: 104 mmol/L (ref 98–111)
Creatinine, Ser: 0.97 mg/dL (ref 0.61–1.24)
GFR, Estimated: >60 mL/min (ref >60)
Glucose, Bld: 106 mg/dL — ABNORMAL HIGH (ref 70–99)
Potassium: 3.4 mmol/L — ABNORMAL LOW (ref 3.5–5.1)
Sodium: 137 mmol/L (ref 135–145)

## 2022-08-21 MED ORDER — ROSUVASTATIN CALCIUM 40 MG PO TABS
40.0000 mg | ORAL_TABLET | Freq: Every day | ORAL | 3 refills | Status: DC
  Filled 2022-08-21: qty 30, 30d supply, fill #0
  Filled 2022-09-12: qty 30, 30d supply, fill #1
  Filled 2022-10-15: qty 30, 30d supply, fill #2
  Filled 2022-11-20: qty 30, 30d supply, fill #3
  Filled 2022-12-31 – 2023-01-23 (×2): qty 30, 30d supply, fill #4
  Filled 2023-02-27: qty 30, 30d supply, fill #5
  Filled 2023-05-10 – 2023-07-10 (×2): qty 30, 30d supply, fill #6
  Filled 2023-08-08: qty 30, 30d supply, fill #7

## 2022-08-21 MED ORDER — POTASSIUM CHLORIDE CRYS ER 20 MEQ PO TBCR
20.0000 meq | EXTENDED_RELEASE_TABLET | Freq: Two times a day (BID) | ORAL | 3 refills | Status: DC
  Filled 2022-08-21: qty 180, 90d supply, fill #0

## 2022-08-21 MED ORDER — GABAPENTIN 300 MG PO CAPS
300.0000 mg | ORAL_CAPSULE | Freq: Three times a day (TID) | ORAL | 0 refills | Status: DC
  Filled 2022-08-21: qty 90, 30d supply, fill #0

## 2022-08-21 MED ORDER — EZETIMIBE 10 MG PO TABS
10.0000 mg | ORAL_TABLET | Freq: Every day | ORAL | 0 refills | Status: DC
  Filled 2022-08-21: qty 30, 30d supply, fill #0
  Filled 2022-09-12: qty 30, 30d supply, fill #1
  Filled 2022-10-15: qty 30, 30d supply, fill #2

## 2022-08-21 MED ORDER — DIGOXIN 125 MCG PO TABS
0.1250 mg | ORAL_TABLET | Freq: Every day | ORAL | 3 refills | Status: DC
  Filled 2022-08-21: qty 30, 30d supply, fill #0
  Filled 2022-09-12: qty 30, 30d supply, fill #1
  Filled 2022-10-15: qty 30, 30d supply, fill #2
  Filled 2022-11-20: qty 30, 30d supply, fill #3
  Filled 2022-12-31 – 2023-01-23 (×2): qty 30, 30d supply, fill #4
  Filled 2023-02-27: qty 30, 30d supply, fill #5
  Filled 2023-05-10 – 2023-07-10 (×2): qty 30, 30d supply, fill #6
  Filled 2023-08-08: qty 30, 30d supply, fill #7

## 2022-08-21 MED ORDER — CLOPIDOGREL BISULFATE 75 MG PO TABS
75.0000 mg | ORAL_TABLET | Freq: Every day | ORAL | 0 refills | Status: DC
  Filled 2022-08-21: qty 30, 30d supply, fill #0
  Filled 2022-09-12: qty 30, 30d supply, fill #1
  Filled 2022-10-15: qty 30, 30d supply, fill #2

## 2022-08-21 MED ORDER — CARVEDILOL 6.25 MG PO TABS
6.2500 mg | ORAL_TABLET | Freq: Two times a day (BID) | ORAL | 3 refills | Status: DC
  Filled 2022-08-21: qty 60, 30d supply, fill #0
  Filled 2022-09-12: qty 60, 30d supply, fill #1
  Filled 2022-10-15: qty 60, 30d supply, fill #2
  Filled 2022-11-20: qty 60, 30d supply, fill #3
  Filled 2022-12-31 – 2023-01-23 (×2): qty 60, 30d supply, fill #4
  Filled 2023-02-27: qty 60, 30d supply, fill #5

## 2022-08-21 MED ORDER — SPIRONOLACTONE 25 MG PO TABS
25.0000 mg | ORAL_TABLET | Freq: Every day | ORAL | 2 refills | Status: DC
  Filled 2022-08-21: qty 30, 30d supply, fill #0
  Filled 2022-09-12: qty 30, 30d supply, fill #1
  Filled 2022-10-15: qty 30, 30d supply, fill #2

## 2022-08-21 MED ORDER — TORSEMIDE 20 MG PO TABS
20.0000 mg | ORAL_TABLET | Freq: Every day | ORAL | 11 refills | Status: DC
  Filled 2022-08-21: qty 30, 30d supply, fill #0
  Filled 2022-10-15: qty 30, 30d supply, fill #1
  Filled 2022-11-20: qty 30, 30d supply, fill #2
  Filled 2023-01-23: qty 30, 30d supply, fill #3
  Filled 2023-02-27: qty 30, 30d supply, fill #4

## 2022-08-21 MED ORDER — POTASSIUM CHLORIDE CRYS ER 20 MEQ PO TBCR
20.0000 meq | EXTENDED_RELEASE_TABLET | Freq: Two times a day (BID) | ORAL | 3 refills | Status: DC
Start: 2022-08-21 — End: 2023-04-02
  Filled 2022-08-21: qty 60, 30d supply, fill #0
  Filled 2022-09-12: qty 60, 30d supply, fill #1
  Filled 2022-10-15: qty 60, 30d supply, fill #2
  Filled 2022-11-20: qty 60, 30d supply, fill #3
  Filled 2022-12-31 – 2023-01-23 (×2): qty 60, 30d supply, fill #4

## 2022-08-21 NOTE — Telephone Encounter (Signed)
Patient aware and agreeable. 

## 2022-08-21 NOTE — Progress Notes (Signed)
ReDS Vest / Clip - 08/21/22 1000       ReDS Vest / Clip   Station Marker C    Ruler Value 31    ReDS Value Range High volume overload    ReDS Actual Value 48

## 2022-08-21 NOTE — Progress Notes (Signed)
Medication Samples have been provided to the patient.  Drug name: Delene Loll       Strength: 49/51        Qty: 4  LOT: [place].Cope  Exp.Date: 08/25  Dosing instructions: take 2 tablets Twice daily   The patient has been instructed regarding the correct time, dose, and frequency of taking this medication, including desired effects and most common side effects.   [name] M [name] 10:13 AM 08/21/2022  Medication Samples have been provided to the patient.  Drug name: Wilder Glade       Strength: 10mg         Qty: 4 boxes  LOT: [id number]  Exp.Date: 03-14-25  Dosing instructions: take 1 tablet daily  The patient has been instructed regarding the correct time, dose, and frequency of taking this medication, including desired effects and most common side effects.   [name] M [name] 10:13 AM 08/21/2022

## 2022-08-21 NOTE — Patient Instructions (Addendum)
Labs done today. We will contact you only if your labs are abnormal.  TAKE Furoscix for 2 days. Take an extra 15meq( 2 tablets) of Potassium for 2 days.  No other medication changes were made. Please continue all current medications as prescribed.  Your physician recommends that you schedule a follow-up appointment in: 2 weeks  If you have any questions or concerns before your next appointment please send [place] a message through [place] or call our office at [phone].    TO LEAVE A MESSAGE FOR THE NURSE SELECT OPTION 2, PLEASE LEAVE A MESSAGE INCLUDING: YOUR NAME DATE OF BIRTH CALL BACK NUMBER REASON FOR CALL**this is important as we prioritize the call backs  YOU WILL RECEIVE A CALL BACK THE SAME DAY AS LONG AS YOU CALL BEFORE 4:00 PM   Do the following things EVERYDAY: Weigh yourself in the morning before breakfast. Write it down and keep it in a log. Take your medicines as prescribed Eat low salt foods--Limit salt (sodium) to 2000 mg per day.  Stay as active as you can everyday Limit all fluids for the day to less than 2 liters   At the [facility], you and your health needs are our priority. As part of our continuing mission to provide you with exceptional heart care, we have created designated Provider Care Teams. These Care Teams include your primary Cardiologist (physician) and Advanced Practice Providers (APPs- Physician Assistants and Nurse Practitioners) who all work together to provide you with the care you need, when you need it.   You may see any of the following providers on your designated Care Team at your next follow up: Dr Glori Bickers Dr Haynes Kerns, NP Lyda Jester, [place] Audry Riles, PharmD   Please be sure to bring in all your medications bottles to every appointment.
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
## 2022-09-13 ENCOUNTER — Other Ambulatory Visit (HOSPITAL_COMMUNITY): Payer: Self-pay

## 2022-09-14 ENCOUNTER — Encounter (HOSPITAL_COMMUNITY): Payer: Self-pay

## 2022-09-14 ENCOUNTER — Telehealth (HOSPITAL_COMMUNITY): Payer: Self-pay

## 2022-09-14 ENCOUNTER — Other Ambulatory Visit (HOSPITAL_COMMUNITY): Payer: Self-pay

## 2022-09-14 ENCOUNTER — Ambulatory Visit (HOSPITAL_COMMUNITY)
Admission: RE | Admit: 2022-09-14 | Discharge: 2022-09-14 | Disposition: A | Discharge status: Home / Self Care | Payer: Medicaid Other | Source: Ambulatory Visit | Attending: Family Medicine | Admitting: Family Medicine

## 2022-09-14 VITALS — BP 110/80 | HR 78 | Wt 249.6 lb

## 2022-09-14 DIAGNOSIS — Z951 Presence of aortocoronary bypass graft: Secondary | ICD-10-CM | POA: Diagnosis not present

## 2022-09-14 DIAGNOSIS — Z79899 Other long term (current) drug therapy: Secondary | ICD-10-CM | POA: Insufficient documentation

## 2022-09-14 DIAGNOSIS — I5022 Chronic systolic (congestive) heart failure: Secondary | ICD-10-CM | POA: Diagnosis not present

## 2022-09-14 DIAGNOSIS — I2510 Atherosclerotic heart disease of native coronary artery without angina pectoris: Secondary | ICD-10-CM | POA: Diagnosis not present

## 2022-09-14 DIAGNOSIS — R42 Dizziness and giddiness: Secondary | ICD-10-CM | POA: Insufficient documentation

## 2022-09-14 DIAGNOSIS — I4589 Other specified conduction disorders: Secondary | ICD-10-CM | POA: Diagnosis not present

## 2022-09-14 DIAGNOSIS — I252 Old myocardial infarction: Secondary | ICD-10-CM | POA: Diagnosis not present

## 2022-09-14 DIAGNOSIS — E669 Obesity, unspecified: Secondary | ICD-10-CM | POA: Insufficient documentation

## 2022-09-14 DIAGNOSIS — I255 Ischemic cardiomyopathy: Secondary | ICD-10-CM | POA: Diagnosis not present

## 2022-09-14 DIAGNOSIS — Z6841 Body Mass Index (BMI) 40.0 and over, adult: Secondary | ICD-10-CM | POA: Insufficient documentation

## 2022-09-14 DIAGNOSIS — G4733 Obstructive sleep apnea (adult) (pediatric): Secondary | ICD-10-CM | POA: Diagnosis not present

## 2022-09-14 DIAGNOSIS — Z7984 Long term (current) use of oral hypoglycemic drugs: Secondary | ICD-10-CM | POA: Diagnosis not present

## 2022-09-14 DIAGNOSIS — R0602 Shortness of breath: Secondary | ICD-10-CM | POA: Diagnosis present

## 2022-09-14 DIAGNOSIS — I110 Hypertensive heart disease with heart failure: Secondary | ICD-10-CM | POA: Insufficient documentation

## 2022-09-14 DIAGNOSIS — Z7902 Long term (current) use of antithrombotics/antiplatelets: Secondary | ICD-10-CM | POA: Insufficient documentation

## 2022-09-14 LAB — BASIC METABOLIC PANEL WITH GFR
Anion gap: 11 (ref 5–15)
BUN: 17 mg/dL (ref 6–20)
CO2: 20 mmol/L — ABNORMAL LOW (ref 22–32)
Calcium: 9.4 mg/dL (ref 8.9–10.3)
Chloride: 104 mmol/L (ref 98–111)
Creatinine, Ser: 1.17 mg/dL (ref 0.61–1.24)
GFR, Estimated: >60 mL/min
Glucose, Bld: 113 mg/dL — ABNORMAL HIGH (ref 70–99)
Potassium: 4.1 mmol/L (ref 3.5–5.1)
Sodium: 135 mmol/L (ref 135–145)

## 2022-09-14 MED ORDER — TRAZODONE HCL 50 MG PO TABS
25.0000 mg | ORAL_TABLET | Freq: Every evening | ORAL | 0 refills | Status: DC | PRN
  Filled 2022-09-14: qty 60, 60d supply, fill #0

## 2022-09-14 NOTE — Progress Notes (Signed)
ReDS Vest / Clip - 09/14/22 1100       ReDS Vest / Clip   Station Marker D    Ruler Value 36    ReDS Value Range Low volume    ReDS Actual Value 34
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
## 2022-09-14 NOTE — Telephone Encounter (Signed)
Advanced Heart Failure Patient Advocate Encounter   As of 09/14/2022, this patient has active Medicaid coverage and this will effect the patients needs for medication assistance.   Processing information has been added to WAM.   Stephaine H, CPhT Rx Patient Advocate Phone: [phone] 

## 2022-09-14 NOTE — Patient Instructions (Signed)
No change in medications. Labs drawn today - will call you if any abnormal. Rx sent to local pharmacy for Trazadone. Return to see Dr. Gala Romney in 3 months.

## 2022-09-23 ENCOUNTER — Encounter (HOSPITAL_COMMUNITY): Payer: Self-pay | Admitting: Cardiology

## 2022-09-24 ENCOUNTER — Other Ambulatory Visit (HOSPITAL_COMMUNITY): Payer: Self-pay

## 2022-09-24 ENCOUNTER — Telehealth (HOSPITAL_COMMUNITY): Payer: Self-pay

## 2022-09-24 MED ORDER — ENTRESTO 49-51 MG PO TABS
1.0000 | ORAL_TABLET | Freq: Two times a day (BID) | ORAL | 5 refills | Status: DC
  Filled 2022-09-24: qty 60, 30d supply, fill #0
  Filled 2022-11-20: qty 60, 30d supply, fill #1
  Filled 2022-12-31: qty 60, 30d supply, fill #2

## 2022-09-24 NOTE — Telephone Encounter (Addendum)
Pt aware, agreeable, and verbalized understanding     ----- Message from Laurey Morale, MD sent at 09/24/2022 10:09 AM EST ----- Please have him decrease Entresto to 49/51 bid with dizziness.  See if this helps.  Make sure he has followup with APP soon.

## 2022-10-01 ENCOUNTER — Ambulatory Visit (INDEPENDENT_AMBULATORY_CARE_PROVIDER_SITE_OTHER): Payer: Medicaid Other

## 2022-10-01 DIAGNOSIS — I255 Ischemic cardiomyopathy: Secondary | ICD-10-CM

## 2022-10-01 DIAGNOSIS — I5022 Chronic systolic (congestive) heart failure: Secondary | ICD-10-CM | POA: Diagnosis not present

## 2022-10-02 LAB — CUP PACEART REMOTE DEVICE CHECK
Battery Remaining Longevity: 162 mo
Battery Remaining Percentage: 100 %
Brady Statistic RV Percent Paced: 0 %
Date Time Interrogation Session: 20231218000100
HighPow Impedance: 82 Ohm
Implantable Lead Connection Status: 753985
Implantable Lead Implant Date: 20220909
Implantable Lead Location: 753860
Implantable Lead Model: 137
Implantable Lead Serial Number: 301248
Implantable Pulse Generator Implant Date: 20220909
Lead Channel Impedance Value: 688 Ohm
Lead Channel Pacing Threshold Amplitude: 0.9 V
Lead Channel Pacing Threshold Pulse Width: 0.4 ms
Lead Channel Setting Pacing Amplitude: 3.5 V
Lead Channel Setting Pacing Pulse Width: 0.4 ms
Lead Channel Setting Sensing Sensitivity: 0.6 mV
Pulse Gen Serial Number: 213174
Zone Setting Status: 755011

## 2022-10-04 ENCOUNTER — Other Ambulatory Visit (HOSPITAL_COMMUNITY): Payer: Self-pay

## 2022-10-05 ENCOUNTER — Telehealth (HOSPITAL_COMMUNITY): Payer: Self-pay

## 2022-10-05 NOTE — Telephone Encounter (Signed)
Advanced Heart Failure Patient Advocate Encounter  Prior authorization is required for Entresto. PA submitted and APPROVED on 10/04/22. Determination letter has been added to patient chart.  Key [id number] Effective: 10/04/22 - 03/15/23   Burnell Blanks, CPhT Rx Patient Advocate Phone: [phone]

## 2022-10-10 ENCOUNTER — Other Ambulatory Visit (HOSPITAL_COMMUNITY): Payer: Self-pay

## 2022-10-15 ENCOUNTER — Other Ambulatory Visit: Payer: Self-pay | Admitting: Orthopedic Surgery

## 2022-10-15 ENCOUNTER — Other Ambulatory Visit: Payer: Self-pay

## 2022-10-15 ENCOUNTER — Other Ambulatory Visit (HOSPITAL_COMMUNITY): Payer: Self-pay | Admitting: Family Medicine

## 2022-10-15 DIAGNOSIS — I5022 Chronic systolic (congestive) heart failure: Secondary | ICD-10-CM

## 2022-10-15 MED ORDER — GABAPENTIN 300 MG PO CAPS
300.0000 mg | ORAL_CAPSULE | Freq: Three times a day (TID) | ORAL | 0 refills | Status: DC
  Filled 2022-10-15: qty 90, 30d supply, fill #0

## 2022-10-16 ENCOUNTER — Other Ambulatory Visit: Payer: Self-pay

## 2022-10-25 ENCOUNTER — Other Ambulatory Visit (HOSPITAL_COMMUNITY): Payer: Self-pay

## 2022-11-05 NOTE — Progress Notes (Signed)
Remote ICD transmission.   

## 2022-11-12 ENCOUNTER — Other Ambulatory Visit (HOSPITAL_COMMUNITY): Payer: Self-pay | Admitting: Family Medicine

## 2022-11-12 ENCOUNTER — Other Ambulatory Visit: Payer: Self-pay | Admitting: Orthopedic Surgery

## 2022-11-12 DIAGNOSIS — I5022 Chronic systolic (congestive) heart failure: Secondary | ICD-10-CM

## 2022-11-14 ENCOUNTER — Other Ambulatory Visit (HOSPITAL_COMMUNITY): Payer: Self-pay | Admitting: Family Medicine

## 2022-11-14 ENCOUNTER — Other Ambulatory Visit: Payer: Self-pay | Admitting: Orthopedic Surgery

## 2022-11-14 DIAGNOSIS — I5022 Chronic systolic (congestive) heart failure: Secondary | ICD-10-CM

## 2022-11-15 ENCOUNTER — Other Ambulatory Visit (HOSPITAL_COMMUNITY): Payer: Self-pay

## 2022-11-20 ENCOUNTER — Other Ambulatory Visit: Payer: Self-pay | Admitting: Orthopedic Surgery

## 2022-11-20 ENCOUNTER — Other Ambulatory Visit: Payer: Self-pay

## 2022-11-20 ENCOUNTER — Other Ambulatory Visit (HOSPITAL_COMMUNITY): Payer: Self-pay | Admitting: Adult Health

## 2022-11-20 ENCOUNTER — Other Ambulatory Visit (HOSPITAL_COMMUNITY): Payer: Self-pay

## 2022-11-20 DIAGNOSIS — E785 Hyperlipidemia, unspecified: Secondary | ICD-10-CM

## 2022-11-20 MED ORDER — SPIRONOLACTONE 25 MG PO TABS
25.0000 mg | ORAL_TABLET | Freq: Every day | ORAL | 2 refills | Status: DC
  Filled 2022-11-20: qty 30, 30d supply, fill #0
  Filled 2022-12-31 – 2023-01-23 (×2): qty 30, 30d supply, fill #1
  Filled 2023-02-27: qty 30, 30d supply, fill #2

## 2022-11-20 MED ORDER — EZETIMIBE 10 MG PO TABS
10.0000 mg | ORAL_TABLET | Freq: Every day | ORAL | 0 refills | Status: DC
  Filled 2022-11-20: qty 90, 90d supply, fill #0

## 2022-11-21 ENCOUNTER — Other Ambulatory Visit (HOSPITAL_COMMUNITY): Payer: Self-pay

## 2022-11-23 ENCOUNTER — Other Ambulatory Visit (HOSPITAL_COMMUNITY): Payer: Self-pay

## 2022-11-27 ENCOUNTER — Telehealth: Payer: Self-pay | Admitting: Orthopedic Surgery

## 2022-11-27 ENCOUNTER — Other Ambulatory Visit (HOSPITAL_COMMUNITY): Payer: Self-pay | Admitting: Family Medicine

## 2022-11-27 ENCOUNTER — Other Ambulatory Visit (HOSPITAL_COMMUNITY): Payer: Self-pay

## 2022-11-27 DIAGNOSIS — I5022 Chronic systolic (congestive) heart failure: Secondary | ICD-10-CM

## 2022-11-27 MED ORDER — GABAPENTIN 300 MG PO CAPS
300.0000 mg | ORAL_CAPSULE | Freq: Three times a day (TID) | ORAL | 0 refills | Status: AC
  Filled 2022-11-27: qty 90, 30d supply, fill #0

## 2022-11-30 ENCOUNTER — Other Ambulatory Visit (HOSPITAL_COMMUNITY): Payer: Self-pay

## 2022-12-13 ENCOUNTER — Encounter: Payer: Self-pay | Admitting: Radiology

## 2022-12-21 ENCOUNTER — Encounter (HOSPITAL_COMMUNITY): Payer: Medicaid Other | Admitting: Internal Medicine

## 2022-12-31 ENCOUNTER — Ambulatory Visit (HOSPITAL_COMMUNITY)
Admission: RE | Admit: 2022-12-31 | Discharge: 2022-12-31 | Disposition: A | Discharge status: Home / Self Care | Payer: Medicare Other | Source: Ambulatory Visit | Attending: Cardiology | Admitting: Cardiology

## 2022-12-31 ENCOUNTER — Ambulatory Visit (INDEPENDENT_AMBULATORY_CARE_PROVIDER_SITE_OTHER): Payer: Medicare Other

## 2022-12-31 ENCOUNTER — Other Ambulatory Visit: Payer: Self-pay | Admitting: Orthopaedic Surgery

## 2022-12-31 ENCOUNTER — Other Ambulatory Visit (HOSPITAL_COMMUNITY): Payer: Self-pay

## 2022-12-31 ENCOUNTER — Other Ambulatory Visit (HOSPITAL_COMMUNITY): Payer: Self-pay | Admitting: Family Medicine

## 2022-12-31 ENCOUNTER — Encounter (HOSPITAL_COMMUNITY): Payer: Self-pay | Admitting: Cardiology

## 2022-12-31 VITALS — BP 112/78 | HR 65 | Wt 261.0 lb

## 2022-12-31 DIAGNOSIS — G4733 Obstructive sleep apnea (adult) (pediatric): Secondary | ICD-10-CM | POA: Diagnosis not present

## 2022-12-31 DIAGNOSIS — E669 Obesity, unspecified: Secondary | ICD-10-CM | POA: Insufficient documentation

## 2022-12-31 DIAGNOSIS — I110 Hypertensive heart disease with heart failure: Secondary | ICD-10-CM | POA: Insufficient documentation

## 2022-12-31 DIAGNOSIS — Z79899 Other long term (current) drug therapy: Secondary | ICD-10-CM | POA: Insufficient documentation

## 2022-12-31 DIAGNOSIS — I5022 Chronic systolic (congestive) heart failure: Secondary | ICD-10-CM

## 2022-12-31 DIAGNOSIS — I4589 Other specified conduction disorders: Secondary | ICD-10-CM | POA: Diagnosis not present

## 2022-12-31 DIAGNOSIS — I255 Ischemic cardiomyopathy: Secondary | ICD-10-CM

## 2022-12-31 DIAGNOSIS — E785 Hyperlipidemia, unspecified: Secondary | ICD-10-CM | POA: Insufficient documentation

## 2022-12-31 DIAGNOSIS — Z951 Presence of aortocoronary bypass graft: Secondary | ICD-10-CM | POA: Diagnosis not present

## 2022-12-31 DIAGNOSIS — I2510 Atherosclerotic heart disease of native coronary artery without angina pectoris: Secondary | ICD-10-CM | POA: Insufficient documentation

## 2022-12-31 DIAGNOSIS — Z6841 Body Mass Index (BMI) 40.0 and over, adult: Secondary | ICD-10-CM | POA: Diagnosis not present

## 2022-12-31 DIAGNOSIS — Z87891 Personal history of nicotine dependence: Secondary | ICD-10-CM | POA: Insufficient documentation

## 2022-12-31 DIAGNOSIS — I252 Old myocardial infarction: Secondary | ICD-10-CM | POA: Diagnosis not present

## 2022-12-31 DIAGNOSIS — Z7902 Long term (current) use of antithrombotics/antiplatelets: Secondary | ICD-10-CM | POA: Diagnosis not present

## 2022-12-31 DIAGNOSIS — Z7984 Long term (current) use of oral hypoglycemic drugs: Secondary | ICD-10-CM | POA: Diagnosis not present

## 2022-12-31 LAB — CBC
HCT: 46.4 % (ref 39.0–52.0)
Hemoglobin: 15.8 g/dL (ref 13.0–17.0)
MCH: 30.2 pg (ref 26.0–34.0)
MCHC: 34.1 g/dL (ref 30.0–36.0)
MCV: 88.5 fL (ref 80.0–100.0)
Platelets: 327 10*3/uL (ref 150–400)
RBC: 5.24 MIL/uL (ref 4.22–5.81)
RDW: 13.2 % (ref 11.5–15.5)
WBC: 7.7 10*3/uL (ref 4.0–10.5)
nRBC: 0 % (ref 0.0–0.2)

## 2022-12-31 LAB — CUP PACEART REMOTE DEVICE CHECK
Battery Remaining Longevity: 162 mo
Battery Remaining Percentage: 100 %
Brady Statistic RV Percent Paced: 0 %
Date Time Interrogation Session: 20240318001400
HighPow Impedance: 80 Ohm
Implantable Lead Connection Status: 753985
Implantable Lead Implant Date: 20220909
Implantable Lead Location: 753860
Implantable Lead Model: 137
Implantable Lead Serial Number: 301248
Implantable Pulse Generator Implant Date: 20220909
Lead Channel Impedance Value: 709 Ohm
Lead Channel Pacing Threshold Amplitude: 0.7 V
Lead Channel Pacing Threshold Pulse Width: 0.4 ms
Lead Channel Setting Pacing Amplitude: 3.5 V
Lead Channel Setting Pacing Pulse Width: 0.4 ms
Lead Channel Setting Sensing Sensitivity: 0.6 mV
Pulse Gen Serial Number: 213174
Zone Setting Status: 755011

## 2022-12-31 LAB — BASIC METABOLIC PANEL
Anion gap: 13 (ref 5–15)
BUN: 11 mg/dL (ref 6–20)
CO2: 18 mmol/L — ABNORMAL LOW (ref 22–32)
Calcium: 9 mg/dL (ref 8.9–10.3)
Chloride: 104 mmol/L (ref 98–111)
Creatinine, Ser: 0.92 mg/dL (ref 0.61–1.24)
GFR, Estimated: >60 mL/min (ref >60)
Glucose, Bld: 103 mg/dL — ABNORMAL HIGH (ref 70–99)
Potassium: 4.1 mmol/L (ref 3.5–5.1)
Sodium: 135 mmol/L (ref 135–145)

## 2022-12-31 LAB — DIGOXIN LEVEL: Digoxin Level: <0.2 ng/mL — ABNORMAL LOW (ref 0.8–2.0)

## 2022-12-31 MED ORDER — TRAZODONE HCL 50 MG PO TABS
25.0000 mg | ORAL_TABLET | Freq: Every evening | ORAL | 0 refills | Status: AC | PRN
  Filled 2022-12-31 – 2023-01-23 (×2): qty 60, 60d supply, fill #0

## 2022-12-31 MED ORDER — ENTRESTO 97-103 MG PO TABS
1.0000 | ORAL_TABLET | Freq: Two times a day (BID) | ORAL | 11 refills | Status: DC
  Filled 2022-12-31 – 2023-02-27 (×2): qty 60, 30d supply, fill #0
  Filled 2023-05-10 – 2023-07-10 (×2): qty 60, 30d supply, fill #1
  Filled 2023-08-08: qty 60, 30d supply, fill #2
  Filled 2023-10-23: qty 60, 30d supply, fill #3
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
## 2022-12-31 NOTE — Patient Instructions (Signed)
INCREASE Entresto to 97/ mg Twice daily  Labs done today, your results will be available in MyChart, we will contact you for abnormal readings.  Repeat blood work in 10 days at [place].  Your physician recommends that you schedule a follow-up appointment in:  3 months  If you have any questions or concerns before your next appointment please send [place] a message through [place] or call our office at [phone].    TO LEAVE A MESSAGE FOR THE NURSE SELECT OPTION 2, PLEASE LEAVE A MESSAGE INCLUDING: YOUR NAME DATE OF BIRTH CALL BACK NUMBER REASON FOR CALL**this is important as we prioritize the call backs  YOU WILL RECEIVE A CALL BACK THE SAME DAY AS LONG AS YOU CALL BEFORE 4:00 PM  At the [facility], you and your health needs are our priority. As part of our continuing mission to provide you with exceptional heart care, we have created designated Provider Care Teams. These Care Teams include your primary Cardiologist (physician) and Advanced Practice Providers (APPs- Physician Assistants and Nurse Practitioners) who all work together to provide you with the care you need, when you need it.   You may see any of the following providers on your designated Care Team at your next follow up: Dr Glori Bickers Dr Loralie Champagne Dr. Roxana Hires, NP Lyda Jester, [place] [facility] [place], [place] Forestine Na, NP Audry Riles, PharmD   Please be sure to bring in all your medications bottles to every appointment.    Thank you for choosing [facility]

## 2023-01-01 ENCOUNTER — Other Ambulatory Visit: Payer: Self-pay

## 2023-01-02 ENCOUNTER — Other Ambulatory Visit: Payer: Self-pay | Admitting: Orthopaedic Surgery

## 2023-01-07 ENCOUNTER — Other Ambulatory Visit (HOSPITAL_COMMUNITY): Payer: Self-pay

## 2023-01-14 ENCOUNTER — Other Ambulatory Visit (HOSPITAL_COMMUNITY): Payer: Self-pay

## 2023-01-23 ENCOUNTER — Other Ambulatory Visit: Payer: Self-pay

## 2023-01-23 ENCOUNTER — Other Ambulatory Visit: Payer: Self-pay | Admitting: Orthopaedic Surgery

## 2023-01-23 ENCOUNTER — Other Ambulatory Visit (HOSPITAL_COMMUNITY): Payer: Self-pay

## 2023-01-28 ENCOUNTER — Other Ambulatory Visit: Payer: Self-pay | Admitting: Orthopaedic Surgery

## 2023-01-28 ENCOUNTER — Other Ambulatory Visit (HOSPITAL_COMMUNITY): Payer: Self-pay

## 2023-02-01 ENCOUNTER — Other Ambulatory Visit (HOSPITAL_COMMUNITY): Payer: Self-pay

## 2023-02-06 NOTE — Progress Notes (Signed)
Remote ICD transmission.   

## 2023-02-26 ENCOUNTER — Encounter: Payer: Self-pay | Admitting: Ophthalmology

## 2023-02-26 NOTE — H&P (Signed)
[name] is an 42 y.o. male.   Chief Complaint: I have constant double vision and would like to have this fixed. HPI: 42 y.o. WM c  extensive Cardiovascular  hx : s/p CABG : cardiac defibrillator :HTN , Hypercholesterolemia  and s/p  traumatic SDH presents for elective repair of Right Esotropia and incalcitrant diplopia .  Past Medical History:  Diagnosis Date   CAD (coronary artery disease)    inferiot MI; LHC with EF 55% and basal to mid inferior hypokinesis. totally occluded RCA w/weak L-R collaterals. 50% ostial OM1. long 70% proximal LAD. Pt had promus DES to LAD and RCA. echo 9/11: EF 55%, no regional wall motion abnormalities    CHF (congestive heart failure) (HCC)    GI bleed 9/11   secondary to mallory-weiss tear with endoscopic clipping    Hyperlipidemia    Hypertension    Nephrolithiasis    Transfusion history 2011/2012   Traumatic subdural hematoma (HCC) 8/11   with secondary tonic clonic seizure (9/11)    Past Surgical History:  Procedure Laterality Date   CARDIAC SURGERY     CORONARY ARTERY BYPASS GRAFT N/A 06/24/2020   Procedure: CORONARY ARTERY BYPASS GRAFTING (CABG) x 6, bilateral IMA, LIMA TO LAD, RIMA TO PDA, LEFT RADIAL ARTERY TO DIAG. 1 SEQ TO OM1, SVG TO OM2, SVG TO ACUTE MARGINAL;  Surgeon: Linden Dolin, MD;  Location: [facility] OR;  Service: Open Heart Surgery;  Laterality: N/A;   ENDOVEIN HARVEST OF GREATER SAPHENOUS VEIN Right 06/24/2020   Procedure: ENDOVEIN HARVEST OF GREATER SAPHENOUS VEIN;  Surgeon: Linden Dolin, MD;  Location: [facility] OR;  Service: Open Heart Surgery;  Laterality: Right;   IABP INSERTION N/A 06/23/2020   Procedure: IABP Insertion;  Surgeon: Yvonne Kendall, MD;  Location: [facility] INVASIVE CV LAB;  Service: Cardiovascular;  Laterality: N/A;   ICD IMPLANT N/A 06/23/2021   Procedure: ICD IMPLANT;  Surgeon: Marinus Maw, MD;  Location: [facility] INVASIVE CV LAB;  Service: Cardiovascular;  Laterality: N/A;   LEFT HEART CATH AND CORONARY ANGIOGRAPHY N/A  06/23/2020   Procedure: LEFT HEART CATH AND CORONARY ANGIOGRAPHY;  Surgeon: Yvonne Kendall, MD;  Location: [facility] INVASIVE CV LAB;  Service: Cardiovascular;  Laterality: N/A;   patients states had two stent surgery to his heart  03/30/2010   RADIAL ARTERY HARVEST Left 06/24/2020   Procedure: RADIAL ARTERY HARVEST;  Surgeon: Linden Dolin, MD;  Location: [facility] OR;  Service: Open Heart Surgery;  Laterality: Left;   RIGHT HEART CATH N/A 05/11/2021   Procedure: RIGHT HEART CATH;  Surgeon: Laurey Morale, MD;  Location: [facility] INVASIVE CV LAB;  Service: Cardiovascular;  Laterality: N/A;   TEE WITHOUT CARDIOVERSION N/A 06/24/2020   Procedure: TRANSESOPHAGEAL ECHOCARDIOGRAM (TEE);  Surgeon: Linden Dolin, MD;  Location: [facility] OR;  Service: Open Heart Surgery;  Laterality: N/A;    Family History  Problem Relation Age of Onset   Rheum arthritis Mother    Diabetes Mother    Hyperlipidemia Mother    Hypertension Mother    Heart disease Father    Heart attack Other        6 way bypass   Stroke Other    Coronary artery disease Other        noncontributory for early CAD and MI   Stroke Other    Social History:  reports that he has quit smoking. His smoking use included cigarettes. He smoked an average of 1.5 packs per day. He has never used smokeless tobacco.  He reports current alcohol use. He reports that he does not use drugs.  Allergies:  Allergies  Allergen Reactions   Penicillins Anaphylaxis, Swelling and Rash    "Rash from head to toe, throat closed up" with amoxicillin at 42 years old  PCN reaction causing immediate rash, facial/tongue/throat swelling, SOB or lightheadedness with hypotension: Yes Has patient had a PCN reaction causing severe rash involving mucus membranes or skin necrosis: Yes Has patient had a PCN reaction that required hospitalization: No Has patient had a PCN reaction occurring within the last 10 years: No If all of the above answers are "NO", then may proceed with  Cephalosporin use.    No medications prior to admission.    No results found for this or any previous visit (from the past 48 hour(s)). No results found.  Review of Systems  Constitutional: Negative.   HENT: Negative.    Eyes:        Esotropia od   Cardiovascular:        Extensive hx controlled c current interventions.  Gastrointestinal: Negative.   Endocrine: Negative.   Musculoskeletal: Negative.   Allergic/Immunologic: Negative.   Neurological: Negative.   Psychiatric/Behavioral: Negative.      There were no vitals taken for this visit. Physical Exam Constitutional:      Appearance: Normal appearance.  HENT:     Head: Normocephalic and atraumatic.  Eyes:     Extraocular Movements: Extraocular movements intact.     Pupils: Pupils are equal, round, and reactive to light.  Cardiovascular:     Comments: See HPI. Pulmonary:     Effort: Pulmonary effort is normal.  Musculoskeletal:        General: Normal range of motion.     Cervical back: Normal range of motion.  Neurological:     General: No focal deficit present.     Mental Status: He is alert and oriented to person, place, and time.  Psychiatric:        Behavior: Behavior normal.      Assessment/Plan Esotropia /Diplopia  Plan:  RMR recess: RLR recession : AS @ RMR under general anesthesia.  Aura Camps, MD 02/26/2023, 4:24 PM

## 2023-02-27 ENCOUNTER — Other Ambulatory Visit (HOSPITAL_COMMUNITY): Payer: Self-pay | Admitting: Adult Health

## 2023-02-27 ENCOUNTER — Other Ambulatory Visit (HOSPITAL_COMMUNITY): Payer: Self-pay | Admitting: Cardiology

## 2023-02-27 ENCOUNTER — Other Ambulatory Visit (HOSPITAL_COMMUNITY): Payer: Self-pay

## 2023-02-27 ENCOUNTER — Other Ambulatory Visit (HOSPITAL_COMMUNITY): Payer: Self-pay | Admitting: Family Medicine

## 2023-02-27 DIAGNOSIS — E785 Hyperlipidemia, unspecified: Secondary | ICD-10-CM

## 2023-02-27 DIAGNOSIS — I5022 Chronic systolic (congestive) heart failure: Secondary | ICD-10-CM

## 2023-02-27 MED ORDER — DAPAGLIFLOZIN PROPANEDIOL 10 MG PO TABS
10.0000 mg | ORAL_TABLET | Freq: Every day | ORAL | 6 refills | Status: DC
  Filled 2023-02-27 – 2023-03-25 (×3): qty 30, 30d supply, fill #0

## 2023-02-27 MED ORDER — CLOPIDOGREL BISULFATE 75 MG PO TABS
75.0000 mg | ORAL_TABLET | Freq: Every day | ORAL | 0 refills | Status: DC
  Filled 2023-02-27: qty 90, 90d supply, fill #0

## 2023-02-27 MED ORDER — EZETIMIBE 10 MG PO TABS
10.0000 mg | ORAL_TABLET | Freq: Every day | ORAL | 0 refills | Status: DC
Start: 2023-02-27 — End: 2023-10-23
  Filled 2023-02-27: qty 30, 30d supply, fill #0
  Filled 2023-05-10 – 2023-07-10 (×2): qty 30, 30d supply, fill #1
  Filled 2023-08-08: qty 30, 30d supply, fill #2

## 2023-02-28 ENCOUNTER — Other Ambulatory Visit (HOSPITAL_COMMUNITY): Payer: Self-pay | Admitting: Family Medicine

## 2023-02-28 ENCOUNTER — Other Ambulatory Visit (HOSPITAL_COMMUNITY): Payer: Self-pay

## 2023-02-28 ENCOUNTER — Other Ambulatory Visit: Payer: Self-pay

## 2023-02-28 DIAGNOSIS — I5022 Chronic systolic (congestive) heart failure: Secondary | ICD-10-CM

## 2023-03-01 ENCOUNTER — Other Ambulatory Visit (HOSPITAL_COMMUNITY): Payer: Self-pay

## 2023-03-01 ENCOUNTER — Other Ambulatory Visit (HOSPITAL_COMMUNITY): Payer: Self-pay | Admitting: Family Medicine

## 2023-03-01 DIAGNOSIS — I5022 Chronic systolic (congestive) heart failure: Secondary | ICD-10-CM

## 2023-03-13 ENCOUNTER — Other Ambulatory Visit (HOSPITAL_COMMUNITY): Payer: Self-pay

## 2023-03-15 ENCOUNTER — Other Ambulatory Visit (HOSPITAL_COMMUNITY): Payer: Self-pay

## 2023-03-19 ENCOUNTER — Other Ambulatory Visit (HOSPITAL_COMMUNITY): Payer: Self-pay

## 2023-03-25 ENCOUNTER — Other Ambulatory Visit (HOSPITAL_COMMUNITY): Payer: Self-pay

## 2023-03-26 ENCOUNTER — Encounter (HOSPITAL_COMMUNITY): Payer: Self-pay | Admitting: Vascular Surgery

## 2023-03-26 ENCOUNTER — Other Ambulatory Visit: Payer: Self-pay

## 2023-03-26 ENCOUNTER — Encounter (HOSPITAL_COMMUNITY): Payer: Self-pay | Admitting: Ophthalmology

## 2023-03-26 ENCOUNTER — Telehealth (HOSPITAL_COMMUNITY): Payer: Self-pay | Admitting: Pharmacy Technician

## 2023-03-26 ENCOUNTER — Other Ambulatory Visit (HOSPITAL_COMMUNITY): Payer: Self-pay

## 2023-03-26 NOTE — Telephone Encounter (Signed)
Advanced Heart Failure Patient Advocate Encounter  Prior Authorization for [name] has been approved.    PA# [id number] Effective dates: 03/26/23 through 03/25/24  Patients co-pay is $4  Archer Asa, CPhT

## 2023-03-26 NOTE — Progress Notes (Addendum)
PCP - Kathlen Brunswick, NP Cardiologist - Marca Ancona, MD  PPM/ICD - yes Device Orders - yes - requested Rep Notified - yes  Chest x-ray - 12/31/22 EKG - 12/31/22 Stress Test - 07/31/21 ECHO - 06/22/22 Cardiac Cath - 05/11/21  CPAP - n/a  Fasting Blood Sugar - n/a  Blood Thinner Instructions: Plavix - last dose - 03/24/23 per patient Patient was instructed: As of today, STOP taking any Aspirin (unless otherwise instructed by your surgeon) Aleve, Naproxen, Ibuprofen, Motrin, Advil, Goody's, BC's, all herbal medications, fish oil, and all vitamins.  [name] - patient verbalized that he is not taking Farxiga at this time  ERAS Protcol - yes, until 07:30 o'clock  COVID TEST- n/a  Anesthesia review: yes  Patient verbally denies any shortness of breath, fever, cough and chest pain during phone call   -------------  SDW INSTRUCTIONS given:  Your procedure is scheduled on Wednesday, June 12th, 2024.  Report to [facility] Main Entrance "A" at 08:00 A.M., and check in at the Admitting office.  Call this number if you have problems the morning of surgery:  [phone]   Remember:  Do not eat after midnight the night before your surgery  You may drink clear liquids until 07:30 the morning of your surgery.   Clear liquids allowed are: Water, Non-Citrus Juices (without pulp), Carbonated Beverages, Clear Tea, Black Coffee Only, and Gatorade    Take these medicines the morning of surgery with A SIP OF WATER: Coreg, Digoxin, Zetia, Gabapentin, Crestor  PRN: Ativan   The day of surgery:                     Do not wear jewelry,             Do not wear lotions, powders, colognes, or deodorant.            Men may shave face and neck.            Do not bring valuables to the hospital.            [facility] is not responsible for any belongings or valuables.  Do NOT Smoke (Tobacco/Vaping) 24 hours prior to your procedure If you use a CPAP at night, you may bring all equipment for  your overnight stay.   Contacts, glasses, dentures or bridgework may not be worn into surgery.      For patients admitted to the hospital, discharge time will be determined by your treatment team.   Patients discharged the day of surgery will not be allowed to drive home, and someone needs to stay with them for 24 hours.    Special instructions:   [facility]- Preparing For Surgery  Before surgery, you can play an important role. Because skin is not sterile, your skin needs to be as free of germs as possible. You can reduce the number of germs on your skin by washing with CHG (chlorahexidine gluconate) Soap before surgery.  CHG is an antiseptic cleaner which kills germs and bonds with the skin to continue killing germs even after washing.    Oral Hygiene is also important to reduce your risk of infection.  Remember - BRUSH YOUR TEETH THE MORNING OF SURGERY WITH YOUR REGULAR TOOTHPASTE  Please do not use if you have an allergy to CHG or antibacterial soaps. If your skin becomes reddened/irritated stop using the CHG.  Do not shave (including legs and underarms) for at least 48 hours prior to first CHG shower.  It is OK to shave your face.  Please follow these instructions carefully.   Shower the NIGHT BEFORE SURGERY and the MORNING OF SURGERY with DIAL Soap.   Pat yourself dry with a CLEAN TOWEL.  Wear CLEAN PAJAMAS to bed the night before surgery  Place CLEAN SHEETS on your bed the night of your first shower and DO NOT SLEEP WITH PETS.   Day of Surgery: Please shower morning of surgery  Wear Clean/Comfortable clothing the morning of surgery Do not apply any deodorants/lotions.   Remember to brush your teeth WITH YOUR REGULAR TOOTHPASTE.   Questions were answered. Patient verbalized understanding of instructions.

## 2023-03-26 NOTE — Anesthesia Preprocedure Evaluation (Signed)
Anesthesia Evaluation    Airway        Dental   Pulmonary former smoker          Cardiovascular hypertension,      Neuro/Psych    GI/Hepatic   Endo/Other    Renal/GU      Musculoskeletal   Abdominal   Peds  Hematology   Anesthesia Other Findings   Reproductive/Obstetrics                             Anesthesia Physical Anesthesia Plan  ASA:   Anesthesia Plan:    Post-op Pain Management:    Induction:   PONV Risk Score and Plan:   Airway Management Planned:   Additional Equipment:   Intra-op Plan:   Post-operative Plan:   Informed Consent:   Plan Discussed with:   Anesthesia Plan Comments: (PAT note written 03/26/2023 by Shonna Chock, PA-C.  )       Anesthesia Quick Evaluation
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
## 2023-03-26 NOTE — Progress Notes (Signed)
Preop device orders requested on 03/26/23 

## 2023-03-26 NOTE — Telephone Encounter (Signed)
Patient Advocate Encounter   Received notification from Medicaid that prior authorization for [name] is required.   PA submitted on [employer] [id number] W Status is pending   Will continue to follow.

## 2023-03-26 NOTE — Progress Notes (Signed)
The [employer] Rep was notified about this patient having surgery tomorrow at 10:30 o'clock at [facility]. Rep was informed that the patient will be in short stay at 08:00 o'clock.

## 2023-03-27 ENCOUNTER — Ambulatory Visit (HOSPITAL_COMMUNITY): Admission: RE | Admit: 2023-03-27 | Payer: Medicare Other | Source: Home / Self Care | Admitting: Ophthalmology

## 2023-03-27 HISTORY — DX: Presence of automatic (implantable) cardiac defibrillator: Z95.810

## 2023-03-27 SURGERY — MUSCLE RECESSION/RESECTION
Anesthesia: General | Laterality: Bilateral

## 2023-03-27 NOTE — Progress Notes (Signed)
Patients procedure cancelled today due to need for patient to follow up with Dr Ladona Ridgel for ICD check up, patient needs his device checked

## 2023-04-01 ENCOUNTER — Ambulatory Visit (INDEPENDENT_AMBULATORY_CARE_PROVIDER_SITE_OTHER): Payer: Medicare Other

## 2023-04-01 DIAGNOSIS — I5022 Chronic systolic (congestive) heart failure: Secondary | ICD-10-CM

## 2023-04-01 DIAGNOSIS — I255 Ischemic cardiomyopathy: Secondary | ICD-10-CM

## 2023-04-01 LAB — CUP PACEART REMOTE DEVICE CHECK
Battery Remaining Longevity: 162 mo
Battery Remaining Percentage: 100 %
Brady Statistic RV Percent Paced: 0 %
Date Time Interrogation Session: 20240617000100
HighPow Impedance: 88 Ohm
Implantable Lead Connection Status: 753985
Implantable Lead Implant Date: 20220909
Implantable Lead Location: 753860
Implantable Lead Model: 137
Implantable Lead Serial Number: 301248
Implantable Pulse Generator Implant Date: 20220909
Lead Channel Impedance Value: 756 Ohm
Lead Channel Pacing Threshold Amplitude: 0.8 V
Lead Channel Pacing Threshold Pulse Width: 0.4 ms
Lead Channel Setting Pacing Amplitude: 3.5 V
Lead Channel Setting Pacing Pulse Width: 0.4 ms
Lead Channel Setting Sensing Sensitivity: 0.6 mV
Pulse Gen Serial Number: 213174
Zone Setting Status: 755011

## 2023-04-02 ENCOUNTER — Ambulatory Visit (HOSPITAL_COMMUNITY)
Admission: RE | Admit: 2023-04-02 | Discharge: 2023-04-02 | Disposition: A | Discharge status: Home / Self Care | Payer: Medicare Other | Source: Ambulatory Visit | Attending: Family Medicine | Admitting: Family Medicine

## 2023-04-02 ENCOUNTER — Other Ambulatory Visit (HOSPITAL_COMMUNITY): Payer: Self-pay

## 2023-04-02 ENCOUNTER — Encounter (HOSPITAL_COMMUNITY): Payer: Self-pay

## 2023-04-02 VITALS — BP 132/72 | HR 62 | Ht 66.5 in | Wt 251.0 lb

## 2023-04-02 DIAGNOSIS — I2510 Atherosclerotic heart disease of native coronary artery without angina pectoris: Secondary | ICD-10-CM

## 2023-04-02 DIAGNOSIS — Z951 Presence of aortocoronary bypass graft: Secondary | ICD-10-CM | POA: Insufficient documentation

## 2023-04-02 DIAGNOSIS — I110 Hypertensive heart disease with heart failure: Secondary | ICD-10-CM | POA: Diagnosis not present

## 2023-04-02 DIAGNOSIS — Z955 Presence of coronary angioplasty implant and graft: Secondary | ICD-10-CM | POA: Insufficient documentation

## 2023-04-02 DIAGNOSIS — H509 Unspecified strabismus: Secondary | ICD-10-CM | POA: Diagnosis not present

## 2023-04-02 DIAGNOSIS — Z6839 Body mass index (BMI) 39.0-39.9, adult: Secondary | ICD-10-CM | POA: Diagnosis not present

## 2023-04-02 DIAGNOSIS — I4589 Other specified conduction disorders: Secondary | ICD-10-CM | POA: Diagnosis not present

## 2023-04-02 DIAGNOSIS — I5022 Chronic systolic (congestive) heart failure: Secondary | ICD-10-CM

## 2023-04-02 DIAGNOSIS — Z87891 Personal history of nicotine dependence: Secondary | ICD-10-CM | POA: Insufficient documentation

## 2023-04-02 DIAGNOSIS — I252 Old myocardial infarction: Secondary | ICD-10-CM | POA: Insufficient documentation

## 2023-04-02 DIAGNOSIS — E669 Obesity, unspecified: Secondary | ICD-10-CM | POA: Diagnosis not present

## 2023-04-02 DIAGNOSIS — G4733 Obstructive sleep apnea (adult) (pediatric): Secondary | ICD-10-CM | POA: Diagnosis not present

## 2023-04-02 DIAGNOSIS — I255 Ischemic cardiomyopathy: Secondary | ICD-10-CM | POA: Insufficient documentation

## 2023-04-02 DIAGNOSIS — Z79899 Other long term (current) drug therapy: Secondary | ICD-10-CM | POA: Diagnosis not present

## 2023-04-02 DIAGNOSIS — Z7902 Long term (current) use of antithrombotics/antiplatelets: Secondary | ICD-10-CM | POA: Diagnosis not present

## 2023-04-02 LAB — LIPID PANEL
Cholesterol: 140 mg/dL (ref 0–200)
HDL: 38 mg/dL — ABNORMAL LOW (ref >40)
LDL Cholesterol: 56 mg/dL (ref 0–99)
Total CHOL/HDL Ratio: 3.7 RATIO
Triglycerides: 230 mg/dL — ABNORMAL HIGH (ref <150)
VLDL: 46 mg/dL — ABNORMAL HIGH (ref 0–40)

## 2023-04-02 LAB — BASIC METABOLIC PANEL
Anion gap: 15 (ref 5–15)
BUN: 13 mg/dL (ref 6–20)
CO2: 23 mmol/L (ref 22–32)
Calcium: 9.6 mg/dL (ref 8.9–10.3)
Chloride: 101 mmol/L (ref 98–111)
Creatinine, Ser: 1.06 mg/dL (ref 0.61–1.24)
GFR, Estimated: >60 mL/min (ref >60)
Glucose, Bld: 104 mg/dL — ABNORMAL HIGH (ref 70–99)
Potassium: 3.8 mmol/L (ref 3.5–5.1)
Sodium: 139 mmol/L (ref 135–145)

## 2023-04-02 LAB — BRAIN NATRIURETIC PEPTIDE: B Natriuretic Peptide: 32.5 pg/mL (ref 0.0–100.0)

## 2023-04-02 LAB — DIGOXIN LEVEL: Digoxin Level: <0.2 ng/mL — ABNORMAL LOW (ref 0.8–2.0)

## 2023-04-02 MED ORDER — POTASSIUM CHLORIDE CRYS ER 20 MEQ PO TBCR
20.0000 meq | EXTENDED_RELEASE_TABLET | ORAL | 6 refills | Status: DC | PRN
  Filled 2023-04-02: qty 30, 30d supply, fill #0

## 2023-04-02 MED ORDER — TORSEMIDE 20 MG PO TABS
20.0000 mg | ORAL_TABLET | ORAL | 6 refills | Status: DC | PRN
  Filled 2023-04-02: qty 30, 30d supply, fill #0

## 2023-04-02 MED ORDER — DAPAGLIFLOZIN PROPANEDIOL 10 MG PO TABS
10.0000 mg | ORAL_TABLET | Freq: Every day | ORAL | 6 refills | Status: DC
  Filled 2023-04-02: qty 30, 30d supply, fill #0
  Filled 2023-05-10 – 2023-07-10 (×2): qty 30, 30d supply, fill #1
  Filled 2023-10-23: qty 30, 30d supply, fill #2

## 2023-04-02 NOTE — Patient Instructions (Addendum)
Thank you for coming in today  If you had labs drawn today, any labs that are abnormal the clinic will call you No news is good news  Medications: RESTART Torsemide 20 mg as needed For weight gain of 3 lbs in 24 hours or 5 lbs in a week RESTART Potassium 20 meq 1 tablet take when taking Torsemide RESTART Farxiga 10 mg daily  Follow up appointments:  Your physician recommends that you schedule a follow-up appointment in:  3-4 months With Dr. Earlean Shawl will receive a reminder letter in the mail a few months in advance. If you don't receive a letter, please call our office to schedule the follow-up appointment.    Do the following things EVERYDAY: Weigh yourself in the morning before breakfast. Write it down and keep it in a log. Take your medicines as prescribed Eat low salt foods--Limit salt (sodium) to 2000 mg per day.  Stay as active as you can everyday Limit all fluids for the day to less than 2 liters   At the [facility], you and your health needs are our priority. As part of our continuing mission to provide you with exceptional heart care, we have created designated Provider Care Teams. These Care Teams include your primary Cardiologist (physician) and Advanced Practice Providers (APPs- Physician Assistants and Nurse Practitioners) who all work together to provide you with the care you need, when you need it.   You may see any of the following providers on your designated Care Team at your next follow up: Dr Arvilla Meres Dr Marca Ancona Dr. Marcos Eke, NP Robbie Lis, [place] [facility], [place] Brynda Peon, NP Karle Plumber, PharmD   Please be sure to bring in all your medications bottles to every appointment.    Thank you for choosing  [facility]  If you have any questions or concerns before your next appointment please send [place] a message through [place] or call our  office at [phone].    TO LEAVE A MESSAGE FOR THE NURSE SELECT OPTION 2, PLEASE LEAVE A MESSAGE INCLUDING: YOUR NAME DATE OF BIRTH CALL BACK NUMBER REASON FOR CALL**this is important as we prioritize the call backs  YOU WILL RECEIVE A CALL BACK THE SAME DAY AS LONG AS YOU CALL BEFORE 4:00 PM
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
## 2023-04-03 ENCOUNTER — Encounter: Payer: Self-pay | Admitting: Physician Assistant

## 2023-04-03 ENCOUNTER — Telehealth (HOSPITAL_COMMUNITY): Payer: Self-pay

## 2023-04-03 ENCOUNTER — Ambulatory Visit: Payer: Medicare Other | Attending: Physician Assistant | Admitting: Physician Assistant

## 2023-04-03 ENCOUNTER — Other Ambulatory Visit (HOSPITAL_COMMUNITY): Payer: Self-pay

## 2023-04-03 VITALS — BP 124/68 | HR 74 | Ht 66.5 in | Wt 248.0 lb

## 2023-04-03 DIAGNOSIS — Z95810 Presence of automatic (implantable) cardiac defibrillator: Secondary | ICD-10-CM | POA: Diagnosis not present

## 2023-04-03 LAB — CUP PACEART INCLINIC DEVICE CHECK
Date Time Interrogation Session: 20240619180144
HighPow Impedance: 85 Ohm
Implantable Lead Connection Status: 753985
Implantable Lead Implant Date: 20220909
Implantable Lead Location: 753860
Implantable Lead Model: 137
Implantable Lead Serial Number: 301248
Implantable Pulse Generator Implant Date: 20220909
Lead Channel Impedance Value: 767 Ohm
Lead Channel Pacing Threshold Amplitude: 0.8 V
Lead Channel Pacing Threshold Pulse Width: 0.4 ms
Lead Channel Sensing Intrinsic Amplitude: 25 mV
Lead Channel Setting Pacing Amplitude: 3.5 V
Lead Channel Setting Pacing Pulse Width: 0.4 ms
Lead Channel Setting Sensing Sensitivity: 0.6 mV
Pulse Gen Serial Number: 213174
Zone Setting Status: 755011

## 2023-04-03 MED ORDER — ICOSAPENT ETHYL 1 G PO CAPS
2.0000 g | ORAL_CAPSULE | Freq: Two times a day (BID) | ORAL | 3 refills | Status: DC
  Filled 2023-04-03: qty 120, 30d supply, fill #0
  Filled 2023-05-10 – 2023-07-10 (×2): qty 120, 30d supply, fill #1

## 2023-04-03 NOTE — Patient Instructions (Signed)
Medication Instructions:   Your physician recommends that you continue on your current medications as directed. Please refer to the Current Medication list given to you today.  *If you need a refill on your cardiac medications before your next appointment, please call your pharmacy*   Lab Work:  NONE ORDERED  TODAY    If you have labs (blood work) drawn today and your tests are completely normal, you will receive your results only by: MyChart Message (if you have MyChart) OR A paper copy in the mail If you have any lab test that is abnormal or we need to change your treatment, we will call you to review the results.   Testing/Procedures: NONE ORDERED  TODAY      Follow-Up: At [facility], you and your health needs are our priority.  As part of our continuing mission to provide you with exceptional heart care, we have created designated Provider Care Teams.  These Care Teams include your primary Cardiologist (physician) and Advanced Practice Providers (APPs -  Physician Assistants and Nurse Practitioners) who all work together to provide you with the care you need, when you need it.  We recommend signing up for the patient portal called "MyChart".  Sign up information is provided on this After Visit Summary.  MyChart is used to connect with patients for Virtual Visits (Telemedicine).  Patients are able to view lab/test results, encounter notes, upcoming appointments, etc.  Non-urgent messages can be sent to your provider as well.   To learn more about what you can do with MyChart, go to [email or web address].    Your next appointment:   1 year(s)  Provider:   Gregg Taylor, MD    Other Instructions  
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
## 2023-04-03 NOTE — Telephone Encounter (Addendum)
Pt aware, agreeable, and verbalized understanding States US taking digoxin daily.  Rx sent   ----- Message from Jacklynn Ganong, FNP sent at 04/02/2023  2:48 PM EDT ----- TGs are elevated. With his cardiac history, advise starting Vascepa 2G bid to reduced TGs. Also reduced refined carbs, sugars in diet in attempts to drive this number down.

## 2023-04-16 ENCOUNTER — Encounter: Payer: Self-pay | Admitting: Family Medicine

## 2023-04-16 ENCOUNTER — Ambulatory Visit: Payer: Medicare Other | Admitting: Internal Medicine

## 2023-04-19 NOTE — Progress Notes (Signed)
Remote ICD transmission.   

## 2023-05-10 ENCOUNTER — Other Ambulatory Visit (HOSPITAL_COMMUNITY): Payer: Self-pay | Admitting: Adult Health

## 2023-05-10 ENCOUNTER — Other Ambulatory Visit (HOSPITAL_COMMUNITY): Payer: Self-pay

## 2023-05-10 DIAGNOSIS — I255 Ischemic cardiomyopathy: Secondary | ICD-10-CM

## 2023-05-10 MED ORDER — CARVEDILOL 6.25 MG PO TABS
6.2500 mg | ORAL_TABLET | Freq: Two times a day (BID) | ORAL | 3 refills | Status: DC
Start: 2023-05-10 — End: 2023-12-11
  Filled 2023-05-10 – 2023-07-10 (×2): qty 180, 90d supply, fill #0
  Filled 2023-08-08 – 2023-10-23 (×2): qty 180, 90d supply, fill #1

## 2023-05-10 MED ORDER — SPIRONOLACTONE 25 MG PO TABS
25.0000 mg | ORAL_TABLET | Freq: Every day | ORAL | 3 refills | Status: DC
  Filled 2023-05-10 – 2023-07-10 (×2): qty 90, 90d supply, fill #0
  Filled 2023-08-08 – 2023-10-23 (×2): qty 90, 90d supply, fill #1

## 2023-05-13 ENCOUNTER — Other Ambulatory Visit: Payer: Self-pay

## 2023-05-13 ENCOUNTER — Other Ambulatory Visit (HOSPITAL_COMMUNITY): Payer: Self-pay

## 2023-05-23 ENCOUNTER — Other Ambulatory Visit (HOSPITAL_COMMUNITY): Payer: Self-pay

## 2023-07-01 ENCOUNTER — Ambulatory Visit (INDEPENDENT_AMBULATORY_CARE_PROVIDER_SITE_OTHER): Payer: Medicare Other

## 2023-07-01 DIAGNOSIS — I255 Ischemic cardiomyopathy: Secondary | ICD-10-CM | POA: Diagnosis not present

## 2023-07-01 DIAGNOSIS — I5022 Chronic systolic (congestive) heart failure: Secondary | ICD-10-CM

## 2023-07-10 ENCOUNTER — Other Ambulatory Visit (HOSPITAL_COMMUNITY): Payer: Self-pay

## 2023-07-10 ENCOUNTER — Other Ambulatory Visit: Payer: Self-pay

## 2023-07-10 ENCOUNTER — Other Ambulatory Visit (HOSPITAL_COMMUNITY): Payer: Self-pay | Admitting: Cardiology

## 2023-07-11 ENCOUNTER — Other Ambulatory Visit (HOSPITAL_COMMUNITY): Payer: Self-pay

## 2023-07-11 MED ORDER — CLOPIDOGREL BISULFATE 75 MG PO TABS
75.0000 mg | ORAL_TABLET | Freq: Every day | ORAL | 0 refills | Status: DC
  Filled 2023-07-11: qty 90, 90d supply, fill #0
  Filled 2023-07-11: qty 30, 30d supply, fill #0
  Filled 2023-07-11: qty 90, 90d supply, fill #0

## 2023-07-15 NOTE — Progress Notes (Signed)
Remote ICD transmission.   

## 2023-07-21 ENCOUNTER — Encounter (HOSPITAL_COMMUNITY): Payer: Self-pay | Admitting: Ophthalmology

## 2023-07-23 ENCOUNTER — Encounter (HOSPITAL_COMMUNITY): Payer: Self-pay | Admitting: Ophthalmology

## 2023-07-23 ENCOUNTER — Other Ambulatory Visit: Payer: Self-pay

## 2023-07-23 NOTE — Progress Notes (Signed)
Mr. Claytor denies chest pain or shortness of breath. Patient denies having any s/s of Covid in household, also denies any known exposure to Covid. Mr. Eden denies  any s/s of upper or lower respiratory infection in the past 8 weeks.   Mr. Hinzman PCP is Kathlen Brunswick, cardiologist- Dr. Shirlee Latch - CHF, EP - Dr Lewayne Bunting.  Mr. Lynch was not given any instructions on Medications, so he did not take any today.   I requested ICD periop orders after 1800, I'll ask charge nurse to follow up.

## 2023-07-24 ENCOUNTER — Ambulatory Visit (HOSPITAL_COMMUNITY): Payer: Medicare Other | Admitting: Anesthesiology

## 2023-07-24 ENCOUNTER — Ambulatory Visit (HOSPITAL_COMMUNITY)
Admission: RE | Admit: 2023-07-24 | Discharge: 2023-07-24 | Disposition: A | Discharge status: Home / Self Care | Payer: Medicare Other | Attending: Ophthalmology | Admitting: Ophthalmology

## 2023-07-24 ENCOUNTER — Encounter (HOSPITAL_COMMUNITY): Payer: Self-pay | Admitting: Ophthalmology

## 2023-07-24 ENCOUNTER — Encounter (HOSPITAL_COMMUNITY)
Admission: RE | Disposition: A | Discharge status: Home / Self Care | Payer: Self-pay | Source: Home / Self Care | Attending: Ophthalmology

## 2023-07-24 ENCOUNTER — Encounter (HOSPITAL_COMMUNITY): Payer: Self-pay

## 2023-07-24 ENCOUNTER — Ambulatory Visit (HOSPITAL_BASED_OUTPATIENT_CLINIC_OR_DEPARTMENT_OTHER): Payer: Medicare Other | Admitting: Anesthesiology

## 2023-07-24 ENCOUNTER — Other Ambulatory Visit (HOSPITAL_COMMUNITY): Payer: Self-pay

## 2023-07-24 DIAGNOSIS — I509 Heart failure, unspecified: Secondary | ICD-10-CM | POA: Diagnosis not present

## 2023-07-24 DIAGNOSIS — E7800 Pure hypercholesterolemia, unspecified: Secondary | ICD-10-CM | POA: Diagnosis not present

## 2023-07-24 DIAGNOSIS — Z955 Presence of coronary angioplasty implant and graft: Secondary | ICD-10-CM | POA: Insufficient documentation

## 2023-07-24 DIAGNOSIS — E669 Obesity, unspecified: Secondary | ICD-10-CM | POA: Diagnosis not present

## 2023-07-24 DIAGNOSIS — I252 Old myocardial infarction: Secondary | ICD-10-CM | POA: Diagnosis not present

## 2023-07-24 DIAGNOSIS — H532 Diplopia: Secondary | ICD-10-CM | POA: Diagnosis not present

## 2023-07-24 DIAGNOSIS — I110 Hypertensive heart disease with heart failure: Secondary | ICD-10-CM | POA: Insufficient documentation

## 2023-07-24 DIAGNOSIS — Z87891 Personal history of nicotine dependence: Secondary | ICD-10-CM | POA: Diagnosis not present

## 2023-07-24 DIAGNOSIS — Z79899 Other long term (current) drug therapy: Secondary | ICD-10-CM | POA: Diagnosis not present

## 2023-07-24 DIAGNOSIS — H5000 Unspecified esotropia: Secondary | ICD-10-CM | POA: Diagnosis present

## 2023-07-24 DIAGNOSIS — Z951 Presence of aortocoronary bypass graft: Secondary | ICD-10-CM | POA: Insufficient documentation

## 2023-07-24 DIAGNOSIS — Z6838 Body mass index (BMI) 38.0-38.9, adult: Secondary | ICD-10-CM | POA: Diagnosis not present

## 2023-07-24 DIAGNOSIS — I2510 Atherosclerotic heart disease of native coronary artery without angina pectoris: Secondary | ICD-10-CM | POA: Insufficient documentation

## 2023-07-24 DIAGNOSIS — H4921 Sixth [abducent] nerve palsy, right eye: Secondary | ICD-10-CM | POA: Diagnosis not present

## 2023-07-24 DIAGNOSIS — Z95810 Presence of automatic (implantable) cardiac defibrillator: Secondary | ICD-10-CM | POA: Diagnosis not present

## 2023-07-24 HISTORY — DX: Headache, unspecified: R51.9

## 2023-07-24 HISTORY — PX: MUSCLE RECESSION AND RESECTION: SHX5209

## 2023-07-24 HISTORY — DX: Acute myocardial infarction, unspecified: I21.9

## 2023-07-24 LAB — BASIC METABOLIC PANEL
Anion gap: 13 (ref 5–15)
BUN: 11 mg/dL (ref 6–20)
CO2: 21 mmol/L — ABNORMAL LOW (ref 22–32)
Calcium: 9.1 mg/dL (ref 8.9–10.3)
Chloride: 101 mmol/L (ref 98–111)
Creatinine, Ser: 1.01 mg/dL (ref 0.61–1.24)
GFR, Estimated: >60 mL/min (ref >60)
Glucose, Bld: 104 mg/dL — ABNORMAL HIGH (ref 70–99)
Potassium: 3.2 mmol/L — ABNORMAL LOW (ref 3.5–5.1)
Sodium: 135 mmol/L (ref 135–145)

## 2023-07-24 LAB — CBC
HCT: 51.7 % (ref 39.0–52.0)
Hemoglobin: 17.5 g/dL — ABNORMAL HIGH (ref 13.0–17.0)
MCH: 29.3 pg (ref 26.0–34.0)
MCHC: 33.8 g/dL (ref 30.0–36.0)
MCV: 86.5 fL (ref 80.0–100.0)
Platelets: 379 10*3/uL (ref 150–400)
RBC: 5.98 MIL/uL — ABNORMAL HIGH (ref 4.22–5.81)
RDW: 13.5 % (ref 11.5–15.5)
WBC: 9.7 10*3/uL (ref 4.0–10.5)
nRBC: 0 % (ref 0.0–0.2)

## 2023-07-24 SURGERY — MUSCLE RECESSION/RESECTION
Anesthesia: General | Laterality: Right

## 2023-07-24 MED ORDER — LIDOCAINE 2% (20 MG/ML) 5 ML SYRINGE
INTRAMUSCULAR | Status: DC | PRN
  Administered 2023-07-24: 60 mg via INTRAVENOUS

## 2023-07-24 MED ORDER — PHENYLEPHRINE HCL-NACL 20-0.9 MG/250ML-% IV SOLN
INTRAVENOUS | Status: DC | PRN
  Administered 2023-07-24: 30 ug/min via INTRAVENOUS

## 2023-07-24 MED ORDER — ONDANSETRON HCL 4 MG/2ML IJ SOLN: 4.0000 mg | Freq: Once | INTRAMUSCULAR | Status: DC | PRN

## 2023-07-24 MED ORDER — DEXMEDETOMIDINE HCL IN NACL 80 MCG/20ML IV SOLN
INTRAVENOUS | Status: DC | PRN
Start: 2023-07-24 — End: 2023-07-24
  Administered 2023-07-24: 12 ug via INTRAVENOUS
  Administered 2023-07-24: 8 ug via INTRAVENOUS

## 2023-07-24 MED ORDER — TOBRAMYCIN-DEXAMETHASONE 0.3-0.1 % OP OINT
TOPICAL_OINTMENT | OPHTHALMIC | Status: AC
  Filled 2023-07-24: qty 3.5

## 2023-07-24 MED ORDER — DEXAMETHASONE SODIUM PHOSPHATE 10 MG/ML IJ SOLN
INTRAMUSCULAR | Status: DC | PRN
  Administered 2023-07-24: 10 mg via INTRAVENOUS

## 2023-07-24 MED ORDER — PROPOFOL 10 MG/ML IV BOLUS
INTRAVENOUS | Status: AC
  Filled 2023-07-24: qty 20

## 2023-07-24 MED ORDER — PHENYLEPHRINE HCL 2.5 % OP SOLN
OPHTHALMIC | Status: AC
  Filled 2023-07-24: qty 2

## 2023-07-24 MED ORDER — AMISULPRIDE (ANTIEMETIC) 5 MG/2ML IV SOLN: 10.0000 mg | Freq: Once | INTRAVENOUS | Status: DC | PRN

## 2023-07-24 MED ORDER — BSS IO SOLN
INTRAOCULAR | Status: DC | PRN
  Administered 2023-07-24: 15 mL via INTRAOCULAR

## 2023-07-24 MED ORDER — SODIUM CHLORIDE 0.9 % IV SOLN: INTRAVENOUS | Status: DC

## 2023-07-24 MED ORDER — ACETAMINOPHEN 500 MG PO TABS
1000.0000 mg | ORAL_TABLET | Freq: Once | ORAL | Status: AC
  Administered 2023-07-24: 1000 mg via ORAL
  Filled 2023-07-24: qty 2

## 2023-07-24 MED ORDER — ACETAMINOPHEN-CODEINE 300-30 MG PO TABS
1.0000 | ORAL_TABLET | ORAL | 0 refills | Status: DC | PRN
Start: 2023-07-24 — End: 2023-12-11

## 2023-07-24 MED ORDER — CHLORHEXIDINE GLUCONATE 0.12 % MT SOLN
15.0000 mL | OROMUCOSAL | Status: AC
  Administered 2023-07-24: 15 mL via OROMUCOSAL
  Filled 2023-07-24 (×2): qty 15

## 2023-07-24 MED ORDER — ONDANSETRON HCL 4 MG/2ML IJ SOLN
INTRAMUSCULAR | Status: AC
  Filled 2023-07-24: qty 2

## 2023-07-24 MED ORDER — FENTANYL CITRATE (PF) 250 MCG/5ML IJ SOLN
INTRAMUSCULAR | Status: DC | PRN
  Administered 2023-07-24: 50 ug via INTRAVENOUS
  Administered 2023-07-24: 25 ug via INTRAVENOUS
  Administered 2023-07-24: 100 ug via INTRAVENOUS

## 2023-07-24 MED ORDER — PHENYLEPHRINE HCL 2.5 % OP SOLN
OPHTHALMIC | Status: DC | PRN
  Administered 2023-07-24: 3 [drp] via OPHTHALMIC

## 2023-07-24 MED ORDER — FENTANYL CITRATE (PF) 250 MCG/5ML IJ SOLN
INTRAMUSCULAR | Status: AC
  Filled 2023-07-24: qty 5

## 2023-07-24 MED ORDER — HYDROMORPHONE HCL 1 MG/ML IJ SOLN: 0.2500 mg | INTRAMUSCULAR | Status: DC | PRN

## 2023-07-24 MED ORDER — OXYCODONE HCL 5 MG/5ML PO SOLN: 5.0000 mg | Freq: Once | ORAL | Status: DC | PRN

## 2023-07-24 MED ORDER — PHENYLEPHRINE HCL (PRESSORS) 10 MG/ML IV SOLN
INTRAVENOUS | Status: DC | PRN
Start: 2023-07-24 — End: 2023-07-24
  Administered 2023-07-24: 160 ug via INTRAVENOUS

## 2023-07-24 MED ORDER — BSS IO SOLN
INTRAOCULAR | Status: AC
  Filled 2023-07-24: qty 15

## 2023-07-24 MED ORDER — DEXAMETHASONE SODIUM PHOSPHATE 10 MG/ML IJ SOLN
INTRAMUSCULAR | Status: AC
  Filled 2023-07-24: qty 1

## 2023-07-24 MED ORDER — PROPOFOL 10 MG/ML IV BOLUS
INTRAVENOUS | Status: DC | PRN
  Administered 2023-07-24: 40 mg via INTRAVENOUS
  Administered 2023-07-24: 200 mg via INTRAVENOUS

## 2023-07-24 MED ORDER — TOBRAMYCIN-DEXAMETHASONE 0.3-0.1 % OP SUSP
OPHTHALMIC | Status: AC
  Filled 2023-07-24: qty 2.5

## 2023-07-24 MED ORDER — OXYCODONE HCL 5 MG PO TABS: 5.0000 mg | ORAL_TABLET | Freq: Once | ORAL | Status: DC | PRN

## 2023-07-24 MED ORDER — MIDAZOLAM HCL 2 MG/2ML IJ SOLN
INTRAMUSCULAR | Status: AC
  Filled 2023-07-24: qty 2

## 2023-07-24 MED ORDER — MIDAZOLAM HCL 2 MG/2ML IJ SOLN
INTRAMUSCULAR | Status: DC | PRN
  Administered 2023-07-24: 2 mg via INTRAVENOUS

## 2023-07-24 MED ORDER — TOBRADEX 0.3-0.1 % OP OINT
1.0000 | TOPICAL_OINTMENT | Freq: Two times a day (BID) | OPHTHALMIC | 0 refills | Status: DC
  Filled 2023-07-24: qty 3.5, 7d supply, fill #0

## 2023-07-24 MED ORDER — TETRACAINE HCL 0.5 % OP SOLN
OPHTHALMIC | Status: AC
  Filled 2023-07-24: qty 4

## 2023-07-24 MED ORDER — ONDANSETRON HCL 4 MG/2ML IJ SOLN
INTRAMUSCULAR | Status: DC | PRN
  Administered 2023-07-24: 4 mg via INTRAVENOUS

## 2023-07-24 SURGICAL SUPPLY — 31 items
APL SRG 3 HI ABS STRL LF PLS (MISCELLANEOUS) ×1
APPLICATOR DR MATTHEWS STRL (MISCELLANEOUS) ×1 IMPLANT
BAG COUNTER SPONGE SURGICOUNT (BAG) ×1 IMPLANT
BAG SPNG CNTER NS LX DISP (BAG) ×1
BAND WRIST GAS GREEN (MISCELLANEOUS) IMPLANT
BLADE SURG 15 STRL LF DISP TIS (BLADE) IMPLANT
BLADE SURG 15 STRL SS (BLADE)
BNDG GZE 12X3 1 PLY HI ABS (GAUZE/BANDAGES/DRESSINGS)
BNDG STRETCH GAUZE 3IN X12FT (GAUZE/BANDAGES/DRESSINGS) IMPLANT
CAUTERY EYE LOW TEMP 1300F FIN (OPHTHALMIC RELATED) ×1 IMPLANT
CAUTERY EYE LOW TEMP OLD (MISCELLANEOUS) ×1 IMPLANT
CORD BIPOLAR FORCEPS 12FT (ELECTRODE) IMPLANT
COVER MAYO STAND STRL (DRAPES) IMPLANT
COVER SURGICAL LIGHT HANDLE (MISCELLANEOUS) ×1 IMPLANT
DRAPE HALF SHEET 40X57 (DRAPES) ×1 IMPLANT
DRAPE SURG 17X23 STRL (DRAPES) ×3 IMPLANT
GAUZE SPONGE 4X4 12PLY STRL (GAUZE/BANDAGES/DRESSINGS) ×1 IMPLANT
GLOVE SURG PR MICRO ENCORE 7.5 (GLOVE) ×1 IMPLANT
GOWN STRL REUS W/ TWL LRG LVL3 (GOWN DISPOSABLE) ×2 IMPLANT
GOWN STRL REUS W/TWL LRG LVL3 (GOWN DISPOSABLE) ×2
KIT TURNOVER KIT B (KITS) ×1 IMPLANT
MARKER SKIN DUAL TIP RULER LAB (MISCELLANEOUS) IMPLANT
NS IRRIG 1000ML POUR BTL (IV SOLUTION) ×1 IMPLANT
PACK CATARACT CUSTOM (CUSTOM PROCEDURE TRAY) ×1 IMPLANT
PAD ARMBOARD 7.5X6 YLW CONV (MISCELLANEOUS) ×1 IMPLANT
POSITIONER HEAD DONUT 9IN (MISCELLANEOUS) ×1 IMPLANT
STRIP CLOSURE SKIN 1/2X4 (GAUZE/BANDAGES/DRESSINGS) ×1 IMPLANT
SUT VICRYL 6 0 S 29 12 (SUTURE) ×1 IMPLANT
SUT VICRYL 7 0 TG140 8 (SUTURE) IMPLANT
SUT VICRYL 8 0 TG140 8 (SUTURE) IMPLANT
TOWEL GREEN STERILE (TOWEL DISPOSABLE) ×1 IMPLANT

## 2023-07-24 NOTE — Interval H&P Note (Signed)
History and Physical Interval Note:  07/24/2023 10:12 AM  [name]  has presented today for surgery, with the diagnosis of ESOTROPIA DIPLOPIA.  The various methods of treatment have been discussed with the patient and family. After consideration of risks, benefits and other options for treatment, the patient has consented to  Procedure(s): RIGHT MEDIAL RECTUS RECESSION; RIGHT LATERAL RECTUS RESECTION WITH RIGHT LATERAL RECTUS ADJUSTABLE SUTURES (Right) as a surgical intervention.  The patient's history has been reviewed, patient examined, no change in status, stable for surgery.  I have reviewed the patient's chart and labs.  Questions were answered to the patient's satisfaction.     Aura Camps

## 2023-07-24 NOTE — H&P (Signed)
[name] is an 42 y.o. male.   Chief Complaint: I have constant double vision and would like to have this fixed. HPI: 42 y.o. WM c  extensive Cardiovascular  hx : s/p CABG : cardiac defibrillator :HTN , Hypercholesterolemia  and s/p  traumatic SDH presents for elective repair of Right Esotropia and incalcitrant diplopia .  Past Medical History:  Diagnosis Date   AICD (automatic cardioverter/defibrillator) present    CAD (coronary artery disease)    inferiot MI; LHC with EF 55% and basal to mid inferior hypokinesis. totally occluded RCA w/weak L-R collaterals. 50% ostial OM1. long 70% proximal LAD. Pt had promus DES to LAD and RCA. 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 1 SEQ TO OM1, SVG TO OM2, SVG TO ACUTE MARGINAL;  Surgeon: Linden Dolin, MD;  [facility]: [facility];  Service: Open Heart Surgery;  Laterality: N/A;   ENDOVEIN HARVEST OF GREATER SAPHENOUS VEIN Right 06/24/2020   Procedure: ENDOVEIN HARVEST OF GREATER SAPHENOUS VEIN;  Surgeon: Linden Dolin, MD;  [facility]: [facility] OR;  Service: Open Heart Surgery;  Laterality: Right;   IABP INSERTION N/A 06/23/2020   Procedure: IABP Insertion;  Surgeon: Yvonne Kendall, MD;  [facility] INVASIVE CV LAB;  Service: Cardiovascular;  Laterality: N/A;   ICD IMPLANT N/A 06/23/2021   Procedure: ICD IMPLANT;  Surgeon: Marinus Maw, MD;  Location: [facility] INVASIVE CV LAB;  Service: Cardiovascular;  Laterality: N/A;   LEFT HEART CATH AND CORONARY ANGIOGRAPHY N/A 06/23/2020   Procedure: LEFT HEART CATH AND CORONARY ANGIOGRAPHY;  Surgeon: Yvonne Kendall, MD;  [facility] INVAS[facility] CV LAB;  Service: Cardiovascular;  Laterality: N/A;   patients states had two stent surgery to his heart  03/30/2010   RADIAL ARTERY HARVEST Left 06/24/2020   Procedure: RADIAL ARTERY HARVEST;  Surgeon: Linden Dolin, MD;  [facility]: [facility] OR;  Service: Open Heart Surgery;  Laterality: Left;   RIGHT HEART CATH N/A 05/11/2021   Procedure: RIGHT HEART CATH;  Surgeon: Laurey Morale, MD;  Location: [facility] INVASIVE CV LAB;  Service: Cardiovascular;  Laterality: N/A;   TEE WITHOUT CARDIOVERSION N/A 06/24/2020   Procedure: TRANSESOPHAGEAL ECHOCARDIOGRAM (TEE);  Surgeon: Linden Dolin, MD;  Location: [facility] OR;  Service: Open Heart Surgery;  Laterality: N/A;    Family History  Problem Relation Age of Onset   Rheum arthritis Mother    Diabetes Mother    Hyperlipidemia Mother    Hypertension Mother    Heart disease Father    Heart attack Other        6 way bypass   Stroke Other    Coronary artery disease Other        noncontributory for early CAD and MI   Stroke Other    Social History:  reports that he  has quit smoking. His smoking use included cigarettes. He has never used smokeless tobacco. He reports current alcohol use. He reports that he does not use drugs.  Allergies:  Allergies  Allergen Reactions   Penicillins Anaphylaxis, Swelling and Rash    "Rash from head to toe, throat closed up" with amoxicillin at 42 years old  PCN reaction causing immediate rash, facial/tongue/throat swelling, SOB or lightheadedness with hypotension: Yes Has patient had a PCN reaction causing severe rash involving mucus membranes or skin necrosis: Yes Has patient had a PCN reaction that required hospitalization: No Has patient had a PCN reaction occurring  within the last 10 years: No If all of the above answers are "NO", then may proceed with Cephalosporin use.    Medications Prior to Admission  Medication Sig Dispense Refill   aspirin-acetaminophen-caffeine (EXCEDRIN MIGRAINE) 250-250-65 MG tablet Take 2 tablets by mouth every 6 (six) hours as needed for headache or migraine.     carvedilol (COREG) 6.25 MG tablet Take 1 tablet (6.25 mg total) by mouth 2 (two) times daily. 180 tablet 3   clopidogrel (PLAVIX) 75 MG tablet Take 1 tablet (75 mg total) by mouth daily. 90 tablet 0   dapagliflozin propanediol (FARXIGA) 10 MG TABS tablet Take 1 tablet (10 mg total) by mouth daily before breakfast. 30 tablet 6   digoxin (LANOXIN) 0.125 MG tablet Take 1 tablet (0.125 mg total) by mouth daily. 90 tablet 3   ezetimibe (ZETIA) 10 MG tablet Take 1 tablet (10 mg total) by mouth daily. 90 tablet 0   gabapentin (NEURONTIN) 300 MG capsule Take 1 capsule (300 mg total) by mouth 3 (three) times daily. 90 capsule 0   icosapent Ethyl (VASCEPA) 1 g capsule Take 2 capsules (2 g total) by mouth 2 (two) times daily. 180 capsule 3   LORazepam (ATIVAN) 0.5 MG tablet Take 0.5 mg by mouth 2 (two) times daily as needed for anxiety.     potassium chloride SA (KLOR-CON M) 20 MEQ tablet Take 1 tablet (20 mEq total) by mouth as needed. Take when taking Torsemide as needed for weight gain of 3 lbs in a day or 5 lbs in a week 30 tablet 6   rosuvastatin (CRESTOR) 40 MG tablet Take 1 tablet (40 mg total) by mouth daily. 90 tablet 3   sacubitril-valsartan (ENTRESTO) 97-103 MG Take 1 tablet by mouth 2 (two) times daily. 60 tablet 11   spironolactone (ALDACTONE) 25 MG tablet Take 1 tablet (25 mg total) by mouth at bedtime. 90 tablet 3   torsemide (DEMADEX) 20 MG tablet Take 1 tablet (20 mg total) by mouth as needed. For weight gain 3 lbs in a day or 5 lbs in a week 30 tablet 6   traZODone (DESYREL) 50 MG tablet Take 1/2-1 tablet (25-50 mg total) by mouth at bedtime as needed for sleep 60  tablet 0    Results for orders placed or performed during the hospital encounter of 07/24/23 (from the past 48 hour(s))  CBC     Status: Abnormal   Collection Time: 07/24/23  8:33 AM  Result Value Ref Range   WBC 9.7 4.0 - 10.5 K/uL   RBC 5.98 (H) 4.22 - 5.81 MIL/uL   Hemoglobin 17.5 (H) 13.0 - 17.0 g/dL   HCT 08.6 57.8 - 46.9 %   MCV 86.5 80.0 - 100.0 fL   MCH 29.3 26.0 - 34.0 pg   MCHC 33.8 30.0 - 36.0 g/dL   RDW 62.9 52.8 - 41.3 %  Platelets 379 150 - 400 K/uL   nRBC 0.0 0.0 - 0.2 %    Comment: Performed at [facility], [address], [place], [place] [address]  Basic metabolic panel     Status: Abnormal   Collection Time: 07/24/23  8:33 AM  Result Value Ref Range   Sodium 135 135 - 145 mmol/L   Potassium 3.2 (L) 3.5 - 5.1 mmol/L   Chloride 101 98 - 111 mmol/L   CO2 21 (L) 22 - 32 mmol/L   Glucose, Bld 104 (H) 70 - 99 mg/dL    Comment: Glucose reference range applies only to samples taken after fasting for at least 8 hours.   BUN 11 6 - 20 mg/dL   Creatinine, Ser 1.91 0.61 - 1.24 mg/dL   Calcium 9.1 8.9 - 47.8 mg/dL   GFR, Estimated >29 >56 mL/min    Comment: (NOTE) Calculated using the CKD-EPI Creatinine Equation (2021)    Anion gap 13 5 - 15    Comment: Performed at [facility], [address], [place], [place] [address]   No results found.  Review of Systems  Constitutional: Negative.   HENT: Negative.    Eyes:        Esotropia od   Cardiovascular:        Extensive hx controlled c current interventions.  Gastrointestinal: Negative.   Endocrine: Negative.   Musculoskeletal: Negative.   Allergic/Immunologic: Negative.   Neurological: Negative.   Psychiatric/Behavioral: Negative.      Blood pressure (!) 148/96, pulse 82, temperature 98.5 F (36.9 C), temperature source Oral, resp. rate 20, height 5\' 6"  (1.676 m), weight 109.3 kg, SpO2 96%. Physical Exam Constitutional:      Appearance: Normal appearance.  HENT:     Head: Normocephalic and  atraumatic.  Eyes:     Extraocular Movements: Extraocular movements intact.     Pupils: Pupils are equal, round, and reactive to light.   Cardiovascular:     Comments: See HPI. Pulmonary:     Effort: Pulmonary effort is normal.  Musculoskeletal:        General: Normal range of motion.     Cervical back: Normal range of motion.  Neurological:     General: No focal deficit present.     Mental Status: He is alert and oriented to person, place, and time.  Psychiatric:        Behavior: Behavior normal.      Assessment/Plan Esotropia /Diplopia  Plan:  RMR recess: RLR recession : AS @ RMR under general anesthesia.  Aura Camps, MD 07/24/2023, 10:11 AM

## 2023-07-24 NOTE — Op Note (Unsigned)
[name], MINICHIELLO MEDICAL RECORD NO: [id number] ACCOUNT NO: [id number] DATE OF BIRTH: 29-Mar-1981 FACILITY: [facility] LOCATION: [facility] PHYSICIAN: Tyrone Apple. Karleen Hampshire, MD  Operative Report   DATE OF PROCEDURE: 07/24/2023  PREOPERATIVE DIAGNOSES: 1.  Esotropia and diplopia. 2.  Right sixth nerve palsy.  PROCEDURE:  Right medial rectus recession of 5 mm, right lateral rectus resection  of 8 mm on adjustable sutures.  SURGEON:  Aura Camps, MD  ANESTHESIA:  General with a laryngeal mask airway.  INDICATIONS FOR PROCEDURE:  The patient is a 42 year old gentleman with a cardiovascular history who is status post multiple cardiac procedures and subsequently patient developed a right sixth nerve palsy resulting in diplopia.  This procedure was indicated to restore single binocular vision and restore alignment of the visual axis.  The risks and benefits of the procedure were explained to the patient. Prior to procedure, informed consent was obtained.  DESCRIPTION OF PROCEDURE:  The patient was taken into the operating room and placed in the supine position.  The entire face was prepped and draped in the usual sterile fashion.  My attention was first directed to the right eye. A Lid speculum was placed.   Forced duction tests were performed and found to be negative.  The globe was then held at the inferior nasal limbus and the eye was then abducted and elevated.  An incision was made through the inferior nasal fornix taken down to the posterior sub-tenons space and the right medial rectus tendon was then isolated on a Stevens hook subsequently on a green hook.  A second green hook was then passed beneath the tendon.  This was used to hold the globe in an elevated and abducted position.  Next,the tendon was then carefully dissected free from its overlying muscle fascia and intermuscular septae were transected and the tendon was then carefully imbricated on 6-0 Vicryl suture taking 2 locking bites at  the medial and temporal apices.  The tendon was then carefully dissected free from the globe and recessed at 5.70mm from its native insertion.  It was reattached to the globe in a recessed position.  Sutures tied securely.  The conjunctiva was repositioned.  My attention was then directed to the right lateral rectus tendon.  The globe was held at the inferior temporal limbus.  The eye was elevated and abducted.  An incision was made through the inferior temporal fornix taken down to the posterior sub-tenon space and the right lateral rectus tendon was then isolated on a Stevens hook subsequently on a green hook.  A second green hook was then passed beneath the tendon.  This was used to hold the globe in an elevated and abducted position.  Next, the tendon was then carefully dissected free from its overlying muscle fascia and intermuscular septae for a distance of approximately 9 mm.  A mark was then placed on the tendon at 8 mm from its native insertion and the tendon was then imbricated on 6-0 Vicryl suture at the preplaced mark taking two locking bites at the medial and temporal apices.  The tendon was then advanced to its native insertion site via plication, reattached in adjustable suture fashion.  The sutures were then deposited underneath the conjunctiva and a double pressure patch was applied.  The patient was then allowed to awaken from anesthesia for 30 minutes in the postanesthesia recovery unit and the patient was then subsequently adjusted to orthophoria under topical tetracaine anesthesia.  The suture was then tied securely permanently and the conjunctiva  was repositioned. At the conclusion of the procedure, TobraDex ointment was instilled in inferior fornices of the right eye.  There were no apparent complications.   SHY D: 07/24/2023 11:59:45 am T: 07/24/2023 12:24:00 pm  JOB: [id number]/ [id number]

## 2023-07-24 NOTE — Progress Notes (Signed)
Anesthesia made aware patient has ICD. Stated "will use a magnet" and "device rep not needed"

## 2023-07-24 NOTE — Anesthesia Procedure Notes (Signed)
Procedure Name: LMA Insertion Date/Time: 07/24/2023 10:52 AM  Performed by: Allyn Kenner, CRNAPre-anesthesia Checklist: Patient identified, Emergency Drugs available, Suction available and Patient being monitored Patient Re-evaluated:Patient Re-evaluated prior to induction Oxygen Delivery Method: Circle System Utilized Preoxygenation: Pre-oxygenation with 100% oxygen Induction Type: IV induction Ventilation: Mask ventilation without difficulty LMA: LMA inserted LMA Size: 5.0 Number of attempts: 1 Airway Equipment and Method: Bite block Placement Confirmation: positive ETCO2 Tube secured with: Tape Dental Injury: Teeth and Oropharynx as per pre-operative assessment

## 2023-07-24 NOTE — Interval H&P Note (Signed)
History and Physical Interval Note:  07/24/2023 10:22 AM  [name]  has presented today for surgery, with the diagnosis of ESOTROPIA DIPLOPIA.  The various methods of treatment have been discussed with the patient and family. After consideration of risks, benefits and other options for treatment, the patient has consented to  Procedure(s): RIGHT MEDIAL RECTUS RECESSION; RIGHT LATERAL RECTUS RESECTION WITH RIGHT LATERAL RECTUS ADJUSTABLE SUTURES (Right) as a surgical intervention.  The patient's history has been reviewed, patient examined, no change in status, stable for surgery.  I have reviewed the patient's chart and labs.  Questions were answered to the patient's satisfaction.     Aura Camps

## 2023-07-24 NOTE — Anesthesia Postprocedure Evaluation (Signed)
Anesthesia Post Note  Patient: [name]  Procedure(s) Performed: RIGHT MEDIAL RECTUS RECESSION; RIGHT LATERAL RECTUS RESECTION WITH RIGHT LATERAL RECTUS ADJUSTABLE SUTURES (Right)     Patient location during evaluation: PACU Anesthesia Type: General Level of consciousness: awake and alert, oriented and patient cooperative Pain management: pain level controlled Vital Signs Assessment: post-procedure vital signs reviewed and stable Respiratory status: spontaneous breathing, nonlabored ventilation and respiratory function stable Cardiovascular status: blood pressure returned to baseline and stable Postop Assessment: no apparent nausea or vomiting Anesthetic complications: no Comments: Significant emergence delirium requiring re-sedation   No notable events documented.  Last Vitals:  Vitals:   07/24/23 1315 07/24/23 1321  BP: 128/70 133/84  Pulse: (!) 57 60  Resp: 14 16  Temp:  36.5 C  SpO2: 95% 97%    Last Pain:  Vitals:   07/24/23 1300  TempSrc:   PainSc: 0-No pain                 Lannie Fields

## 2023-07-24 NOTE — Transfer of Care (Signed)
Immediate Anesthesia Transfer of Care Note  Patient: [name]  Procedure(s) Performed: RIGHT MEDIAL RECTUS RECESSION; RIGHT LATERAL RECTUS RESECTION WITH RIGHT LATERAL RECTUS ADJUSTABLE SUTURES (Right)  Patient Location: PACU  Anesthesia Type:General  Level of Consciousness: drowsy and pateint uncooperative  Airway & Oxygen Therapy: Patient Spontanous Breathing and Patient connected to face mask oxygen  Post-op Assessment: Report given to RN and Post -op Vital signs reviewed and stable  Post vital signs: Reviewed and stable  Last Vitals:  Vitals Value Taken Time  BP 122/71 07/24/23 1208  Temp    Pulse 67 07/24/23 1213  Resp 20 07/24/23 1212  SpO2 100 % 07/24/23 1213  Vitals shown include unfiled device data.  Last Pain:  Vitals:   07/24/23 0834  TempSrc:   PainSc: 0-No pain      Patients Stated Pain Goal: 0 (07/24/23 0834)  Complications: No notable events documented.

## 2023-07-24 NOTE — Discharge Instructions (Signed)
Advance to PO as tolerated . Cold compressses od Q5 ' as  tolerated D/C IVF when PO intake adequate. D/C to home post VSS. Tobradex ointment od BID . Tylenol # 3 : 1 -2 po q4 hrs prn pain. F/U  @ [facility] x 1 wk.

## 2023-07-24 NOTE — Brief Op Note (Signed)
07/24/2023  11:52 AM  PATIENT:  [name]  42 y.o. male  PRE-OPERATIVE DIAGNOSIS:  ESOTROPIA DIPLOPIA  POST-OPERATIVE DIAGNOSIS:  ESOTROPIADIPLOPIA  PROCEDURE:  Procedure(s): RIGHT MEDIAL RECTUS RECESSION; RIGHT LATERAL RECTUS RESECTION WITH RIGHT LATERAL RECTUS ADJUSTABLE SUTURES (Right)  SURGEON:  Surgeons and Role:    Aura Camps, MD - Primary  PHYSICIAN ASSISTANT:   ASSISTANTS: none   ANESTHESIA:   general  EBL:    None BLOOD ADMINISTERED:none  DRAINS: none   LOCAL MEDICATIONS USED:  NONE  SPECIMEN:  No Specimen  DISPOSITION OF SPECIMEN:  N/A  COUNTS:  YES  TOURNIQUET:  * No tourniquets in log *  DICTATION: .Other Dictation: Dictation Number [id number]  PLAN OF CARE: Discharge to home after PACU  PATIENT DISPOSITION:  PACU - hemodynamically stable.   Delay start of Pharmacological VTE agent (>24hrs) due to surgical blood loss or risk of bleeding: not applicable

## 2023-07-24 NOTE — Anesthesia Preprocedure Evaluation (Addendum)
Anesthesia Evaluation  Patient identified by MRN, date of birth, ID band Patient awake    Reviewed: Allergy & Precautions, NPO status , Patient's Chart, lab work & pertinent test results, reviewed documented beta blocker date and time   Airway Mallampati: III  TM Distance: >3 FB Neck ROM: Full    Dental  (+) Dental Advisory Given, Chipped   Pulmonary former smoker   Pulmonary exam normal breath sounds clear to auscultation       Cardiovascular hypertension (148/96 preop), Pt. on medications and Pt. on home beta blockers + CAD, + Past MI, + Cardiac Stents, + CABG (2021 CABG x 6) and +CHF (LVEF 30%)  Normal cardiovascular exam+ Cardiac Defibrillator  Rhythm:Regular Rate:Normal  Echo 2023  1. Left ventricular ejection fraction, by estimation, is 30%. The left  ventricle has moderate to severely decreased function. The left ventricle  demonstrates regional wall motion abnormalities with mid to apical  anteroseptal and mid to apical anterior  akinesis, akinesis of the apical lateral wall, and akinesis of the true  apex. No LV thrombus visualized. The left ventricular internal cavity size  was mildly dilated. Left ventricular diastolic parameters are consistent  with Grade I diastolic dysfunction   (impaired relaxation).   2. Right ventricular systolic function is normal. The right ventricular  size is normal. Tricuspid regurgitation signal is inadequate for assessing  PA pressure.   3. The mitral valve is normal in structure. Trivial mitral valve  regurgitation. No evidence of mitral stenosis.   4. The aortic valve is tricuspid. Aortic valve regurgitation is not  visualized. No aortic stenosis is present.   5. The inferior vena cava is normal in size with greater than 50%  respiratory variability, suggesting right atrial pressure of 3 mmHg.     Neuro/Psych  Headaches  negative psych ROS   GI/Hepatic negative GI ROS, Neg liver  ROS,,,  Endo/Other  Obesity BMI 38  Renal/GU negative Renal ROS  negative genitourinary   Musculoskeletal negative musculoskeletal ROS (+)    Abdominal  (+) + obese  Peds  Hematology negative hematology ROS (+)   Anesthesia Other Findings   Reproductive/Obstetrics negative OB ROS                             Anesthesia Physical Anesthesia Plan  ASA: 4  Anesthesia Plan: General   Post-op Pain Management: Tylenol PO (pre-op)*   Induction: Intravenous  PONV Risk Score and Plan: 2 and Ondansetron, Dexamethasone, Midazolam and Treatment may vary due to age or medical condition  Airway Management Planned: LMA  Additional Equipment: ClearSight  Intra-op Plan:   Post-operative Plan: Extubation in OR  Informed Consent: I have reviewed the patients History and Physical, chart, labs and discussed the procedure including the risks, benefits and alternatives for the proposed anesthesia with the patient or authorized representative who has indicated his/her understanding and acceptance.     Dental advisory given  Plan Discussed with: CRNA  Anesthesia Plan Comments:        Anesthesia Quick Evaluation

## 2023-07-25 ENCOUNTER — Other Ambulatory Visit (HOSPITAL_COMMUNITY): Payer: Self-pay

## 2023-07-25 ENCOUNTER — Encounter: Payer: Self-pay | Admitting: Internal Medicine

## 2023-07-25 ENCOUNTER — Encounter (HOSPITAL_COMMUNITY): Payer: Self-pay | Admitting: Ophthalmology

## 2023-07-25 NOTE — Progress Notes (Signed)
PERIOPERATIVE PRESCRIPTION FOR IMPLANTED CARDIAC DEVICE PROGRAMMING  Patient Information: Name:  WARDEN BUFFA  DOB:  March 27, 1981  MRN:  [id number]  Planned Procedure:  Eye surgery  Surgeon:  Dr. Aura Camps  Date of Procedure:  07/24/2023  Cautery will be used.  Position during surgery:  supine   Device Information:  Clinic EP Physician:  Lewayne Bunting, MD   Device Type:  Defibrillator Manufacturer and Phone #:  [employer]: [phone] Pacemaker Dependent?:  No. Date of Last Device Check:  07/01/2023 Normal Device Function?:  Yes.    Electrophysiologist's Recommendations:  Have magnet available. Provide continuous ECG monitoring when magnet is used or reprogramming is to be performed.  Procedure should not interfere with device function.  No device programming or magnet placement needed.  Per Device Clinic Standing Orders, Wiliam Ke, RN  7:27 AM 07/25/2023

## 2023-07-26 ENCOUNTER — Other Ambulatory Visit (HOSPITAL_COMMUNITY): Payer: Self-pay

## 2023-08-06 ENCOUNTER — Other Ambulatory Visit (HOSPITAL_COMMUNITY): Payer: Self-pay

## 2023-08-08 ENCOUNTER — Other Ambulatory Visit (HOSPITAL_COMMUNITY): Payer: Self-pay | Admitting: Cardiology

## 2023-08-09 ENCOUNTER — Other Ambulatory Visit: Payer: Self-pay

## 2023-08-09 ENCOUNTER — Other Ambulatory Visit (HOSPITAL_COMMUNITY): Payer: Self-pay

## 2023-08-09 MED ORDER — CLOPIDOGREL BISULFATE 75 MG PO TABS
75.0000 mg | ORAL_TABLET | Freq: Every day | ORAL | 0 refills | Status: DC
  Filled 2023-08-09 – 2023-10-23 (×2): qty 90, 90d supply, fill #0

## 2023-09-10 ENCOUNTER — Other Ambulatory Visit (HOSPITAL_COMMUNITY): Payer: Self-pay

## 2023-09-10 ENCOUNTER — Other Ambulatory Visit (HOSPITAL_COMMUNITY): Payer: Self-pay | Admitting: Adult Health

## 2023-09-11 ENCOUNTER — Other Ambulatory Visit (HOSPITAL_COMMUNITY): Payer: Self-pay

## 2023-09-11 MED ORDER — ROSUVASTATIN CALCIUM 40 MG PO TABS
40.0000 mg | ORAL_TABLET | Freq: Every day | ORAL | 0 refills | Status: DC
  Filled 2023-09-11 – 2023-10-23 (×2): qty 90, 90d supply, fill #0

## 2023-09-23 ENCOUNTER — Other Ambulatory Visit (HOSPITAL_COMMUNITY): Payer: Self-pay

## 2023-09-30 ENCOUNTER — Ambulatory Visit (INDEPENDENT_AMBULATORY_CARE_PROVIDER_SITE_OTHER): Payer: Medicare Other

## 2023-09-30 DIAGNOSIS — I5022 Chronic systolic (congestive) heart failure: Secondary | ICD-10-CM

## 2023-09-30 DIAGNOSIS — I255 Ischemic cardiomyopathy: Secondary | ICD-10-CM

## 2023-10-02 LAB — CUP PACEART REMOTE DEVICE CHECK
Battery Remaining Longevity: 150 mo
Battery Remaining Percentage: 100 %
Brady Statistic RV Percent Paced: 0 %
Date Time Interrogation Session: 20241217091800
HighPow Impedance: 69 Ohm
Implantable Lead Connection Status: 753985
Implantable Lead Implant Date: 20220909
Implantable Lead Location: 753860
Implantable Lead Model: 137
Implantable Lead Serial Number: 301248
Implantable Pulse Generator Implant Date: 20220909
Lead Channel Impedance Value: 684 Ohm
Lead Channel Pacing Threshold Amplitude: 0.9 V
Lead Channel Pacing Threshold Pulse Width: 0.4 ms
Lead Channel Setting Pacing Amplitude: 3.5 V
Lead Channel Setting Pacing Pulse Width: 0.4 ms
Lead Channel Setting Sensing Sensitivity: 0.6 mV
Pulse Gen Serial Number: 213174
Zone Setting Status: 755011

## 2023-10-23 ENCOUNTER — Other Ambulatory Visit (HOSPITAL_COMMUNITY): Payer: Self-pay | Admitting: Adult Health

## 2023-10-23 ENCOUNTER — Other Ambulatory Visit (HOSPITAL_COMMUNITY): Payer: Self-pay | Admitting: Cardiology

## 2023-10-23 ENCOUNTER — Other Ambulatory Visit (HOSPITAL_COMMUNITY): Payer: Self-pay

## 2023-10-23 ENCOUNTER — Other Ambulatory Visit: Payer: Self-pay

## 2023-10-23 DIAGNOSIS — E785 Hyperlipidemia, unspecified: Secondary | ICD-10-CM

## 2023-10-23 MED ORDER — DIGOXIN 125 MCG PO TABS
0.1250 mg | ORAL_TABLET | Freq: Every day | ORAL | 2 refills | Status: DC
  Filled 2023-10-23: qty 30, 30d supply, fill #0

## 2023-10-23 MED ORDER — EZETIMIBE 10 MG PO TABS
10.0000 mg | ORAL_TABLET | Freq: Every day | ORAL | 2 refills | Status: DC
  Filled 2023-10-23: qty 30, 30d supply, fill #0

## 2023-10-24 ENCOUNTER — Other Ambulatory Visit (HOSPITAL_COMMUNITY): Payer: Self-pay

## 2023-11-04 NOTE — Progress Notes (Signed)
Remote ICD transmission.   

## 2023-11-04 NOTE — Addendum Note (Signed)
Addended by: Geralyn Flash D on: 11/04/2023 10:52 AM   Modules accepted: Orders

## 2023-12-11 ENCOUNTER — Telehealth (HOSPITAL_COMMUNITY): Payer: Self-pay | Admitting: Pharmacy Technician

## 2023-12-11 ENCOUNTER — Other Ambulatory Visit: Payer: Self-pay

## 2023-12-11 ENCOUNTER — Other Ambulatory Visit (HOSPITAL_COMMUNITY): Payer: Self-pay

## 2023-12-11 ENCOUNTER — Ambulatory Visit (HOSPITAL_COMMUNITY)
Admission: RE | Admit: 2023-12-11 | Discharge: 2023-12-11 | Disposition: A | Discharge status: Home / Self Care | Payer: Medicare Other | Source: Ambulatory Visit | Attending: Cardiology | Admitting: Cardiology

## 2023-12-11 ENCOUNTER — Encounter (HOSPITAL_COMMUNITY): Payer: Self-pay | Admitting: Cardiology

## 2023-12-11 VITALS — BP 124/80 | HR 65 | Wt 249.0 lb

## 2023-12-11 DIAGNOSIS — I255 Ischemic cardiomyopathy: Secondary | ICD-10-CM | POA: Diagnosis not present

## 2023-12-11 DIAGNOSIS — I252 Old myocardial infarction: Secondary | ICD-10-CM | POA: Insufficient documentation

## 2023-12-11 DIAGNOSIS — I110 Hypertensive heart disease with heart failure: Secondary | ICD-10-CM | POA: Diagnosis not present

## 2023-12-11 DIAGNOSIS — E669 Obesity, unspecified: Secondary | ICD-10-CM | POA: Diagnosis not present

## 2023-12-11 DIAGNOSIS — Z7902 Long term (current) use of antithrombotics/antiplatelets: Secondary | ICD-10-CM | POA: Insufficient documentation

## 2023-12-11 DIAGNOSIS — Z6841 Body Mass Index (BMI) 40.0 and over, adult: Secondary | ICD-10-CM | POA: Insufficient documentation

## 2023-12-11 DIAGNOSIS — Z951 Presence of aortocoronary bypass graft: Secondary | ICD-10-CM | POA: Insufficient documentation

## 2023-12-11 DIAGNOSIS — Z87891 Personal history of nicotine dependence: Secondary | ICD-10-CM | POA: Insufficient documentation

## 2023-12-11 DIAGNOSIS — I5022 Chronic systolic (congestive) heart failure: Secondary | ICD-10-CM | POA: Insufficient documentation

## 2023-12-11 DIAGNOSIS — I4589 Other specified conduction disorders: Secondary | ICD-10-CM | POA: Diagnosis not present

## 2023-12-11 DIAGNOSIS — G4733 Obstructive sleep apnea (adult) (pediatric): Secondary | ICD-10-CM | POA: Insufficient documentation

## 2023-12-11 DIAGNOSIS — Z95810 Presence of automatic (implantable) cardiac defibrillator: Secondary | ICD-10-CM | POA: Insufficient documentation

## 2023-12-11 DIAGNOSIS — Z79899 Other long term (current) drug therapy: Secondary | ICD-10-CM | POA: Diagnosis not present

## 2023-12-11 DIAGNOSIS — I2510 Atherosclerotic heart disease of native coronary artery without angina pectoris: Secondary | ICD-10-CM | POA: Insufficient documentation

## 2023-12-11 DIAGNOSIS — E785 Hyperlipidemia, unspecified: Secondary | ICD-10-CM | POA: Insufficient documentation

## 2023-12-11 DIAGNOSIS — Z955 Presence of coronary angioplasty implant and graft: Secondary | ICD-10-CM | POA: Insufficient documentation

## 2023-12-11 LAB — BASIC METABOLIC PANEL
Anion gap: 11 (ref 5–15)
BUN: 5 mg/dL — ABNORMAL LOW (ref 6–20)
CO2: 21 mmol/L — ABNORMAL LOW (ref 22–32)
Calcium: 8.6 mg/dL — ABNORMAL LOW (ref 8.9–10.3)
Chloride: 104 mmol/L (ref 98–111)
Creatinine, Ser: 0.92 mg/dL (ref 0.61–1.24)
GFR, Estimated: >60 mL/min (ref >60)
Glucose, Bld: 95 mg/dL (ref 70–99)
Potassium: 3.4 mmol/L — ABNORMAL LOW (ref 3.5–5.1)
Sodium: 136 mmol/L (ref 135–145)

## 2023-12-11 LAB — DIGOXIN LEVEL: Digoxin Level: <0.2 ng/mL — ABNORMAL LOW (ref 0.8–2.0)

## 2023-12-11 MED ORDER — SPIRONOLACTONE 25 MG PO TABS
25.0000 mg | ORAL_TABLET | Freq: Every day | ORAL | 3 refills | Status: AC
Start: 2023-12-11 — End: ?
  Filled 2023-12-11: qty 90, 90d supply, fill #0
  Filled 2024-04-01: qty 30, 30d supply, fill #1
  Filled 2024-06-30: qty 30, 30d supply, fill #2
  Filled 2024-10-18: qty 30, 30d supply, fill #3

## 2023-12-11 MED ORDER — TORSEMIDE 20 MG PO TABS
20.0000 mg | ORAL_TABLET | ORAL | 6 refills | Status: DC | PRN
  Filled 2023-12-11: qty 30, 30d supply, fill #0

## 2023-12-11 MED ORDER — CLOPIDOGREL BISULFATE 75 MG PO TABS
75.0000 mg | ORAL_TABLET | Freq: Every day | ORAL | 3 refills | Status: AC
Start: 2023-12-11 — End: ?
  Filled 2023-12-11: qty 90, 90d supply, fill #0
  Filled 2024-04-01: qty 30, 30d supply, fill #0
  Filled 2024-06-30: qty 30, 30d supply, fill #1
  Filled 2024-10-18: qty 30, 30d supply, fill #2

## 2023-12-11 MED ORDER — CARVEDILOL 6.25 MG PO TABS
6.2500 mg | ORAL_TABLET | Freq: Two times a day (BID) | ORAL | 3 refills | Status: DC
Start: 2023-12-11 — End: 2024-05-04
  Filled 2023-12-11 (×2): qty 180, 90d supply, fill #0
  Filled 2024-04-01: qty 60, 30d supply, fill #1

## 2023-12-11 MED ORDER — EZETIMIBE 10 MG PO TABS
10.0000 mg | ORAL_TABLET | Freq: Every day | ORAL | 3 refills | Status: AC
Start: 2023-12-11 — End: ?
  Filled 2023-12-11: qty 90, 90d supply, fill #0
  Filled 2024-04-01: qty 30, 30d supply, fill #0
  Filled 2024-06-30: qty 30, 30d supply, fill #1
  Filled 2024-10-18: qty 30, 30d supply, fill #2

## 2023-12-11 MED ORDER — DAPAGLIFLOZIN PROPANEDIOL 10 MG PO TABS
10.0000 mg | ORAL_TABLET | Freq: Every day | ORAL | 3 refills | Status: AC
Start: 2023-12-11 — End: ?
  Filled 2023-12-11: qty 90, 90d supply, fill #0
  Filled 2024-04-01: qty 90, 90d supply, fill #1

## 2023-12-11 MED ORDER — DIGOXIN 125 MCG PO TABS
0.1250 mg | ORAL_TABLET | Freq: Every day | ORAL | 3 refills | Status: AC
  Filled 2023-12-11 (×2): qty 90, 90d supply, fill #0
  Filled 2024-04-01: qty 30, 30d supply, fill #1
  Filled 2024-06-30: qty 30, 30d supply, fill #2
  Filled 2024-10-18: qty 30, 30d supply, fill #3

## 2023-12-11 MED ORDER — ROSUVASTATIN CALCIUM 40 MG PO TABS
40.0000 mg | ORAL_TABLET | Freq: Every day | ORAL | 3 refills | Status: AC
Start: 2023-12-11 — End: ?
  Filled 2023-12-11: qty 90, 90d supply, fill #0
  Filled 2024-04-01: qty 30, 30d supply, fill #0
  Filled 2024-06-30: qty 30, 30d supply, fill #1
  Filled 2024-10-18: qty 30, 30d supply, fill #2

## 2023-12-11 MED ORDER — SACUBITRIL-VALSARTAN 97-103 MG PO TABS
1.0000 | ORAL_TABLET | Freq: Two times a day (BID) | ORAL | 3 refills | Status: DC
  Filled 2023-12-11: qty 180, 90d supply, fill #0

## 2023-12-11 MED ORDER — POTASSIUM CHLORIDE CRYS ER 20 MEQ PO TBCR
20.0000 meq | EXTENDED_RELEASE_TABLET | ORAL | 6 refills | Status: DC | PRN
  Filled 2023-12-11: qty 30, 30d supply, fill #0

## 2023-12-11 MED ORDER — ICOSAPENT ETHYL 1 G PO CAPS
2.0000 g | ORAL_CAPSULE | Freq: Two times a day (BID) | ORAL | 3 refills | Status: AC
Start: 2023-12-11 — End: ?
  Filled 2023-12-11: qty 120, 30d supply, fill #0
  Filled 2024-04-01: qty 120, 30d supply, fill #1

## 2023-12-11 NOTE — Telephone Encounter (Signed)
 Advanced Heart Failure Patient Advocate Encounter  The patient was approved for a [employer] grant that will help cover the cost of Vascepa. Total amount awarded, $10,000. Eligibility, 11/11/23 - 11/09/24.  ID [id number]  BIN [id number]  PCN [id number]  Group [id number]  Patient given copy while in office.  Archer Asa, CPhT

## 2023-12-11 NOTE — Patient Instructions (Signed)
 There has been no changes to your medications.  Labs done today, your results will be available in MyChart, we will contact you for abnormal readings.  Your physician has requested that you have an echocardiogram. Echocardiography is a painless test that uses sound waves to create images of your heart. It provides your doctor with information about the size and shape of your heart and how well your heart's chambers and valves are working. This procedure takes approximately one hour. There are no restrictions for this procedure. Please do NOT wear cologne, perfume, aftershave, or lotions (deodorant is allowed). Please arrive 15 minutes prior to your appointment time.  Please note: We ask at that you not bring children with you during ultrasound (echo/ vascular) testing. Due to room size and safety concerns, children are not allowed in the ultrasound rooms during exams. Our front office staff cannot provide observation of children in our lobby area while testing is being conducted. An adult accompanying a patient to their appointment will only be allowed in the ultrasound room at the discretion of the ultrasound technician under special circumstances. We apologize for any inconvenience.  Your physician recommends that you schedule a follow-up appointment in: 3 months.  If you have any questions or concerns before your next appointment please send [place] a message through [place] or call our office at [phone].    TO LEAVE A MESSAGE FOR THE NURSE SELECT OPTION 2, PLEASE LEAVE A MESSAGE INCLUDING: YOUR NAME DATE OF BIRTH CALL BACK NUMBER REASON FOR CALL**this is important as we prioritize the call backs  YOU WILL RECEIVE A CALL BACK THE SAME DAY AS LONG AS YOU CALL BEFORE 4:00 PM  At the [facility], you and your health needs are our priority. As part of our continuing mission to provide you with exceptional heart care, we have created designated Provider Care Teams. These Care  Teams include your primary Cardiologist (physician) and Advanced Practice Providers (APPs- Physician Assistants and Nurse Practitioners) who all work together to provide you with the care you need, when you need it.   You may see any of the following providers on your designated Care Team at your next follow up: Dr Arvilla Meres Dr Marca Ancona Dr. Dorthula Nettles Dr. Clearnce Hasten Amy Filbert Schilder, NP Robbie Lis, [place] [facility] [place], [place] Brynda Peon, NP [place] Lee, NP Clarisa Kindred, NP Karle Plumber, PharmD Enos Fling, PharmD   Please be sure to bring in all your medications bottles to every appointment.    Thank you for choosing [facility]

## 2023-12-11 NOTE — Telephone Encounter (Signed)
 Advanced Heart Failure Patient Advocate Encounter  The patient was approved for a [employer] grant that will help cover the cost of Entresto, Farxiga, Coreg and Spironolactone. Total amount awarded, $10,000. Eligibility, 11/11/23 - 11/09/24.  ID [id number]  BIN [id number]  PCN [id number]  Group [id number]  Patient was given a copy while in clinic. [employer] wanted proof of medicare status. This was uploaded to the patients portal.  Archer Asa, CPhT
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
## 2023-12-13 ENCOUNTER — Other Ambulatory Visit (HOSPITAL_COMMUNITY): Payer: Self-pay

## 2023-12-13 ENCOUNTER — Encounter (HOSPITAL_COMMUNITY): Payer: Self-pay

## 2023-12-13 NOTE — Telephone Encounter (Signed)
 Left voicemail for patient

## 2023-12-13 NOTE — Telephone Encounter (Signed)
 Spoke with patient, discussed grants and medication list covered by grants. We also discussed the other medications that are currently filled with copays. I have informed the patient that I will discuss any further possible options with social worker on Monday to see if we may be able to help until his coverage is reinstated.

## 2023-12-16 ENCOUNTER — Telehealth (HOSPITAL_COMMUNITY): Payer: Self-pay | Admitting: Licensed Clinical Social Worker

## 2023-12-16 ENCOUNTER — Other Ambulatory Visit (HOSPITAL_COMMUNITY): Payer: Self-pay

## 2023-12-16 NOTE — Telephone Encounter (Signed)
 H&V Care Navigation CSW Progress Note  Clinical Social Worker consulted by patient advocate to discuss continued concerns with affording medications despite being set up with grants for large portion of his medications.  Patient had Medicaid but lost it when he got disability (almost a year ago).  Got back pay and monthly rate pre deductions is about $2,100 which is over medicaid limit.  Patient unsure how much his copays will be moving forward but states it is hard for him to afford very small charges as all his income goes towards housing.  Gets his meds at [facility] pharmacy.  CSW informed pt of ability to set up billing account so he doesn't have to pay up front if he can't afford and can just make small payments as able.  Also informed I could assist sometimes with payments if need be so that he could continue to get his medications.  Pt expressed understanding.  Only out of two medications right now- confirmed with patient advocate cost of this and will be about $5 for 90 day supply- informed pt who expressed no concerns with being able to get.  Encouraged him to reach out to me if he has issues obtaining medications in the future.   SDOH Screenings   Food Insecurity: No Food Insecurity (11/22/2023)   Received from [facility]  Housing: Low Risk  (11/22/2023)   Received from [facility]  Transportation Needs: No Transportation Needs (11/22/2023)   Received from [facility]  Utilities: Not At Risk (11/22/2023)   Received from [facility]  Financial Resource Strain: Low Risk  (11/22/2023)   Received from [facility]  Physical Activity: Unknown (12/31/2022)   Received from [facility]  Social Connections: Moderately Integrated (12/31/2022)   Received from [facility]  Recent Concern: Social Connections - Somewhat Isolated (10/02/2022)   Received from [facility]  Stress: Stress Concern Present (12/31/2022)   Received from [facility]  Tobacco Use: Medium Risk (12/11/2023)   Burna Sis, LCSW Clinical Social Worker [facility] Desk#: [phone] Cell#: [phone]

## 2023-12-19 ENCOUNTER — Other Ambulatory Visit (HOSPITAL_COMMUNITY): Payer: Self-pay

## 2023-12-20 ENCOUNTER — Ambulatory Visit (HOSPITAL_COMMUNITY)
Admission: RE | Admit: 2023-12-20 | Discharge: 2023-12-20 | Disposition: A | Discharge status: Home / Self Care | Payer: Medicare Other | Source: Ambulatory Visit | Attending: Cardiology | Admitting: Cardiology

## 2023-12-20 DIAGNOSIS — Z006 Encounter for examination for normal comparison and control in clinical research program: Secondary | ICD-10-CM

## 2023-12-20 DIAGNOSIS — I5022 Chronic systolic (congestive) heart failure: Secondary | ICD-10-CM | POA: Diagnosis present

## 2023-12-20 DIAGNOSIS — I082 Rheumatic disorders of both aortic and tricuspid valves: Secondary | ICD-10-CM | POA: Insufficient documentation

## 2023-12-20 LAB — ECHOCARDIOGRAM COMPLETE
AR max vel: 2.89 cm2
AV Area VTI: 3.03 cm2
AV Area mean vel: 3.2 cm2
AV Mean grad: 4 mmHg
AV Peak grad: 8.5 mmHg
Ao pk vel: 1.46 m/s
Area-P 1/2: 4.86 cm2
Calc EF: 33.3 %
MV VTI: 3.72 cm2
S' Lateral: 4.7 cm
Single Plane A2C EF: 32.6 %
Single Plane A4C EF: 32.3 %

## 2023-12-20 NOTE — Progress Notes (Signed)
*  PRELIMINARY RESULTS* Echocardiogram 2D Echocardiogram has been performed.  [name] 12/20/2023, 3:12 PM

## 2023-12-20 NOTE — Research (Signed)
 SITE: [id number]     Subject # [id number]    Subprotocol: A  Inclusion Criteria  Patients who meet all of the following criteria are eligible for enrollment as study participants:  Yes No  Age > 44 years old X   Eligible to wear Holter Study X    Exclusion Criteria  Patients who meet any of these criteria are not eligible for enrollment as study participants: Yes No  1. Receiving any mechanical (respiratory or circulatory) or renal support therapy at Screening or during Visit #1.  X  2.  Any other conditions that in the opinion of the investigators are likely to prevent compliance with the study protocol or pose a safety concern if the subject participates in the study.  X  3. Poor tolerance, namely susceptible to severe skin allergies from ECG adhesive patch application.  X   Protocol: REV H                                     Residential Zip code [address] (First 3 digits ONLY)                                             Peer[facility] Informed Consent   Subject Name: [name]  Subject met inclusion and exclusion criteria.  The informed consent form, study requirements and expectations were reviewed with the subject. Subject had opportunity to read consent and questions and concerns were addressed prior to the signing of the consent form.  The subject verbalized understanding of the trial requirements.  The subject agreed to participate in the Peer[facility] EF ACT trial and signed the informed consent at 15:13 on 20-Dec-2023.  The informed consent was obtained prior to performance of any protocol-specific procedures for the subject.  A copy of the signed informed consent was given to the subject and a copy was placed in the subject's medical record.   Dyanne Iha          Current Outpatient Medications:    aspirin-acetaminophen-caffeine (EXCEDRIN MIGRAINE) 250-250-65 MG tablet, Take 2 tablets by mouth every 6 (six) hours as needed for headache or migraine., Disp: , Rfl:    carvedilol (COREG) 6.25  MG tablet, Take 1 tablet (6.25 mg total) by mouth 2 (two) times daily., Disp: 180 tablet, Rfl: 3   clopidogrel (PLAVIX) 75 MG tablet, Take 1 tablet (75 mg total) by mouth daily., Disp: 90 tablet, Rfl: 3   dapagliflozin propanediol (FARXIGA) 10 MG TABS tablet, Take 1 tablet (10 mg total) by mouth daily before breakfast., Disp: 90 tablet, Rfl: 3   digoxin (LANOXIN) 0.125 MG tablet, Take 1 tablet (0.125 mg total) by mouth daily., Disp: 90 tablet, Rfl: 3   ezetimibe (ZETIA) 10 MG tablet, Take 1 tablet (10 mg total) by mouth daily., Disp: 90 tablet, Rfl: 3   gabapentin (NEURONTIN) 300 MG capsule, Take 1 capsule (300 mg total) by mouth 3 (three) times daily., Disp: 90 capsule, Rfl: 0   icosapent Ethyl (VASCEPA) 1 g capsule, Take 2 capsules (2 g total) by mouth 2 (two) times daily., Disp: 180 capsule, Rfl: 3   LORazepam (ATIVAN) 0.5 MG tablet, Take 0.5 mg by mouth 2 (two) times daily as needed for anxiety., Disp: , Rfl:    potassium chloride SA (KLOR-CON M) 20 MEQ  tablet, Take 1 tablet (20 mEq total) by mouth as needed. Take when taking Torsemide as needed for weight gain of 3 lbs in a day or 5 lbs in a week, Disp: 30 tablet, Rfl: 6   rosuvastatin (CRESTOR) 40 MG tablet, Take 1 tablet (40 mg total) by mouth daily., Disp: 90 tablet, Rfl: 3   sacubitril-valsartan (ENTRESTO) 97-103 MG, Take 1 tablet by mouth 2 (two) times daily., Disp: 180 tablet, Rfl: 3   spironolactone (ALDACTONE) 25 MG tablet, Take 1 tablet (25 mg total) by mouth at bedtime., Disp: 90 tablet, Rfl: 3   torsemide (DEMADEX) 20 MG tablet, Take 1 tablet (20 mg total) by mouth as needed for weight gain 3 lbs in a day or 5 lbs in a week, Disp: 30 tablet, Rfl: 6   traZODone (DESYREL) 50 MG tablet, Take 1/2-1 tablet (25-50 mg total) by mouth at bedtime as needed for sleep, Disp: 60 tablet, Rfl: 0 No current facility-administered medications for this visit.  Facility-Administered Medications Ordered in Other Visits:    sodium chloride flush (NS) 0.9  % injection 3 mL, 3 mL, Intravenous, Q12H, Laurey Morale, MD

## 2023-12-23 ENCOUNTER — Telehealth (HOSPITAL_COMMUNITY): Payer: Self-pay | Admitting: *Deleted

## 2023-12-23 NOTE — Telephone Encounter (Signed)
 Called patient per Dr. Shirlee Latch with following echo results:  "EF 30-35%, similar to prior."  Pt verbalized understanding of same.

## 2023-12-24 ENCOUNTER — Other Ambulatory Visit (HOSPITAL_COMMUNITY): Payer: Self-pay

## 2023-12-30 ENCOUNTER — Other Ambulatory Visit: Payer: Self-pay

## 2023-12-30 ENCOUNTER — Ambulatory Visit (INDEPENDENT_AMBULATORY_CARE_PROVIDER_SITE_OTHER): Payer: Medicaid Other

## 2023-12-30 DIAGNOSIS — I255 Ischemic cardiomyopathy: Secondary | ICD-10-CM

## 2023-12-30 DIAGNOSIS — I5022 Chronic systolic (congestive) heart failure: Secondary | ICD-10-CM

## 2024-01-01 ENCOUNTER — Encounter: Payer: Self-pay | Admitting: Internal Medicine

## 2024-01-01 LAB — CUP PACEART REMOTE DEVICE CHECK
Battery Remaining Longevity: 156 mo
Battery Remaining Percentage: 100 %
Brady Statistic RV Percent Paced: 0 %
Date Time Interrogation Session: 20250317110500
HighPow Impedance: 82 Ohm
Implantable Lead Connection Status: 753985
Implantable Lead Implant Date: 20220909
Implantable Lead Location: 753860
Implantable Lead Model: 137
Implantable Lead Serial Number: 301248
Implantable Pulse Generator Implant Date: 20220909
Lead Channel Impedance Value: 725 Ohm
Lead Channel Pacing Threshold Amplitude: 0.8 V
Lead Channel Pacing Threshold Pulse Width: 0.4 ms
Lead Channel Setting Pacing Amplitude: 3.5 V
Lead Channel Setting Pacing Pulse Width: 0.4 ms
Lead Channel Setting Sensing Sensitivity: 0.6 mV
Pulse Gen Serial Number: 213174
Zone Setting Status: 755011

## 2024-02-12 NOTE — Addendum Note (Signed)
 Addended by: Edra Govern D on: 02/12/2024 10:56 AM   Modules accepted: Orders

## 2024-02-12 NOTE — Progress Notes (Signed)
 Remote ICD transmission.

## 2024-02-13 NOTE — Congregational Nurse Program (Signed)
 Completed health wellness assesment/interview with pt upon him completing his enrollment  with the [employer] Program.  Assisted pt with getting connecting with a PCP- [facility]

## 2024-02-22 LAB — LAB REPORT - SCANNED: EGFR: 109
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
## 2024-03-06 ENCOUNTER — Telehealth (HOSPITAL_COMMUNITY): Payer: Self-pay

## 2024-03-06 NOTE — Telephone Encounter (Signed)
 Called to confirm/remind patient of their appointment at the [facility] Clinic on 03/10/24.   Appointment:   [x] Confirmed  [] Left mess   [] No answer/No voice mail  [] VM Full/unable to leave message  [] Phone not in service  Patient reminded to bring all medications and/or complete list.  Confirmed patient has transportation. Gave directions, instructed to utilize valet parking.

## 2024-03-10 ENCOUNTER — Ambulatory Visit (HOSPITAL_COMMUNITY)
Admission: RE | Admit: 2024-03-10 | Discharge: 2024-03-10 | Disposition: A | Discharge status: Home / Self Care | Payer: Medicare Other | Source: Ambulatory Visit | Attending: Cardiology | Admitting: Cardiology

## 2024-03-10 ENCOUNTER — Encounter (HOSPITAL_COMMUNITY): Payer: Self-pay

## 2024-03-10 ENCOUNTER — Other Ambulatory Visit (HOSPITAL_COMMUNITY): Payer: Self-pay

## 2024-03-10 VITALS — BP 154/74 | HR 73 | Wt 255.8 lb

## 2024-03-10 DIAGNOSIS — Z955 Presence of coronary angioplasty implant and graft: Secondary | ICD-10-CM | POA: Insufficient documentation

## 2024-03-10 DIAGNOSIS — I2510 Atherosclerotic heart disease of native coronary artery without angina pectoris: Secondary | ICD-10-CM | POA: Diagnosis not present

## 2024-03-10 DIAGNOSIS — I252 Old myocardial infarction: Secondary | ICD-10-CM | POA: Diagnosis not present

## 2024-03-10 DIAGNOSIS — Z951 Presence of aortocoronary bypass graft: Secondary | ICD-10-CM | POA: Diagnosis not present

## 2024-03-10 DIAGNOSIS — E669 Obesity, unspecified: Secondary | ICD-10-CM | POA: Diagnosis not present

## 2024-03-10 DIAGNOSIS — Z7902 Long term (current) use of antithrombotics/antiplatelets: Secondary | ICD-10-CM | POA: Insufficient documentation

## 2024-03-10 DIAGNOSIS — Z6841 Body Mass Index (BMI) 40.0 and over, adult: Secondary | ICD-10-CM | POA: Insufficient documentation

## 2024-03-10 DIAGNOSIS — G4733 Obstructive sleep apnea (adult) (pediatric): Secondary | ICD-10-CM | POA: Diagnosis not present

## 2024-03-10 DIAGNOSIS — I10 Essential (primary) hypertension: Secondary | ICD-10-CM | POA: Diagnosis not present

## 2024-03-10 DIAGNOSIS — Z95810 Presence of automatic (implantable) cardiac defibrillator: Secondary | ICD-10-CM | POA: Insufficient documentation

## 2024-03-10 DIAGNOSIS — I5022 Chronic systolic (congestive) heart failure: Secondary | ICD-10-CM | POA: Insufficient documentation

## 2024-03-10 DIAGNOSIS — Z79899 Other long term (current) drug therapy: Secondary | ICD-10-CM | POA: Diagnosis not present

## 2024-03-10 DIAGNOSIS — I255 Ischemic cardiomyopathy: Secondary | ICD-10-CM | POA: Insufficient documentation

## 2024-03-10 DIAGNOSIS — Z91148 Patient's other noncompliance with medication regimen for other reason: Secondary | ICD-10-CM | POA: Insufficient documentation

## 2024-03-10 DIAGNOSIS — I110 Hypertensive heart disease with heart failure: Secondary | ICD-10-CM | POA: Diagnosis not present

## 2024-03-10 MED ORDER — POTASSIUM CHLORIDE CRYS ER 20 MEQ PO TBCR
20.0000 meq | EXTENDED_RELEASE_TABLET | ORAL | 8 refills | Status: DC
Start: 2024-03-11 — End: 2024-08-04
  Filled 2024-03-10: qty 15, 35d supply, fill #0
  Filled 2024-04-01: qty 13, 30d supply, fill #0

## 2024-03-10 MED ORDER — TORSEMIDE 20 MG PO TABS
20.0000 mg | ORAL_TABLET | ORAL | 8 refills | Status: DC
  Filled 2024-03-10: qty 15, 35d supply, fill #0
  Filled 2024-04-01: qty 13, 30d supply, fill #0

## 2024-03-10 NOTE — Patient Instructions (Addendum)
 Thank you for coming in today  If you had labs drawn today, any labs that are abnormal the clinic will call you No news is good news  Medications: Start Torsemide  20 mg with Potassium 20 meq every Monday Wednesday Friday Start Crestor  40 mg daily  Follow up appointments:  Your physician recommends that you schedule a follow-up appointment in:  1 month with Pharmacy with BMET  3 months With Dr. Mitzie Anda Please call our office to schedule the follow-up appointment in June for August 2025.   Do the following things EVERYDAY: Weigh yourself in the morning before breakfast. Write it down and keep it in a log. Take your medicines as prescribed Eat low salt foods--Limit salt (sodium) to 2000 mg per day.  Stay as active as you can everyday Limit all fluids for the day to less than 2 liters   At the [facility], you and your health needs are our priority. As part of our continuing mission to provide you with exceptional heart care, we have created designated Provider Care Teams. These Care Teams include your primary Cardiologist (physician) and Advanced Practice Providers (APPs- Physician Assistants and Nurse Practitioners) who all work together to provide you with the care you need, when you need it.   You may see any of the following providers on your designated Care Team at your next follow up: Dr Jules Oar Dr Peder Bourdon Dr. Mimi Alt, NP Ruddy Corral, [place] [facility] [place], [place] Dennise Fitz, NP Luster Salters, PharmD   Please be sure to bring in all your medications bottles to every appointment.    Thank you for choosing [facility]  If you have any questions or concerns before your next appointment please send us  a message through [place] or call our office at [phone].    TO LEAVE A MESSAGE FOR THE NURSE SELECT OPTION 2, PLEASE LEAVE A MESSAGE INCLUDING: YOUR NAME DATE OF  BIRTH CALL BACK NUMBER REASON FOR CALL**this is important as we prioritize the call backs  YOU WILL RECEIVE A CALL BACK THE SAME DAY AS LONG AS YOU CALL BEFORE 4:00 PM

## 2024-03-20 ENCOUNTER — Other Ambulatory Visit (HOSPITAL_COMMUNITY): Payer: Self-pay

## 2024-03-25 ENCOUNTER — Other Ambulatory Visit (HOSPITAL_COMMUNITY): Payer: Self-pay

## 2024-03-30 ENCOUNTER — Ambulatory Visit (INDEPENDENT_AMBULATORY_CARE_PROVIDER_SITE_OTHER): Payer: Medicaid Other

## 2024-03-30 DIAGNOSIS — I255 Ischemic cardiomyopathy: Secondary | ICD-10-CM

## 2024-03-30 DIAGNOSIS — I5022 Chronic systolic (congestive) heart failure: Secondary | ICD-10-CM

## 2024-04-01 ENCOUNTER — Ambulatory Visit (HOSPITAL_COMMUNITY)
Admission: RE | Admit: 2024-04-01 | Discharge: 2024-04-01 | Disposition: A | Discharge status: Home / Self Care | Source: Ambulatory Visit | Attending: Cardiology | Admitting: Cardiology

## 2024-04-01 ENCOUNTER — Other Ambulatory Visit (HOSPITAL_COMMUNITY): Payer: Self-pay

## 2024-04-01 VITALS — BP 118/78 | HR 68 | Wt 250.0 lb

## 2024-04-01 DIAGNOSIS — Z7984 Long term (current) use of oral hypoglycemic drugs: Secondary | ICD-10-CM | POA: Diagnosis not present

## 2024-04-01 DIAGNOSIS — I2510 Atherosclerotic heart disease of native coronary artery without angina pectoris: Secondary | ICD-10-CM | POA: Diagnosis present

## 2024-04-01 DIAGNOSIS — G4733 Obstructive sleep apnea (adult) (pediatric): Secondary | ICD-10-CM | POA: Diagnosis not present

## 2024-04-01 DIAGNOSIS — I5022 Chronic systolic (congestive) heart failure: Secondary | ICD-10-CM | POA: Diagnosis not present

## 2024-04-01 DIAGNOSIS — Z91148 Patient's other noncompliance with medication regimen for other reason: Secondary | ICD-10-CM | POA: Diagnosis not present

## 2024-04-01 DIAGNOSIS — I252 Old myocardial infarction: Secondary | ICD-10-CM | POA: Diagnosis not present

## 2024-04-01 DIAGNOSIS — I255 Ischemic cardiomyopathy: Secondary | ICD-10-CM | POA: Insufficient documentation

## 2024-04-01 DIAGNOSIS — Z951 Presence of aortocoronary bypass graft: Secondary | ICD-10-CM | POA: Insufficient documentation

## 2024-04-01 DIAGNOSIS — E669 Obesity, unspecified: Secondary | ICD-10-CM | POA: Diagnosis not present

## 2024-04-01 DIAGNOSIS — Z79899 Other long term (current) drug therapy: Secondary | ICD-10-CM | POA: Insufficient documentation

## 2024-04-01 DIAGNOSIS — I110 Hypertensive heart disease with heart failure: Secondary | ICD-10-CM | POA: Diagnosis not present

## 2024-04-01 LAB — CUP PACEART REMOTE DEVICE CHECK
Battery Remaining Longevity: 150 mo
Battery Remaining Percentage: 100 %
Brady Statistic RV Percent Paced: 0 %
Date Time Interrogation Session: 20250616000000
HighPow Impedance: 93 Ohm
Implantable Lead Connection Status: 753985
Implantable Lead Implant Date: 20220909
Implantable Lead Location: 753860
Implantable Lead Model: 137
Implantable Lead Serial Number: 301248
Implantable Pulse Generator Implant Date: 20220909
Lead Channel Impedance Value: 700 Ohm
Lead Channel Pacing Threshold Amplitude: 0.9 V
Lead Channel Pacing Threshold Pulse Width: 0.4 ms
Lead Channel Setting Pacing Amplitude: 3.5 V
Lead Channel Setting Pacing Pulse Width: 0.4 ms
Lead Channel Setting Sensing Sensitivity: 0.6 mV
Pulse Gen Serial Number: 213174
Zone Setting Status: 755011

## 2024-04-01 MED ORDER — ENTRESTO 49-51 MG PO TABS
1.0000 | ORAL_TABLET | Freq: Two times a day (BID) | ORAL | 3 refills | Status: DC
  Filled 2024-04-01: qty 180, 90d supply, fill #0
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
## 2024-04-01 NOTE — Patient Instructions (Signed)
 It was a pleasure seeing you today!  MEDICATIONS: -Take torsemide  20 mg daily today, tomorrow, and Friday, then resume torsemide  Monday, Wednesday, and Fridays -Restart Plavix  (clopidogrel ) 75 mg daily -Restart Crestor  (rosuvastatin ) 40 mg daily -Restart Zetia  (ezetimibe ) 10 mg daily -Continue taking all other heart failure medications as prescribed -Call if you have questions about your medications.  LABS: -We will call you if your labs need attention.  NEXT APPOINTMENT: Return to clinic for follow-up with Dr. Mitzie Anda.  In general, to take care of your heart failure: -Limit your fluid intake to 2 Liters (half-gallon) per day.   -Limit your salt intake to ideally 2-3 grams (2000-3000 mg) per day. -Weigh yourself daily and record, and bring that weight diary to your next appointment.  (Weight gain of 2-3 pounds in 1 day typically means fluid weight.) -The medications for your heart are to help your heart and help you live longer.   -Please contact us  before stopping any of your heart medications.  Call the clinic at [phone] with questions or to reschedule future appointments.

## 2024-04-02 ENCOUNTER — Ambulatory Visit: Payer: Self-pay | Admitting: Internal Medicine

## 2024-04-13 ENCOUNTER — Telehealth (HOSPITAL_COMMUNITY): Payer: Self-pay

## 2024-04-13 NOTE — Telephone Encounter (Signed)
 Application for Entresto  faxed to [employer] on 04/13/2024. Application form attached to patient chart.

## 2024-04-13 NOTE — Telephone Encounter (Signed)
 Advanced Heart Failure Patient Advocate Encounter   Patient has lost both Medicaid and Medicare coverage since start of the year. [employer] grant not able to be applied unless / until Medicare is reinstated.   Confirmed pt currently has no insurance coverage, obtained consent for assistance applications, and verified income.

## 2024-04-13 NOTE — Telephone Encounter (Signed)
 Application for Farxiga  faxed to [employer] on 04/13/2024. Application form attached to patient chart.

## 2024-04-13 NOTE — Telephone Encounter (Signed)
 Advanced Heart Failure Patient Advocate Encounter  Patient has lost both Medicaid and Medicare coverage since start of the year. [employer] grant not able to be applied unless / until Medicare is reinstated.  Confirmed pt currently has no insurance coverage, obtained consent for assistance applications, and verified income.

## 2024-04-16 ENCOUNTER — Other Ambulatory Visit (HOSPITAL_COMMUNITY): Payer: Self-pay

## 2024-04-16 NOTE — Telephone Encounter (Signed)
 [employer] is requesting proof of income before making a determination for this patient. Spoke to [name] by phone, he will work to obtain a copy of his disability letter to submit to [employer]. This [name] does not receive stubs or logs for the payments, and does not make enough to qualify to file 1040. If he is unable to obtain a copy of the disability letter we may need to reach out to [employer] for an attestation letter or see about other options to verify income.

## 2024-04-16 NOTE — Telephone Encounter (Signed)
 Patient was approved to receive Farxiga  from [place] Effective 04/13/2024 to 10/14/2024  Informed pt by phone, he will expect the first delivery within the next 7-10 days.

## 2024-04-30 NOTE — Telephone Encounter (Signed)
 Spoke with patient by phone, he has not been able to obtain records of income that would be accepted by [employer]. I have provided the number for [employer] and asked the patient to request an income attestation letter. Will continue to follow up.

## 2024-05-04 ENCOUNTER — Ambulatory Visit (HOSPITAL_COMMUNITY)
Admission: RE | Admit: 2024-05-04 | Discharge: 2024-05-04 | Disposition: A | Discharge status: Home / Self Care | Source: Ambulatory Visit | Attending: Cardiology | Admitting: Cardiology

## 2024-05-04 ENCOUNTER — Ambulatory Visit (HOSPITAL_COMMUNITY): Payer: Self-pay | Admitting: Cardiology

## 2024-05-04 ENCOUNTER — Encounter (HOSPITAL_COMMUNITY): Payer: Self-pay | Admitting: Cardiology

## 2024-05-04 ENCOUNTER — Other Ambulatory Visit (HOSPITAL_COMMUNITY): Payer: Self-pay

## 2024-05-04 VITALS — BP 134/98 | HR 77 | Ht 66.0 in | Wt 257.4 lb

## 2024-05-04 DIAGNOSIS — Z95810 Presence of automatic (implantable) cardiac defibrillator: Secondary | ICD-10-CM | POA: Insufficient documentation

## 2024-05-04 DIAGNOSIS — I252 Old myocardial infarction: Secondary | ICD-10-CM | POA: Insufficient documentation

## 2024-05-04 DIAGNOSIS — I255 Ischemic cardiomyopathy: Secondary | ICD-10-CM

## 2024-05-04 DIAGNOSIS — G4733 Obstructive sleep apnea (adult) (pediatric): Secondary | ICD-10-CM

## 2024-05-04 DIAGNOSIS — Z7984 Long term (current) use of oral hypoglycemic drugs: Secondary | ICD-10-CM | POA: Diagnosis not present

## 2024-05-04 DIAGNOSIS — Z955 Presence of coronary angioplasty implant and graft: Secondary | ICD-10-CM | POA: Insufficient documentation

## 2024-05-04 DIAGNOSIS — Z79899 Other long term (current) drug therapy: Secondary | ICD-10-CM | POA: Diagnosis not present

## 2024-05-04 DIAGNOSIS — E669 Obesity, unspecified: Secondary | ICD-10-CM | POA: Insufficient documentation

## 2024-05-04 DIAGNOSIS — I2510 Atherosclerotic heart disease of native coronary artery without angina pectoris: Secondary | ICD-10-CM | POA: Insufficient documentation

## 2024-05-04 DIAGNOSIS — Z87891 Personal history of nicotine dependence: Secondary | ICD-10-CM | POA: Diagnosis not present

## 2024-05-04 DIAGNOSIS — Z951 Presence of aortocoronary bypass graft: Secondary | ICD-10-CM | POA: Insufficient documentation

## 2024-05-04 DIAGNOSIS — Z7902 Long term (current) use of antithrombotics/antiplatelets: Secondary | ICD-10-CM | POA: Insufficient documentation

## 2024-05-04 DIAGNOSIS — I110 Hypertensive heart disease with heart failure: Secondary | ICD-10-CM | POA: Insufficient documentation

## 2024-05-04 DIAGNOSIS — I5022 Chronic systolic (congestive) heart failure: Secondary | ICD-10-CM | POA: Diagnosis present

## 2024-05-04 LAB — BASIC METABOLIC PANEL WITH GFR
Anion gap: 6 (ref 5–15)
BUN: 11 mg/dL (ref 6–20)
CO2: 22 mmol/L (ref 22–32)
Calcium: 9.1 mg/dL (ref 8.9–10.3)
Chloride: 109 mmol/L (ref 98–111)
Creatinine, Ser: 0.98 mg/dL (ref 0.61–1.24)
GFR, Estimated: >60 mL/min (ref >60)
Glucose, Bld: 98 mg/dL (ref 70–99)
Potassium: 4.3 mmol/L (ref 3.5–5.1)
Sodium: 137 mmol/L (ref 135–145)

## 2024-05-04 LAB — LIPID PANEL
Cholesterol: 135 mg/dL (ref 0–200)
HDL: 54 mg/dL (ref >40)
LDL Cholesterol: 50 mg/dL (ref 0–99)
Total CHOL/HDL Ratio: 2.5 ratio
Triglycerides: 156 mg/dL — ABNORMAL HIGH (ref <150)
VLDL: 31 mg/dL (ref 0–40)

## 2024-05-04 LAB — BRAIN NATRIURETIC PEPTIDE: B Natriuretic Peptide: 19.9 pg/mL (ref 0.0–100.0)

## 2024-05-04 LAB — DIGOXIN LEVEL: Digoxin Level: <0.4 ng/mL — ABNORMAL LOW (ref 0.8–2.0)

## 2024-05-04 MED ORDER — CARVEDILOL 12.5 MG PO TABS
12.5000 mg | ORAL_TABLET | Freq: Two times a day (BID) | ORAL | 1 refills | Status: AC
Start: 2024-05-04 — End: ?
  Filled 2024-05-04: qty 180, 90d supply, fill #0
  Filled 2024-06-30: qty 60, 30d supply, fill #0
  Filled 2024-10-18: qty 60, 30d supply, fill #1

## 2024-05-04 NOTE — Patient Instructions (Signed)
 INCREASE 12.5 MG Twice daily  Labs done today, your results will be available in MyChart, we will contact you for abnormal readings.  YOU HAVE BEEN REFERRED TO THE HEART CARE PHARMACY TEAM. THEY WILL CALL YOU TO ARRANGE YOUR APPOINTMENT.  Your physician recommends that you schedule a follow-up appointment in: 3 months.  If you have any questions or concerns before your next appointment please send us  a message through [place] or call our office at [phone].    TO LEAVE A MESSAGE FOR THE NURSE SELECT OPTION 2, PLEASE LEAVE A MESSAGE INCLUDING: YOUR NAME DATE OF BIRTH CALL BACK NUMBER REASON FOR CALL**this is important as we prioritize the call backs  YOU WILL RECEIVE A CALL BACK THE SAME DAY AS LONG AS YOU CALL BEFORE 4:00 PM  At the [facility], you and your health needs are our priority. As part of our continuing mission to provide you with exceptional heart care, we have created designated Provider Care Teams. These Care Teams include your primary Cardiologist (physician) and Advanced Practice Providers (APPs- Physician Assistants and Nurse Practitioners) who all work together to provide you with the care you need, when you need it.   You may see any of the following providers on your designated Care Team at your next follow up: Dr Toribio Fuel Dr Ezra Shuck Dr. Ria Commander Dr. Morene Brownie Amy Lenetta, NP Caffie Shed, [place] [facility], [place] Beckey Coe, NP [place] Lee, NP Ellouise Class, NP Tinnie Redman, PharmD Jaun Bash, PharmD   Please be sure to bring in all your medications bottles to every appointment.    Thank you for choosing [facility]
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
## 2024-05-07 NOTE — Progress Notes (Signed)
 Remote ICD transmission.

## 2024-05-07 NOTE — Addendum Note (Signed)
 Addended by: TAWNI DRILLING D on: 05/07/2024 11:42 AM   Modules accepted: Orders

## 2024-05-13 ENCOUNTER — Other Ambulatory Visit (HOSPITAL_COMMUNITY): Payer: Self-pay

## 2024-05-18 NOTE — Telephone Encounter (Signed)
 Spoke to patient by phone, he has not been able to request income attestation letter from [employer] as of yet. Discussed the phone menu for [employer] with the patient and went over what he will need to do for the income letter before a determination can be made. Will continue to follow up

## 2024-06-02 NOTE — Telephone Encounter (Signed)
 Spoke to [employer] for status update. Patient still has not requested attestation letter for proof of income. I have left a voicemail for the patient including the phone number for [employer] and reminding the patient that once the attestation letter is returned they will be able to review his application for free medication. I have also left my direct number for the patient to reach out if he wishes to continue to pursue medication assistance going forward.

## 2024-06-05 ENCOUNTER — Encounter: Payer: Self-pay | Admitting: Radiology

## 2024-06-29 ENCOUNTER — Encounter

## 2024-07-01 ENCOUNTER — Other Ambulatory Visit (HOSPITAL_COMMUNITY): Payer: Self-pay

## 2024-07-06 ENCOUNTER — Ambulatory Visit (INDEPENDENT_AMBULATORY_CARE_PROVIDER_SITE_OTHER)

## 2024-07-06 DIAGNOSIS — I5022 Chronic systolic (congestive) heart failure: Secondary | ICD-10-CM

## 2024-07-08 LAB — CUP PACEART REMOTE DEVICE CHECK
Battery Remaining Longevity: 150 mo
Battery Remaining Percentage: 100 %
Brady Statistic RV Percent Paced: 0 %
Date Time Interrogation Session: 20250922001500
HighPow Impedance: 73 Ohm
Implantable Lead Connection Status: 753985
Implantable Lead Implant Date: 20220909
Implantable Lead Location: 753860
Implantable Lead Model: 137
Implantable Lead Serial Number: 301248
Implantable Pulse Generator Implant Date: 20220909
Lead Channel Impedance Value: 659 Ohm
Lead Channel Pacing Threshold Amplitude: 0.9 V
Lead Channel Pacing Threshold Pulse Width: 0.4 ms
Lead Channel Setting Pacing Amplitude: 3.5 V
Lead Channel Setting Pacing Pulse Width: 0.4 ms
Lead Channel Setting Sensing Sensitivity: 0.6 mV
Pulse Gen Serial Number: 213174
Zone Setting Status: 755011

## 2024-07-08 NOTE — Progress Notes (Signed)
Remote ICD Transmission.

## 2024-07-12 ENCOUNTER — Ambulatory Visit: Payer: Self-pay | Admitting: Internal Medicine

## 2024-08-03 ENCOUNTER — Telehealth (HOSPITAL_COMMUNITY): Payer: Self-pay

## 2024-08-03 NOTE — Telephone Encounter (Signed)
 Called to confirm/remind patient of their appointment at the [facility] on 08/04/24.   Appointment:   [x] Confirmed  [] Left mess   [] No answer/No voice mail  [] VM Full/unable to leave message  [] Phone not in service  Patient reminded to bring all medications and/or complete list.  Confirmed patient has transportation. Gave directions, instructed to utilize valet parking.
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
## 2024-08-04 ENCOUNTER — Ambulatory Visit (HOSPITAL_COMMUNITY): Payer: Self-pay | Admitting: Family Medicine

## 2024-08-04 ENCOUNTER — Ambulatory Visit (HOSPITAL_COMMUNITY)
Admission: RE | Admit: 2024-08-04 | Discharge: 2024-08-04 | Disposition: A | Discharge status: Home / Self Care | Source: Ambulatory Visit | Attending: Family Medicine | Admitting: Family Medicine

## 2024-08-04 ENCOUNTER — Other Ambulatory Visit (HOSPITAL_COMMUNITY): Payer: Self-pay

## 2024-08-04 ENCOUNTER — Encounter (HOSPITAL_COMMUNITY): Payer: Self-pay

## 2024-08-04 VITALS — BP 140/84 | HR 63 | Wt 258.4 lb

## 2024-08-04 DIAGNOSIS — I255 Ischemic cardiomyopathy: Secondary | ICD-10-CM | POA: Diagnosis present

## 2024-08-04 DIAGNOSIS — Z951 Presence of aortocoronary bypass graft: Secondary | ICD-10-CM | POA: Insufficient documentation

## 2024-08-04 DIAGNOSIS — I110 Hypertensive heart disease with heart failure: Secondary | ICD-10-CM | POA: Insufficient documentation

## 2024-08-04 DIAGNOSIS — I5022 Chronic systolic (congestive) heart failure: Secondary | ICD-10-CM | POA: Diagnosis not present

## 2024-08-04 DIAGNOSIS — Z95810 Presence of automatic (implantable) cardiac defibrillator: Secondary | ICD-10-CM | POA: Insufficient documentation

## 2024-08-04 DIAGNOSIS — G4733 Obstructive sleep apnea (adult) (pediatric): Secondary | ICD-10-CM | POA: Diagnosis not present

## 2024-08-04 DIAGNOSIS — Z955 Presence of coronary angioplasty implant and graft: Secondary | ICD-10-CM | POA: Diagnosis not present

## 2024-08-04 DIAGNOSIS — I10 Essential (primary) hypertension: Secondary | ICD-10-CM

## 2024-08-04 DIAGNOSIS — Z7984 Long term (current) use of oral hypoglycemic drugs: Secondary | ICD-10-CM | POA: Insufficient documentation

## 2024-08-04 DIAGNOSIS — I252 Old myocardial infarction: Secondary | ICD-10-CM | POA: Diagnosis not present

## 2024-08-04 DIAGNOSIS — I2510 Atherosclerotic heart disease of native coronary artery without angina pectoris: Secondary | ICD-10-CM | POA: Insufficient documentation

## 2024-08-04 DIAGNOSIS — Z7902 Long term (current) use of antithrombotics/antiplatelets: Secondary | ICD-10-CM | POA: Diagnosis not present

## 2024-08-04 LAB — BASIC METABOLIC PANEL WITH GFR
Anion gap: 11 (ref 5–15)
BUN: 6 mg/dL (ref 6–20)
CO2: 19 mmol/L — ABNORMAL LOW (ref 22–32)
Calcium: 8.6 mg/dL — ABNORMAL LOW (ref 8.9–10.3)
Chloride: 106 mmol/L (ref 98–111)
Creatinine, Ser: 0.87 mg/dL (ref 0.61–1.24)
GFR, Estimated: >60 mL/min (ref >60)
Glucose, Bld: 99 mg/dL (ref 70–99)
Potassium: 3.4 mmol/L — ABNORMAL LOW (ref 3.5–5.1)
Sodium: 136 mmol/L (ref 135–145)

## 2024-08-04 LAB — BRAIN NATRIURETIC PEPTIDE: B Natriuretic Peptide: 36.1 pg/mL (ref 0.0–100.0)

## 2024-08-04 MED ORDER — TORSEMIDE 20 MG PO TABS
20.0000 mg | ORAL_TABLET | Freq: Every day | ORAL | 5 refills | Status: AC
  Filled 2024-08-04: qty 30, 30d supply, fill #0

## 2024-08-04 MED ORDER — POTASSIUM CHLORIDE CRYS ER 20 MEQ PO TBCR
20.0000 meq | EXTENDED_RELEASE_TABLET | Freq: Every day | ORAL | 5 refills | Status: DC
  Filled 2024-08-04 (×2): qty 30, 30d supply, fill #0

## 2024-08-04 NOTE — Patient Instructions (Signed)
 Medication Changes:  INCREASE TORSEMIDE  TO 20MG  ONCE DAILY   INCREASE POTASSIUM TO ONCE DAILY   Lab Work:  Labs done today, your results will be available in MyChart, we will contact you for abnormal readings.  AND THEN LABS AGAIN IN 10-14 DAYS AS SCHEDULED   Follow-Up in: 3 MONTHS AS SCHEDULED WITH APP CLINIC   At the [facility], you and your health needs are our priority. We have a designated team specialized in the treatment of Heart Failure. This Care Team includes your primary Heart Failure Specialized Cardiologist (physician), Advanced Practice Providers (APPs- Physician Assistants and Nurse Practitioners), and Pharmacist who all work together to provide you with the care you need, when you need it.   You may see any of the following providers on your designated Care Team at your next follow up:  Dr. Toribio Fuel Dr. Ezra Shuck Dr. Ria Commander Dr. Odis Brownie Greig Mosses, NP Caffie Shed, [place] [facility], [place] Beckey Coe, NP [place] Lee, NP Tinnie Redman, PharmD   Please be sure to bring in all your medications bottles to every appointment.   Need to Contact Us :  If you have any questions or concerns before your next appointment please send us  a message through [place] or call our office at [phone].    TO LEAVE A MESSAGE FOR THE NURSE SELECT OPTION 2, PLEASE LEAVE A MESSAGE INCLUDING: YOUR NAME DATE OF BIRTH CALL BACK NUMBER REASON FOR CALL**this is important as we prioritize the call backs  YOU WILL RECEIVE A CALL BACK THE SAME DAY AS LONG AS YOU CALL BEFORE 4:00 PM

## 2024-08-04 NOTE — Progress Notes (Signed)
 ReDS Vest / Clip - 08/04/24 0900       ReDS Vest / Clip   Station Marker D    Ruler Value 39    ReDS Value Range Moderate volume overload    ReDS Actual Value 39

## 2024-08-05 ENCOUNTER — Other Ambulatory Visit (HOSPITAL_COMMUNITY): Payer: Self-pay

## 2024-08-05 MED ORDER — POTASSIUM CHLORIDE CRYS ER 10 MEQ PO TBCR
20.0000 meq | EXTENDED_RELEASE_TABLET | Freq: Every day | ORAL | 3 refills | Status: AC
  Filled 2024-08-05: qty 60, 30d supply, fill #0

## 2024-08-05 NOTE — Telephone Encounter (Addendum)
 Pt aware, agreeable, and verbalized understanding  Rx updated   ----- Message from Harlene CHRISTELLA Gainer sent at 08/04/2024 11:56 AM EDT ----- K is low. Needs to be more consistent with taking KCL supplement.  Can change his KCL to 10 meQ tablets (will need 2 tabs daily), if insurance will cover ----- Message ----- From: Interface, Lab In [place] Sent: 08/04/2024  11:10 AM EDT To: Harlene CHRISTELLA Gainer, FNP

## 2024-08-11 ENCOUNTER — Ambulatory Visit: Attending: Internal Medicine

## 2024-08-11 DIAGNOSIS — I5022 Chronic systolic (congestive) heart failure: Secondary | ICD-10-CM | POA: Diagnosis not present

## 2024-08-11 DIAGNOSIS — Z95810 Presence of automatic (implantable) cardiac defibrillator: Secondary | ICD-10-CM

## 2024-08-11 NOTE — Progress Notes (Signed)
  Received: Today [place], Harlene HERO, FNP  Samyiah Halvorsen, Mitzie RAMAN, RN No changes, thank you!

## 2024-08-11 NOTE — Progress Notes (Signed)
 EPIC Encounter for ICM Monitoring  Patient Name: [name] is a 43 y.o. male Date: 08/11/2024 Primary Care Physican: Colin Aldona CROME, NP Primary Cardiologist: [name] Electrophysiologist: [name] 08/04/2024 Office Weight:  258.6 lbs    HF clinic check as requested by Colin Gainer, NP.  Heart Failure questions reviewed, pt asymptomatic for fluid accumulations but does have respiratory/sinus congestion.  He asked if he should continue taking Torsemide  daily.  Advised to continue with current dosage and if any changes will call back.     HeartLogic Heart Failure Index decreased from 15 to 7 suggesting fluid levels are within normal threshold range after Torsemide  increased to daily at 08/04/2024 HF office visit.  Prescribed:  Torsemide  20 mg take 1 tablet(s) (20 mg total) by mouth daily. (Increased 08/04/2024) Potassium 10 mEq take 2 tablet(s) (total 20 mEq total) by mouth daily Spironolactone  25 mg take 1 tablet (25 mg total)  by mouth at bedtime.   Labs: 08/04/2024 Creatinine 0.87, BUN 6, Potassium 3.4, Sodium 136, GFR >60  05/04/2024 Creatinine 0.98, BUN 11, Potassium 4.3, Sodium 137, GFR >60  A complete set of results can be found in Results Review.  Recommendations:  No changes and encouraged to call if experiencing any fluid symptoms.  Copy sent to Colin Gainer, NP for review following Torsemide  dosage change and 08/04/2024 office visit.   Follow-up plan: No further ICM clinic phone appointments.   91 day device clinic remote transmission 10/05/2024.             EP/Cardiology next office visit: 11/04/2024 with HF clinic.         Copy of ICM check sent to Dr. [name].  Remote Monitoring Medically Necessary for Heart Failure Management.  3 Month HeartLogicT Heart Failure Index:    8 Day Data Trend:          Colin GORMAN Garner, RN 08/11/2024 7:21 AM

## 2024-08-13 ENCOUNTER — Encounter: Payer: Self-pay | Admitting: Family Medicine

## 2024-08-14 ENCOUNTER — Ambulatory Visit (HOSPITAL_COMMUNITY)
Admission: RE | Admit: 2024-08-14 | Discharge: 2024-08-14 | Disposition: A | Discharge status: Home / Self Care | Source: Ambulatory Visit | Attending: Cardiology | Admitting: Cardiology

## 2024-08-14 ENCOUNTER — Other Ambulatory Visit (HOSPITAL_COMMUNITY): Payer: Self-pay

## 2024-08-14 DIAGNOSIS — I5022 Chronic systolic (congestive) heart failure: Secondary | ICD-10-CM | POA: Insufficient documentation

## 2024-08-14 LAB — BASIC METABOLIC PANEL WITH GFR
Anion gap: 11 (ref 5–15)
BUN: 9 mg/dL (ref 6–20)
CO2: 21 mmol/L — ABNORMAL LOW (ref 22–32)
Calcium: 8.8 mg/dL — ABNORMAL LOW (ref 8.9–10.3)
Chloride: 104 mmol/L (ref 98–111)
Creatinine, Ser: 0.94 mg/dL (ref 0.61–1.24)
GFR, Estimated: >60 mL/min (ref >60)
Glucose, Bld: 111 mg/dL — ABNORMAL HIGH (ref 70–99)
Potassium: 3.6 mmol/L (ref 3.5–5.1)
Sodium: 136 mmol/L (ref 135–145)

## 2024-08-17 ENCOUNTER — Encounter: Payer: Self-pay | Admitting: Radiology

## 2024-09-28 ENCOUNTER — Encounter

## 2024-10-05 ENCOUNTER — Ambulatory Visit

## 2024-10-05 DIAGNOSIS — I5022 Chronic systolic (congestive) heart failure: Secondary | ICD-10-CM

## 2024-10-06 LAB — CUP PACEART REMOTE DEVICE CHECK
Battery Remaining Longevity: 144 mo
Battery Remaining Percentage: 100 %
Brady Statistic RV Percent Paced: 0 %
Date Time Interrogation Session: 20251222000100
HighPow Impedance: 82 Ohm
Implantable Lead Connection Status: 753985
Implantable Lead Implant Date: 20220909
Implantable Lead Location: 753860
Implantable Lead Model: 137
Implantable Lead Serial Number: 301248
Implantable Pulse Generator Implant Date: 20220909
Lead Channel Impedance Value: 697 Ohm
Lead Channel Pacing Threshold Amplitude: 1 V
Lead Channel Pacing Threshold Pulse Width: 0.4 ms
Lead Channel Setting Pacing Amplitude: 3.5 V
Lead Channel Setting Pacing Pulse Width: 0.4 ms
Lead Channel Setting Sensing Sensitivity: 0.6 mV
Pulse Gen Serial Number: 213174
Zone Setting Status: 755011

## 2024-10-07 NOTE — Progress Notes (Signed)
 Remote ICD Transmission

## 2024-10-09 ENCOUNTER — Ambulatory Visit: Payer: Self-pay | Admitting: Student in an Organized Health Care Education/Training Program

## 2024-10-19 ENCOUNTER — Other Ambulatory Visit: Payer: Self-pay

## 2024-10-19 ENCOUNTER — Other Ambulatory Visit (HOSPITAL_COMMUNITY): Payer: Self-pay

## 2024-11-03 ENCOUNTER — Telehealth (HOSPITAL_COMMUNITY): Payer: Self-pay
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
## 2024-11-03 NOTE — Telephone Encounter (Signed)
 Called to confirm/remind patient of their appointment at the [facility] on 11/04/24.   Appointment:   [x] Confirmed  [] Left mess   [] No answer/No voice mail  [] VM Full/unable to leave message  [] Phone not in service  Patient reminded to bring all medications and/or complete list.  Confirmed patient has transportation. Gave directions, instructed to utilize valet parking.

## 2024-11-04 ENCOUNTER — Other Ambulatory Visit (HOSPITAL_COMMUNITY): Payer: Self-pay

## 2024-11-04 ENCOUNTER — Ambulatory Visit (HOSPITAL_COMMUNITY)
Admission: RE | Admit: 2024-11-04 | Discharge: 2024-11-04 | Disposition: A | Discharge status: Home / Self Care | Source: Ambulatory Visit | Attending: Family Medicine | Admitting: Family Medicine

## 2024-11-04 ENCOUNTER — Encounter (HOSPITAL_COMMUNITY): Payer: Self-pay

## 2024-11-04 ENCOUNTER — Ambulatory Visit (HOSPITAL_COMMUNITY): Payer: Self-pay | Admitting: Family Medicine

## 2024-11-04 VITALS — BP 126/82 | HR 66 | Ht 66.0 in | Wt 247.6 lb

## 2024-11-04 DIAGNOSIS — Z95810 Presence of automatic (implantable) cardiac defibrillator: Secondary | ICD-10-CM | POA: Insufficient documentation

## 2024-11-04 DIAGNOSIS — Z951 Presence of aortocoronary bypass graft: Secondary | ICD-10-CM | POA: Insufficient documentation

## 2024-11-04 DIAGNOSIS — Z79899 Other long term (current) drug therapy: Secondary | ICD-10-CM | POA: Diagnosis not present

## 2024-11-04 DIAGNOSIS — I110 Hypertensive heart disease with heart failure: Secondary | ICD-10-CM | POA: Insufficient documentation

## 2024-11-04 DIAGNOSIS — I2510 Atherosclerotic heart disease of native coronary artery without angina pectoris: Secondary | ICD-10-CM | POA: Diagnosis not present

## 2024-11-04 DIAGNOSIS — G4733 Obstructive sleep apnea (adult) (pediatric): Secondary | ICD-10-CM | POA: Insufficient documentation

## 2024-11-04 DIAGNOSIS — I252 Old myocardial infarction: Secondary | ICD-10-CM | POA: Insufficient documentation

## 2024-11-04 DIAGNOSIS — Z955 Presence of coronary angioplasty implant and graft: Secondary | ICD-10-CM | POA: Insufficient documentation

## 2024-11-04 DIAGNOSIS — I255 Ischemic cardiomyopathy: Secondary | ICD-10-CM | POA: Insufficient documentation

## 2024-11-04 DIAGNOSIS — Z7902 Long term (current) use of antithrombotics/antiplatelets: Secondary | ICD-10-CM | POA: Diagnosis not present

## 2024-11-04 DIAGNOSIS — I5022 Chronic systolic (congestive) heart failure: Secondary | ICD-10-CM | POA: Insufficient documentation

## 2024-11-04 DIAGNOSIS — I10 Essential (primary) hypertension: Secondary | ICD-10-CM | POA: Diagnosis not present

## 2024-11-04 DIAGNOSIS — Z7984 Long term (current) use of oral hypoglycemic drugs: Secondary | ICD-10-CM | POA: Diagnosis not present

## 2024-11-04 DIAGNOSIS — Z87891 Personal history of nicotine dependence: Secondary | ICD-10-CM | POA: Insufficient documentation

## 2024-11-04 LAB — BASIC METABOLIC PANEL WITH GFR
Anion gap: 13 (ref 5–15)
BUN: 17 mg/dL (ref 6–20)
CO2: 20 mmol/L — ABNORMAL LOW (ref 22–32)
Calcium: 9.7 mg/dL (ref 8.9–10.3)
Chloride: 101 mmol/L (ref 98–111)
Creatinine, Ser: 1.04 mg/dL (ref 0.61–1.24)
GFR, Estimated: >60 mL/min
Glucose, Bld: 94 mg/dL (ref 70–99)
Potassium: 4.6 mmol/L (ref 3.5–5.1)
Sodium: 133 mmol/L — ABNORMAL LOW (ref 135–145)

## 2024-11-04 LAB — PRO BRAIN NATRIURETIC PEPTIDE: Pro Brain Natriuretic Peptide: 213 pg/mL

## 2024-11-04 LAB — DIGOXIN LEVEL: Digoxin Level: <0.6 ng/mL — ABNORMAL LOW (ref 0.8–2.0)

## 2024-11-04 MED ORDER — SACUBITRIL-VALSARTAN 24-26 MG PO TABS
1.0000 | ORAL_TABLET | Freq: Two times a day (BID) | ORAL | 11 refills | Status: AC
  Filled 2024-11-04: qty 60, 30d supply, fill #0

## 2024-11-04 NOTE — Patient Instructions (Addendum)
 Thank you for coming in today  If you had labs drawn today, any labs that are abnormal the clinic will call you No news is good news  You will be called and scheduled for a CPX test   Medications: Decrease Entresto  to 24/26 1 tablet twice daily Restart Vascepa  2 tabs twice a day  Follow up appointments:  Your physician recommends that you schedule a follow-up appointment in:  3 months With Dr. Mclean.    Do the following things EVERYDAY: Weigh yourself in the morning before breakfast. Write it down and keep it in a log. Take your medicines as prescribed Eat low salt foods--Limit salt (sodium) to 2000 mg per day.  Stay as active as you can everyday Limit all fluids for the day to less than 2 liters   At the [facility], you and your health needs are our priority. As part of our continuing mission to provide you with exceptional heart care, we have created designated Provider Care Teams. These Care Teams include your primary Cardiologist (physician) and Advanced Practice Providers (APPs- Physician Assistants and Nurse Practitioners) who all work together to provide you with the care you need, when you need it.   You may see any of the following providers on your designated Care Team at your next follow up: Dr Toribio Fuel Dr Ezra Shuck Dr. Ria Gardenia Greig Lenetta, NP Caffie Shed, [place] [facility] [place], [place] Beckey Coe, NP Tinnie Redman, PharmD   Please be sure to bring in all your medications bottles to every appointment.    Thank you for choosing [facility]  If you have any questions or concerns before your next appointment please send us  a message through Arjay or call our office at [phone].    TO LEAVE A MESSAGE FOR THE NURSE SELECT OPTION 2, PLEASE LEAVE A MESSAGE INCLUDING: YOUR NAME DATE OF BIRTH CALL BACK NUMBER REASON FOR CALL**this is important as we prioritize the call  backs  YOU WILL RECEIVE A CALL BACK THE SAME DAY AS LONG AS YOU CALL BEFORE 4:00 PM

## 2024-11-16 ENCOUNTER — Other Ambulatory Visit (HOSPITAL_COMMUNITY): Payer: Self-pay

## 2024-11-17 ENCOUNTER — Telehealth (HOSPITAL_COMMUNITY): Payer: Self-pay

## 2024-11-17 ENCOUNTER — Other Ambulatory Visit (HOSPITAL_COMMUNITY): Payer: Self-pay

## 2024-11-17 NOTE — Telephone Encounter (Signed)
 Advanced Heart Failure Patient Advocate Encounter  This patient was approved for a [employer] grant that will help cover the cost of Carvedilol , Digoxin , Entresto , Farxiga , Spironolactone .  Total amount awarded, $7,500.  Effective: 11/10/2024 - 11/09/2025.  BIN [id number] PCN [id number] [id number] ID [id number]  Approval and processing information added to ALAN Rachel DEL, CPhT Rx Patient Advocate Phone: [phone]

## 2024-12-28 ENCOUNTER — Encounter

## 2025-01-04 ENCOUNTER — Encounter

## 2025-02-08 ENCOUNTER — Ambulatory Visit (HOSPITAL_COMMUNITY): Admitting: Cardiology

## 2025-03-29 ENCOUNTER — Encounter

## 2025-04-05 ENCOUNTER — Encounter

## 2025-06-28 ENCOUNTER — Encounter

## 2025-07-05 ENCOUNTER — Encounter

## 2025-09-27 ENCOUNTER — Encounter

## 2025-10-04 ENCOUNTER — Encounter

## 2025-12-27 ENCOUNTER — Encounter

## 2026-01-03 ENCOUNTER — Encounter

## 2026-03-28 ENCOUNTER — Encounter

## 2026-04-04 ENCOUNTER — Encounter
